# Patient Record
Sex: Male | Born: 1950
Health system: Southern US, Community
[De-identification: ages and names within clinical notes are randomized; demographics above are authoritative.]

## PROBLEM LIST (undated history)

## (undated) DIAGNOSIS — R590 Localized enlarged lymph nodes: Secondary | ICD-10-CM

## (undated) DIAGNOSIS — K219 Gastro-esophageal reflux disease without esophagitis: Secondary | ICD-10-CM

## (undated) DIAGNOSIS — H3320 Serous retinal detachment, unspecified eye: Secondary | ICD-10-CM

## (undated) DIAGNOSIS — I4891 Unspecified atrial fibrillation: Secondary | ICD-10-CM

## (undated) DIAGNOSIS — C8290 Follicular lymphoma, unspecified, unspecified site: Secondary | ICD-10-CM

## (undated) DIAGNOSIS — I10 Essential (primary) hypertension: Secondary | ICD-10-CM

## (undated) DIAGNOSIS — N529 Male erectile dysfunction, unspecified: Secondary | ICD-10-CM

## (undated) HISTORY — DX: Gastro-esophageal reflux disease without esophagitis: K21.9

## (undated) HISTORY — DX: Localized enlarged lymph nodes: R59.0

## (undated) HISTORY — DX: Male erectile dysfunction, unspecified: N52.9

## (undated) HISTORY — DX: Essential (primary) hypertension: I10

## (undated) HISTORY — DX: Serous retinal detachment, unspecified eye: H33.20

## (undated) HISTORY — DX: Follicular lymphoma, unspecified, unspecified site: C82.90

## (undated) HISTORY — PX: OTHER SURGICAL HISTORY: SHX169

## (undated) HISTORY — PX: ABSCESS DRAINAGE: SHX1119

## (undated) HISTORY — DX: Unspecified atrial fibrillation: I48.91

## (undated) HISTORY — PX: VASECTOMY: SHX75

---

## 2000-01-06 ENCOUNTER — Encounter (INDEPENDENT_AMBULATORY_CARE_PROVIDER_SITE_OTHER): Payer: Self-pay | Admitting: Specialist

## 2000-01-06 ENCOUNTER — Other Ambulatory Visit: Admission: RE | Admit: 2000-01-06 | Discharge: 2000-01-06 | Payer: Self-pay | Admitting: Gastroenterology

## 2004-09-17 ENCOUNTER — Ambulatory Visit: Payer: Self-pay | Admitting: Internal Medicine

## 2004-10-13 ENCOUNTER — Ambulatory Visit: Payer: Self-pay | Admitting: Internal Medicine

## 2006-01-02 ENCOUNTER — Ambulatory Visit: Payer: Self-pay | Admitting: Family Medicine

## 2006-01-18 ENCOUNTER — Ambulatory Visit: Payer: Self-pay | Admitting: Family Medicine

## 2006-01-31 ENCOUNTER — Ambulatory Visit: Payer: Self-pay | Admitting: Gastroenterology

## 2006-02-14 ENCOUNTER — Ambulatory Visit: Payer: Self-pay | Admitting: Gastroenterology

## 2006-02-14 ENCOUNTER — Encounter (INDEPENDENT_AMBULATORY_CARE_PROVIDER_SITE_OTHER): Payer: Self-pay | Admitting: *Deleted

## 2006-02-14 LAB — HM COLONOSCOPY

## 2006-03-09 ENCOUNTER — Ambulatory Visit: Payer: Self-pay | Admitting: Internal Medicine

## 2006-04-24 ENCOUNTER — Ambulatory Visit: Payer: Self-pay | Admitting: Family Medicine

## 2006-04-24 LAB — CONVERTED CEMR LAB
CO2: 33 meq/L — ABNORMAL HIGH (ref 19–32)
Calcium: 9.4 mg/dL (ref 8.4–10.5)
Chloride: 104 meq/L (ref 96–112)
Creatinine, Ser: 1 mg/dL (ref 0.4–1.5)
Glucose, Bld: 97 mg/dL (ref 70–99)

## 2006-05-08 ENCOUNTER — Ambulatory Visit: Payer: Self-pay | Admitting: Family Medicine

## 2007-02-27 ENCOUNTER — Encounter (INDEPENDENT_AMBULATORY_CARE_PROVIDER_SITE_OTHER): Payer: Self-pay | Admitting: *Deleted

## 2007-02-27 ENCOUNTER — Ambulatory Visit: Payer: Self-pay | Admitting: Family Medicine

## 2007-02-27 DIAGNOSIS — M25569 Pain in unspecified knee: Secondary | ICD-10-CM | POA: Insufficient documentation

## 2007-02-27 DIAGNOSIS — E1159 Type 2 diabetes mellitus with other circulatory complications: Secondary | ICD-10-CM | POA: Insufficient documentation

## 2007-02-27 DIAGNOSIS — I10 Essential (primary) hypertension: Secondary | ICD-10-CM | POA: Insufficient documentation

## 2007-03-15 ENCOUNTER — Ambulatory Visit: Payer: Self-pay | Admitting: Family Medicine

## 2007-03-15 DIAGNOSIS — F528 Other sexual dysfunction not due to a substance or known physiological condition: Secondary | ICD-10-CM | POA: Insufficient documentation

## 2007-07-25 ENCOUNTER — Ambulatory Visit: Payer: Self-pay | Admitting: Family Medicine

## 2007-07-25 DIAGNOSIS — K219 Gastro-esophageal reflux disease without esophagitis: Secondary | ICD-10-CM | POA: Insufficient documentation

## 2007-07-26 ENCOUNTER — Encounter (INDEPENDENT_AMBULATORY_CARE_PROVIDER_SITE_OTHER): Payer: Self-pay | Admitting: *Deleted

## 2007-07-31 ENCOUNTER — Encounter (INDEPENDENT_AMBULATORY_CARE_PROVIDER_SITE_OTHER): Payer: Self-pay | Admitting: *Deleted

## 2007-08-02 ENCOUNTER — Ambulatory Visit: Payer: Self-pay | Admitting: Family Medicine

## 2007-08-06 ENCOUNTER — Encounter (INDEPENDENT_AMBULATORY_CARE_PROVIDER_SITE_OTHER): Payer: Self-pay | Admitting: *Deleted

## 2008-07-07 ENCOUNTER — Ambulatory Visit: Payer: Self-pay | Admitting: Family Medicine

## 2008-07-07 DIAGNOSIS — R599 Enlarged lymph nodes, unspecified: Secondary | ICD-10-CM | POA: Insufficient documentation

## 2008-07-20 LAB — CONVERTED CEMR LAB
Basophils Relative: 0 % (ref 0.0–3.0)
Eosinophils Absolute: 0.3 10*3/uL (ref 0.0–0.7)
Eosinophils Relative: 5.1 % — ABNORMAL HIGH (ref 0.0–5.0)
HCT: 43.1 % (ref 39.0–52.0)
Hemoglobin: 14.9 g/dL (ref 13.0–17.0)
MCV: 91 fL (ref 78.0–100.0)
Monocytes Absolute: 1.1 10*3/uL — ABNORMAL HIGH (ref 0.1–1.0)
Monocytes Relative: 17.7 % — ABNORMAL HIGH (ref 3.0–12.0)
Neutro Abs: 2.3 10*3/uL (ref 1.4–7.7)
Platelets: 265 10*3/uL (ref 150–400)
RBC: 4.74 M/uL (ref 4.22–5.81)
WBC: 6.3 10*3/uL (ref 4.5–10.5)

## 2008-07-21 ENCOUNTER — Ambulatory Visit: Payer: Self-pay | Admitting: Family Medicine

## 2008-07-21 ENCOUNTER — Encounter (INDEPENDENT_AMBULATORY_CARE_PROVIDER_SITE_OTHER): Payer: Self-pay | Admitting: *Deleted

## 2008-07-21 ENCOUNTER — Ambulatory Visit: Payer: Self-pay | Admitting: Cardiology

## 2008-07-21 ENCOUNTER — Telehealth (INDEPENDENT_AMBULATORY_CARE_PROVIDER_SITE_OTHER): Payer: Self-pay | Admitting: *Deleted

## 2008-07-21 LAB — CONVERTED CEMR LAB
BUN: 14 mg/dL (ref 6–23)
Creatinine, Ser: 0.8 mg/dL (ref 0.4–1.5)

## 2008-08-01 ENCOUNTER — Encounter: Payer: Self-pay | Admitting: Family Medicine

## 2008-08-01 ENCOUNTER — Ambulatory Visit (HOSPITAL_COMMUNITY): Admission: RE | Admit: 2008-08-01 | Discharge: 2008-08-01 | Payer: Self-pay | Admitting: Family Medicine

## 2008-08-07 ENCOUNTER — Telehealth: Payer: Self-pay | Admitting: Family Medicine

## 2008-08-07 DIAGNOSIS — C8590 Non-Hodgkin lymphoma, unspecified, unspecified site: Secondary | ICD-10-CM | POA: Insufficient documentation

## 2008-08-13 ENCOUNTER — Ambulatory Visit: Payer: Self-pay | Admitting: Oncology

## 2008-08-15 ENCOUNTER — Encounter: Payer: Self-pay | Admitting: Family Medicine

## 2008-08-15 LAB — CBC WITH DIFFERENTIAL/PLATELET
Eosinophils Absolute: 0.2 10*3/uL (ref 0.0–0.5)
LYMPH%: 22.5 % (ref 14.0–49.0)
MCHC: 35.3 g/dL (ref 32.0–36.0)
MCV: 84.7 fL (ref 79.3–98.0)
MONO%: 13.5 % (ref 0.0–14.0)
NEUT#: 4.6 10*3/uL (ref 1.5–6.5)
NEUT%: 60.1 % (ref 39.0–75.0)
Platelets: 264 10*3/uL (ref 140–400)
RBC: 5.02 10*6/uL (ref 4.20–5.82)

## 2008-08-19 LAB — SPEP & IFE WITH QIG
Albumin ELP: 61 % (ref 55.8–66.1)
Alpha-1-Globulin: 4.1 % (ref 2.9–4.9)
Alpha-2-Globulin: 10.1 % (ref 7.1–11.8)
Beta Globulin: 5.6 % (ref 4.7–7.2)
Total Protein, Serum Electrophoresis: 6.7 g/dL (ref 6.0–8.3)

## 2008-08-19 LAB — KAPPA/LAMBDA LIGHT CHAINS: Kappa free light chain: 0.64 mg/dL (ref 0.33–1.94)

## 2008-08-19 LAB — COMPREHENSIVE METABOLIC PANEL
Alkaline Phosphatase: 71 U/L (ref 39–117)
Creatinine, Ser: 0.93 mg/dL (ref 0.40–1.50)
Glucose, Bld: 100 mg/dL — ABNORMAL HIGH (ref 70–99)
Sodium: 142 mEq/L (ref 135–145)
Total Bilirubin: 0.5 mg/dL (ref 0.3–1.2)
Total Protein: 6.7 g/dL (ref 6.0–8.3)

## 2008-08-20 ENCOUNTER — Ambulatory Visit (HOSPITAL_COMMUNITY): Admission: RE | Admit: 2008-08-20 | Discharge: 2008-08-20 | Payer: Self-pay | Admitting: Oncology

## 2008-08-26 ENCOUNTER — Encounter: Payer: Self-pay | Admitting: Family Medicine

## 2008-09-05 ENCOUNTER — Encounter: Payer: Self-pay | Admitting: Family Medicine

## 2008-09-09 ENCOUNTER — Encounter (INDEPENDENT_AMBULATORY_CARE_PROVIDER_SITE_OTHER): Payer: Self-pay | Admitting: *Deleted

## 2008-09-09 ENCOUNTER — Telehealth: Payer: Self-pay | Admitting: Family Medicine

## 2008-09-09 ENCOUNTER — Encounter: Payer: Self-pay | Admitting: Family Medicine

## 2008-09-09 DIAGNOSIS — I4891 Unspecified atrial fibrillation: Secondary | ICD-10-CM | POA: Insufficient documentation

## 2008-09-10 ENCOUNTER — Ambulatory Visit: Payer: Self-pay | Admitting: Family Medicine

## 2008-09-12 ENCOUNTER — Encounter (INDEPENDENT_AMBULATORY_CARE_PROVIDER_SITE_OTHER): Payer: Self-pay | Admitting: *Deleted

## 2008-09-17 ENCOUNTER — Ambulatory Visit: Payer: Self-pay | Admitting: Internal Medicine

## 2008-09-17 ENCOUNTER — Encounter: Payer: Self-pay | Admitting: Internal Medicine

## 2008-09-17 ENCOUNTER — Encounter (INDEPENDENT_AMBULATORY_CARE_PROVIDER_SITE_OTHER): Payer: Self-pay | Admitting: *Deleted

## 2008-09-19 ENCOUNTER — Encounter: Payer: Self-pay | Admitting: Family Medicine

## 2008-09-19 ENCOUNTER — Ambulatory Visit: Payer: Self-pay | Admitting: Oncology

## 2008-09-19 ENCOUNTER — Encounter: Payer: Self-pay | Admitting: Internal Medicine

## 2008-09-23 ENCOUNTER — Telehealth (INDEPENDENT_AMBULATORY_CARE_PROVIDER_SITE_OTHER): Payer: Self-pay | Admitting: *Deleted

## 2008-09-29 ENCOUNTER — Telehealth: Payer: Self-pay | Admitting: Internal Medicine

## 2008-10-01 ENCOUNTER — Encounter (INDEPENDENT_AMBULATORY_CARE_PROVIDER_SITE_OTHER): Payer: Self-pay | Admitting: *Deleted

## 2008-10-01 ENCOUNTER — Encounter: Payer: Self-pay | Admitting: Family Medicine

## 2008-10-03 ENCOUNTER — Ambulatory Visit: Payer: Self-pay

## 2008-10-03 ENCOUNTER — Encounter: Payer: Self-pay | Admitting: Internal Medicine

## 2008-10-14 ENCOUNTER — Encounter: Payer: Self-pay | Admitting: Family Medicine

## 2008-10-15 ENCOUNTER — Telehealth: Payer: Self-pay | Admitting: Internal Medicine

## 2008-10-22 ENCOUNTER — Ambulatory Visit: Payer: Self-pay | Admitting: Internal Medicine

## 2008-10-22 ENCOUNTER — Encounter (INDEPENDENT_AMBULATORY_CARE_PROVIDER_SITE_OTHER): Payer: Self-pay | Admitting: *Deleted

## 2008-11-20 ENCOUNTER — Ambulatory Visit: Payer: Self-pay | Admitting: Internal Medicine

## 2009-01-09 ENCOUNTER — Ambulatory Visit: Payer: Self-pay | Admitting: Oncology

## 2009-01-13 ENCOUNTER — Encounter: Payer: Self-pay | Admitting: Family Medicine

## 2009-01-13 ENCOUNTER — Encounter: Payer: Self-pay | Admitting: Internal Medicine

## 2009-01-13 LAB — COMPREHENSIVE METABOLIC PANEL
AST: 19 U/L (ref 0–37)
Albumin: 4.4 g/dL (ref 3.5–5.2)
Alkaline Phosphatase: 62 U/L (ref 39–117)
Potassium: 4.1 mEq/L (ref 3.5–5.3)
Sodium: 139 mEq/L (ref 135–145)
Total Protein: 7 g/dL (ref 6.0–8.3)

## 2009-01-13 LAB — CBC WITH DIFFERENTIAL/PLATELET
EOS%: 2 % (ref 0.0–7.0)
MCH: 31.3 pg (ref 27.2–33.4)
MCV: 89.7 fL (ref 79.3–98.0)
MONO%: 12.8 % (ref 0.0–14.0)
RBC: 4.73 10*6/uL (ref 4.20–5.82)
RDW: 13.4 % (ref 11.0–14.6)

## 2009-04-13 ENCOUNTER — Ambulatory Visit: Payer: Self-pay | Admitting: Internal Medicine

## 2009-05-13 ENCOUNTER — Ambulatory Visit: Payer: Self-pay | Admitting: Oncology

## 2009-05-15 ENCOUNTER — Encounter: Payer: Self-pay | Admitting: Internal Medicine

## 2009-05-15 LAB — CBC WITH DIFFERENTIAL/PLATELET
Basophils Absolute: 0.1 10*3/uL (ref 0.0–0.1)
EOS%: 2.9 % (ref 0.0–7.0)
Eosinophils Absolute: 0.2 10*3/uL (ref 0.0–0.5)
HGB: 14.4 g/dL (ref 13.0–17.1)
NEUT#: 4.9 10*3/uL (ref 1.5–6.5)
RDW: 13.1 % (ref 11.0–14.6)
lymph#: 1.9 10*3/uL (ref 0.9–3.3)

## 2009-05-15 LAB — COMPREHENSIVE METABOLIC PANEL
AST: 19 U/L (ref 0–37)
Albumin: 4.4 g/dL (ref 3.5–5.2)
BUN: 18 mg/dL (ref 6–23)
Calcium: 9.9 mg/dL (ref 8.4–10.5)
Chloride: 102 mEq/L (ref 96–112)
Glucose, Bld: 97 mg/dL (ref 70–99)
Potassium: 4.5 mEq/L (ref 3.5–5.3)
Sodium: 140 mEq/L (ref 135–145)
Total Protein: 6.9 g/dL (ref 6.0–8.3)

## 2009-05-30 HISTORY — PX: DEEP NECK LYMPH NODE BIOPSY / EXCISION: SUR126

## 2009-07-10 ENCOUNTER — Ambulatory Visit: Payer: Self-pay | Admitting: Internal Medicine

## 2009-07-17 ENCOUNTER — Encounter (INDEPENDENT_AMBULATORY_CARE_PROVIDER_SITE_OTHER): Payer: Self-pay | Admitting: *Deleted

## 2009-08-03 ENCOUNTER — Encounter (INDEPENDENT_AMBULATORY_CARE_PROVIDER_SITE_OTHER): Payer: Self-pay | Admitting: *Deleted

## 2009-08-03 ENCOUNTER — Ambulatory Visit: Payer: Self-pay | Admitting: Family Medicine

## 2009-08-04 ENCOUNTER — Encounter: Payer: Self-pay | Admitting: Family Medicine

## 2009-08-17 ENCOUNTER — Ambulatory Visit: Payer: Self-pay | Admitting: Family Medicine

## 2009-08-28 ENCOUNTER — Ambulatory Visit: Payer: Self-pay | Admitting: Oncology

## 2009-09-01 ENCOUNTER — Ambulatory Visit (HOSPITAL_COMMUNITY): Admission: RE | Admit: 2009-09-01 | Discharge: 2009-09-01 | Payer: Self-pay | Admitting: Oncology

## 2009-09-01 LAB — CBC WITH DIFFERENTIAL/PLATELET
BASO%: 0.4 % (ref 0.0–2.0)
EOS%: 3.3 % (ref 0.0–7.0)
HCT: 43.9 % (ref 38.4–49.9)
LYMPH%: 20 % (ref 14.0–49.0)
MCH: 31.4 pg (ref 27.2–33.4)
MCHC: 34.7 g/dL (ref 32.0–36.0)
MONO%: 12.7 % (ref 0.0–14.0)
NEUT%: 63.6 % (ref 39.0–75.0)
Platelets: 248 10*3/uL (ref 140–400)

## 2009-09-02 LAB — COMPREHENSIVE METABOLIC PANEL
ALT: 26 U/L (ref 0–53)
AST: 26 U/L (ref 0–37)
Alkaline Phosphatase: 60 U/L (ref 39–117)
Creatinine, Ser: 1.04 mg/dL (ref 0.40–1.50)
Total Bilirubin: 0.8 mg/dL (ref 0.3–1.2)

## 2009-09-04 ENCOUNTER — Encounter: Payer: Self-pay | Admitting: Family Medicine

## 2009-10-07 ENCOUNTER — Ambulatory Visit: Payer: Self-pay | Admitting: Internal Medicine

## 2009-10-08 ENCOUNTER — Telehealth: Payer: Self-pay | Admitting: Internal Medicine

## 2009-12-07 ENCOUNTER — Ambulatory Visit: Payer: Self-pay | Admitting: Oncology

## 2009-12-09 ENCOUNTER — Encounter: Payer: Self-pay | Admitting: Family Medicine

## 2009-12-09 LAB — CBC WITH DIFFERENTIAL/PLATELET
BASO%: 0.5 % (ref 0.0–2.0)
LYMPH%: 20 % (ref 14.0–49.0)
MCHC: 34.8 g/dL (ref 32.0–36.0)
MONO#: 1 10*3/uL — ABNORMAL HIGH (ref 0.1–0.9)
MONO%: 12.6 % (ref 0.0–14.0)
Platelets: 259 10*3/uL (ref 140–400)
RBC: 4.6 10*6/uL (ref 4.20–5.82)
RDW: 13.4 % (ref 11.0–14.6)
WBC: 7.6 10*3/uL (ref 4.0–10.3)

## 2009-12-09 LAB — COMPREHENSIVE METABOLIC PANEL
ALT: 22 U/L (ref 0–53)
Alkaline Phosphatase: 55 U/L (ref 39–117)
CO2: 26 mEq/L (ref 19–32)
Potassium: 3.8 mEq/L (ref 3.5–5.3)
Sodium: 138 mEq/L (ref 135–145)
Total Bilirubin: 1.1 mg/dL (ref 0.3–1.2)
Total Protein: 6.7 g/dL (ref 6.0–8.3)

## 2010-04-08 ENCOUNTER — Ambulatory Visit: Payer: Self-pay | Admitting: Oncology

## 2010-04-12 ENCOUNTER — Ambulatory Visit (HOSPITAL_COMMUNITY)
Admission: RE | Admit: 2010-04-12 | Discharge: 2010-04-12 | Payer: Self-pay | Source: Home / Self Care | Admitting: Oncology

## 2010-04-14 ENCOUNTER — Encounter: Payer: Self-pay | Admitting: Internal Medicine

## 2010-04-26 ENCOUNTER — Ambulatory Visit: Payer: Self-pay | Admitting: Internal Medicine

## 2010-04-26 ENCOUNTER — Encounter (INDEPENDENT_AMBULATORY_CARE_PROVIDER_SITE_OTHER): Payer: Self-pay | Admitting: *Deleted

## 2010-06-20 ENCOUNTER — Encounter: Payer: Self-pay | Admitting: Oncology

## 2010-06-27 LAB — CONVERTED CEMR LAB
ALT: 25 units/L (ref 0–53)
ALT: 28 units/L (ref 0–53)
Albumin: 4.1 g/dL (ref 3.5–5.2)
Alkaline Phosphatase: 62 units/L (ref 39–117)
BUN: 13 mg/dL (ref 6–23)
BUN: 15 mg/dL (ref 6–23)
Basophils Absolute: 0.1 10*3/uL (ref 0.0–0.1)
Basophils Absolute: 0.1 10*3/uL (ref 0.0–0.1)
Basophils Relative: 1.2 % — ABNORMAL HIGH (ref 0.0–1.0)
Bilirubin, Direct: 0.1 mg/dL (ref 0.0–0.3)
CO2: 32 meq/L (ref 19–32)
Calcium: 9.5 mg/dL (ref 8.4–10.5)
Cholesterol: 136 mg/dL (ref 0–200)
Creatinine, Ser: 1 mg/dL (ref 0.4–1.5)
Eosinophils Relative: 3.2 % (ref 0.0–5.0)
Free T4: 0.9 ng/dL (ref 0.6–1.6)
GFR calc Af Amer: 113 mL/min
GFR calc non Af Amer: 81.79 mL/min (ref >60)
Glucose, Bld: 109 mg/dL — ABNORMAL HIGH (ref 70–99)
HCT: 45.3 % (ref 39.0–52.0)
HDL: 32.1 mg/dL — ABNORMAL LOW (ref >39.0)
Ketones, urine, test strip: NEGATIVE
LDL Cholesterol: 84 mg/dL (ref 0–99)
LDL Cholesterol: 86 mg/dL (ref 0–99)
Lymphocytes Relative: 23.8 % (ref 12.0–46.0)
Lymphs Abs: 1.5 10*3/uL (ref 0.7–4.0)
MCV: 91.6 fL (ref 78.0–100.0)
Monocytes Absolute: 0.8 10*3/uL (ref 0.1–1.0)
Monocytes Relative: 10.6 % (ref 3.0–12.0)
Monocytes Relative: 11.1 % — ABNORMAL HIGH (ref 3.0–11.0)
Neutro Abs: 4.9 10*3/uL (ref 1.4–7.7)
Neutrophils Relative %: 65.8 % (ref 43.0–77.0)
Nitrite: NEGATIVE
PSA: 0.35 ng/mL (ref 0.10–4.00)
Platelets: 224 10*3/uL (ref 150.0–400.0)
Platelets: 310 10*3/uL (ref 150–400)
RDW: 12.5 % (ref 11.5–14.6)
RDW: 12.7 % (ref 11.5–14.6)
TSH: 1.47 microintl units/mL (ref 0.35–5.50)
Total Bilirubin: 0.4 mg/dL (ref 0.3–1.2)
Total Protein: 7 g/dL (ref 6.0–8.3)
Triglycerides: 53 mg/dL (ref 0.0–149.0)
Triglycerides: 55 mg/dL (ref 0–149)
Urobilinogen, UA: NEGATIVE
VLDL: 10.6 mg/dL (ref 0.0–40.0)
VLDL: 11 mg/dL (ref 0–40)
WBC: 7.7 10*3/uL (ref 4.5–10.5)

## 2010-06-29 IMAGING — CT CT NECK W/ CM
3 of 4 series · 15 of 33 positions shown, 18 images · IV contrast (agent unspecified)
Comparison: None.

CLINICAL DATA: 57-year-old male with cervical lymphadenopathy.
Bilateral angle of mandible soft tissue masses.  No change on
antibiotics.

CT NECK WITH CONTRAST
TECHNIQUE: Multidetector CT imaging of the neck was performed with
intravenous contrast.
Contrast: 80 ml Rmnipaque-GTT.

[Series 2: neck_routine 3.0 · axial · 0.44mm/px · z∈[-232,-58]mm · 7 of 74 slices shown, 9 images]
[im 8/74  soft-tissue]
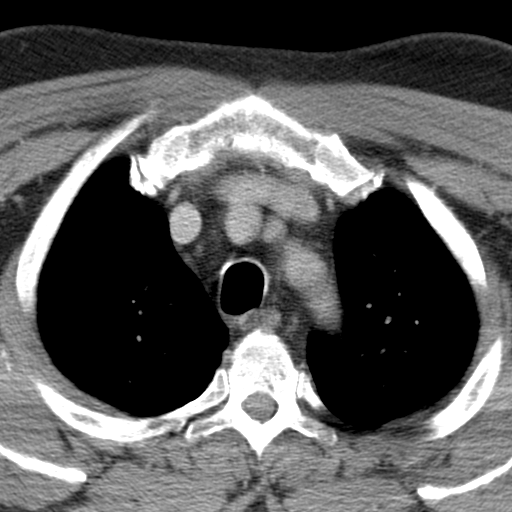
[im 8/74  bone]
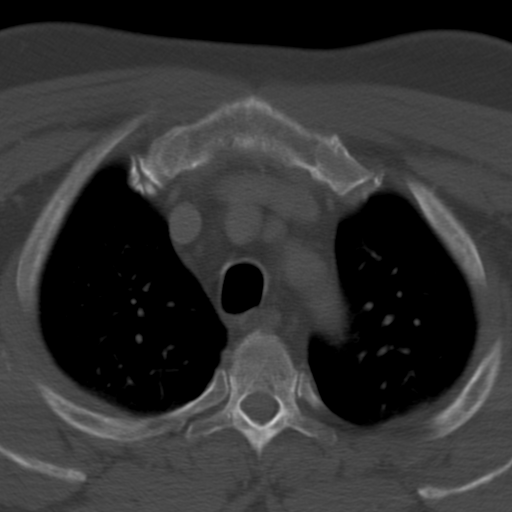
[im 15/74  bone]
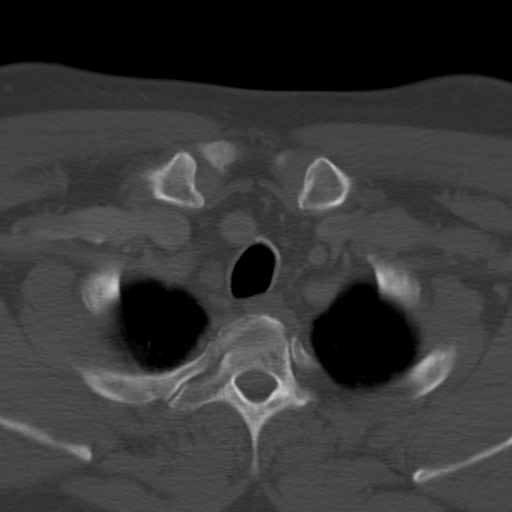
[im 30/74  bone]
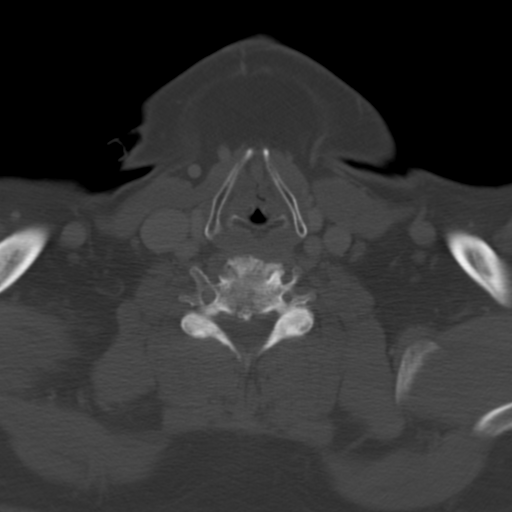
[im 37/74  bone]
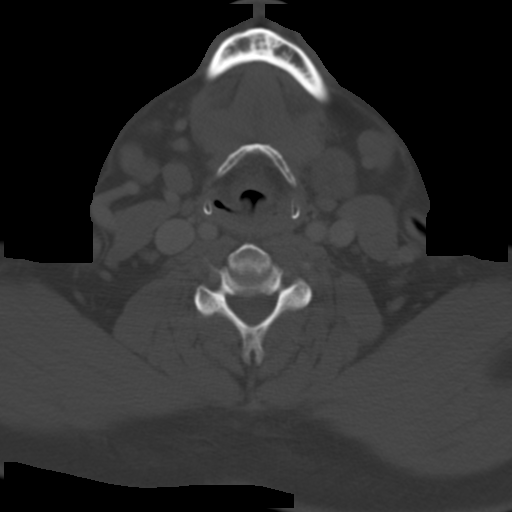
[im 44/74  soft-tissue]
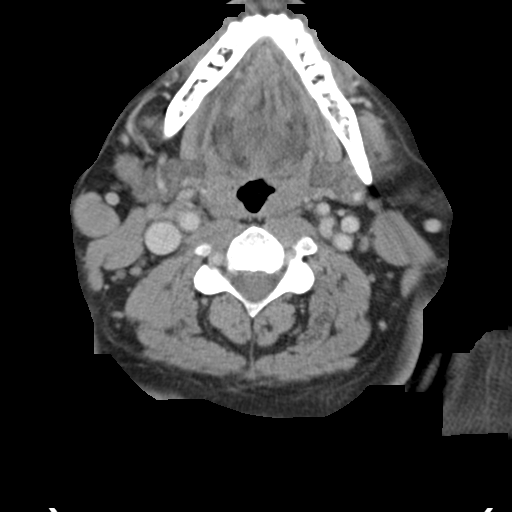
[im 44/74  bone]
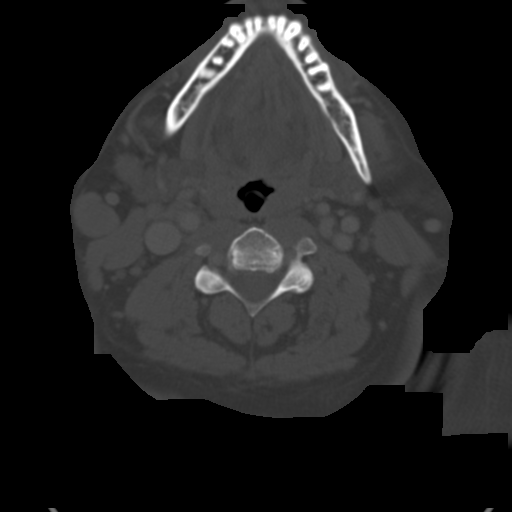
[im 59/74  bone]
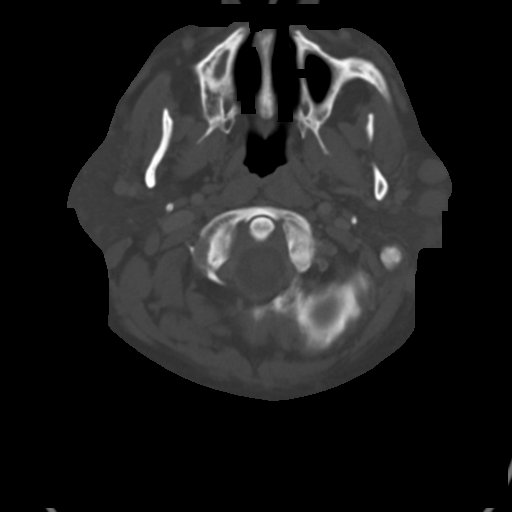
[im 66/74  bone]
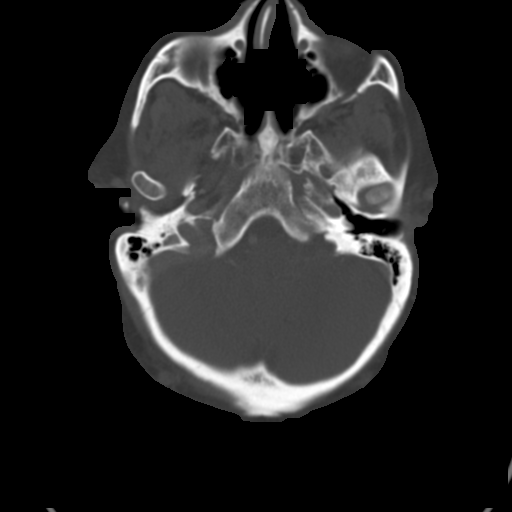

[Series 602: <mpr range> · coronal · 0.44mm/px · 3 of 105 slices shown]
[im 21/105  bone]
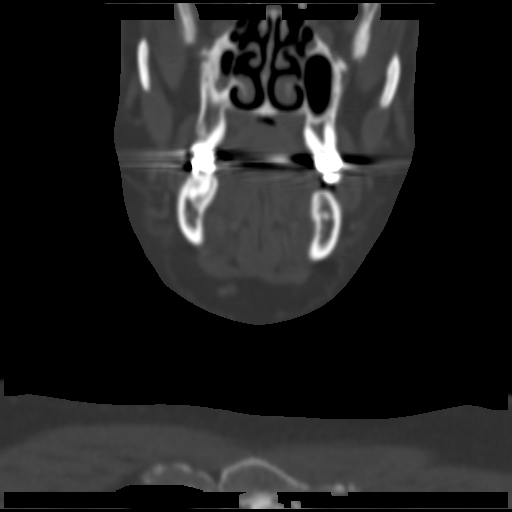
[im 42/105  bone]
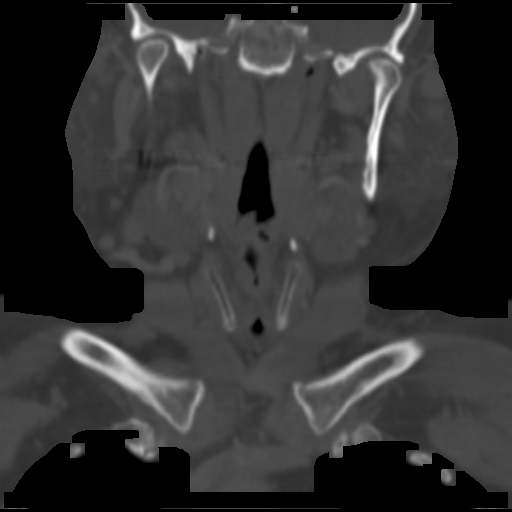
[im 63/105  bone]
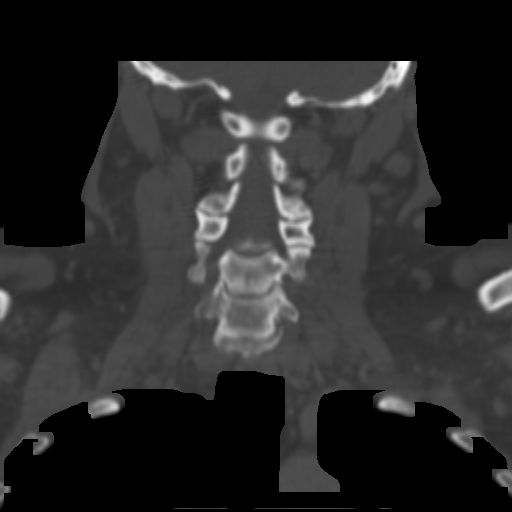

[Series 603: <mpr range(1)> · sagittal · 0.44mm/px · 5 of 109 slices shown, 6 images]
[im 37/109  bone]
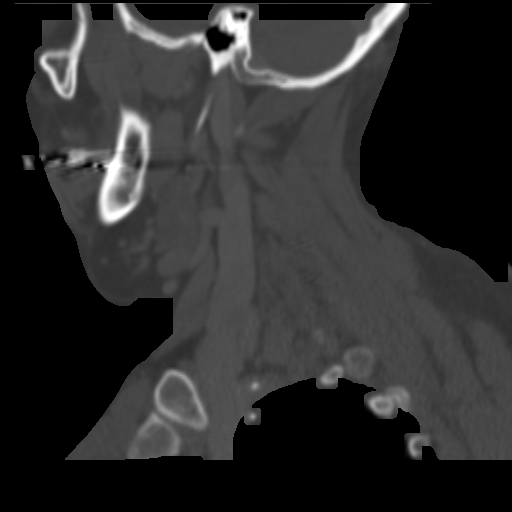
[im 46/109  bone]
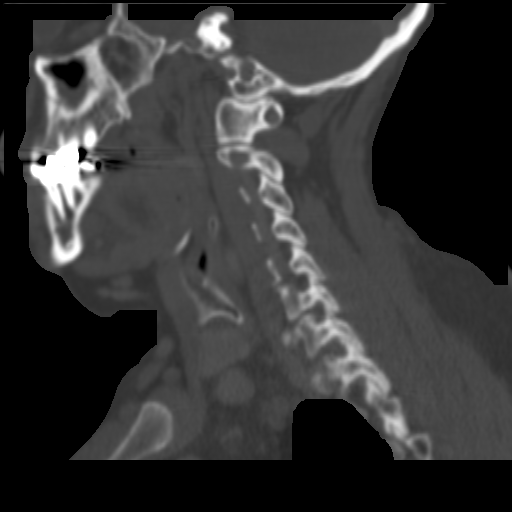
[im 55/109  soft-tissue]
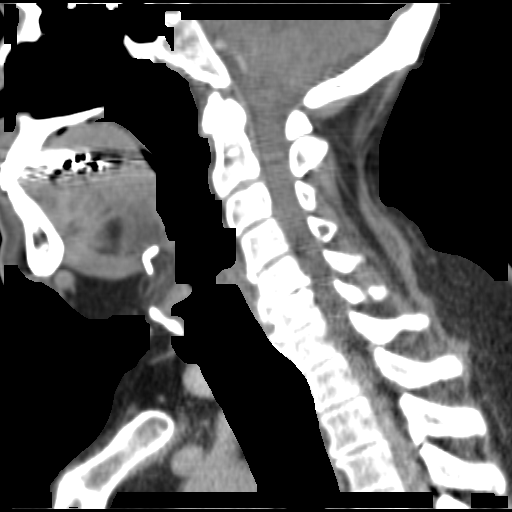
[im 55/109  bone]
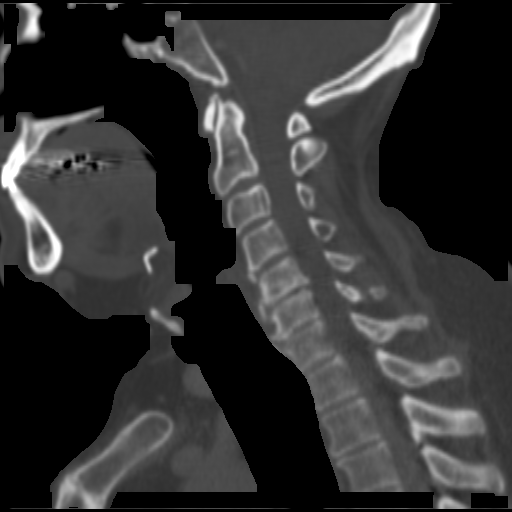
[im 64/109  bone]
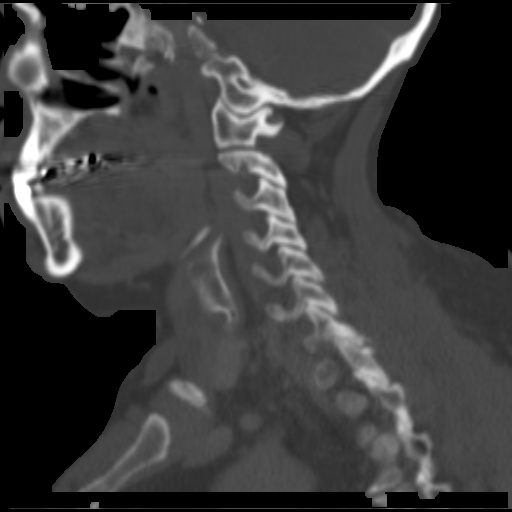
[im 73/109  bone]
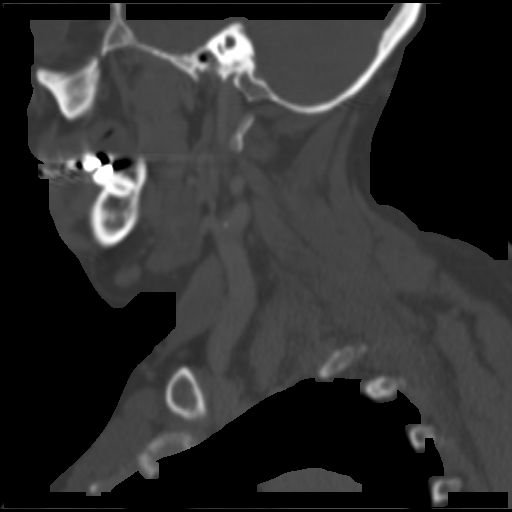

[15 of 33 positions shown; findings below may reference images not displayed]

FINDINGS: Visualized lung apices are clear aside from minor
dependent atelectasis.  Major vascular structures in the neck
appear patent with mild left carotid bifurcation atherosclerosis.
Visualized brain parenchyma, orbits and scalp are within normal
limits.  Multilevel degenerative changes in the cervical spine
including facet, uncovertebral degeneration and multilevel disc
osteophyte complexes.  Mild mucosal thickening in the maxillary
sinuses, right greater than left.  Mucoperiosteal thickening in the
right maxillary sinus which is hypoplastic suggest chronic disease.
Mastoids are clear. No acute osseous abnormality identified.

The right thyroid lobe is within normal limits.  The left lobe near
the isthmus is abnormal with a 12 x 15 mm hypodense nodule.  The
glottis is closed.  The larynx is normal aside from effacement of
the left piriform sinus.  The epiglottis is mildly thickened.
There is bilateral, symmetric tonsillar pillar hypertrophy.  The
sublingual space, retropharyngeal space and bilateral
parapharyngeal spaces are within normal limits.  There is mild
adenoid hypertrophy.  The submandibular glands are within normal
limits.

There is diffuse cervical lymphadenopathy including left greater
than right intraparotid nodes, and the large bilateral level IB and
level II nodes.  The largest nodes measure 16-17 mm in short axis,
with numerous nodes larger than 10 mm identified.  No necrotic
lymphadenopathy.  There is an increased number of these sub
centimeter nodes seen at other nodal stations throughout the neck
and at the thoracic inlet.  There is a maximal right paratracheal
lymph node.  Visualized superior mediastinum is otherwise
relatively spared.  No soft tissue or subcutaneous fat inflammatory
changes.
IMPRESSION: 1.  Diffuse cervical lymphadenopathy with the most pronounced
involvement at level I and II bilaterally as well as left greater
than right intraparotid nodes.
2.  Tonsillar hypertrophy.  Mild thickening of the epiglottis and
effacement of the left piriform sinus without discrete pharyngeal
mass.

3.  Left thyroid nodule measures 12 x 15 mm.
4.  Visualized superior mediastinum and lung apices are within
normal limits.
5.  Constellation of findings is most suggestive of
lymphoma/leukemia (with differential including infection and
metastatic disease).  Lymph node needle biopsy for histologic
correlation is recommended.

I called the above to Chyki Barazorda in Dr. [REDACTED]
by telephone at 2323 hours on 07/21/2008.

## 2010-06-29 NOTE — Progress Notes (Signed)
Summary: refill  Phone Note Call from Patient Call back at Home Phone [phone] Message from:  Patient on Oct 08, 2009 9:29 AM  Refills Requested: Medication #1:  TOPROL XL 50 MG XR24H-TAB one by mouth once daily. [facility]   Caller: Patient Summary of Call: Pt calling reagrdingToprol 50mg  Initial call taken by: Judie Grieve,  Oct 08, 2009 9:30 AM  Follow-up for Phone Call        10/09/09--4:30pm--pt's wife calling stating dr [name] increased toprol succinate to 50mg  once daily--kelly lanier states she thought dr [name] stated to renew meds,so she sent RX for toprol succinate 25mg  (original dose)--i informed wife i would e-scribe the 50mg  toprol succ [facility] [address], but only want pt to take 25mg  toprol succ until they hear from [facility] on mon 5/16--they may cut 50mg  tab in half--dr Martinez Boxx did say in note that he wanted to titrate beta blocker up--kelly states she will f/u on 5/16--nt Follow-up by: Ledon Snare, RN,  Oct 09, 2009 4:33 PM    New/Updated Medications: METOPROLOL SUCCINATE 50 MG XR24H-TAB (METOPROLOL SUCCINATE) Take one tablet by mouth daily Prescriptions: METOPROLOL SUCCINATE 50 MG XR24H-TAB (METOPROLOL SUCCINATE) Take one tablet by mouth daily  #30 x 11   Entered by:   Ledon Snare, RN   Authorized by:   Laren Boom, MD, [facility]   Signed by:   Ledon Snare, RN on 10/09/2009   Method used:   Electronically to        [facility] (retail)       [address]       [place], [place]  [address]       Ph: [phone]       Fax: [phone]   RxID:   [id number]

## 2010-06-29 NOTE — Assessment & Plan Note (Signed)
Summary: 3 MONTH ROV./SL   Primary Provider:  Laury Axon   History of Present Illness: Tom Fuller returns today for followup of his atrial fibrillation.  He is a pleasant 60 yo man with a h/o atrial fibrillation and a low grade lymphoma.  He was seen by me several months ago with newly diagnosed atrial fibrillation as part of a pre-op screening.  He is minimally symptomatic from the perspective of palpitations.  His weakness is improved.  He denies c/p or sob.  He is being managed conservatively for now as his lymphoma is felt to be low grade.  He underwent holter monitoring which demonstrated periods of rapid ventricular response in atrial fibrillation and was started on Toprol.  He denies c/p.  He has minimal dyspnea with exertion.  Current Medications (verified): 1)  Diovan Hct 320-12.5 Mg  Tabs (Valsartan-Hydrochlorothiazide) .Marland Kitchen.. 1 By Mouth Once Daily 2)  Nexium 40 Mg  Cpdr (Esomeprazole Magnesium) .Marland Kitchen.. 1 By Mouth Once Daily 3)  Aspirin Ec 325 Mg Tbec (Aspirin) .... Take One Tablet By Mouth Daily 4)  Fish Oil 1000 Mg  Caps (Omega-3 Fatty Acids) .... 2 By Mouth Two Times A Day 5)  Celebrex 200 Mg Caps (Celecoxib) .... Take One Tablet By Mouth Once Daily. 6)  Toprol Xl 25 Mg Xr24h-Tab (Metoprolol Succinate) .... One By Mouth Once Daily  Allergies (verified): No Known Drug Allergies  Past History:  Past Medical History: Last updated: 10/22/2008 ATRIAL FIBRILLATION (ICD-427.31) LYMPHOMA (ICD-202.80), FOLLICULAR , Dr Clelia Croft CERVICAL LYMPHADENOPATHY [phone]) PREVENTIVE HEALTH CARE (ICD-V70.0) FAMILY HISTORY DIABETES 1ST DEGREE RELATIVE (ICD-V18.0) GERD (ICD-530.81) ERECTILE DYSFUNCTION (ICD-302.72) HYPERTENSION (ICD-401.9) KNEE PAIN, RIGHT, ACUTE (EAV-409.81)  Past Surgical History: Last updated: 08/03/2009 Vasectomy (1985) bx lymph node--  08/2008  Review of Systems  The patient denies chest pain, syncope, dyspnea on exertion, and peripheral edema.    Vital Signs:  Patient  profile:   60 year old male Height:      70 inches Weight:      259 pounds BMI:     37.30 Pulse rate:   92 / minute BP sitting:   108 / 80  (left arm)  Vitals Entered By: Laurance Flatten CMA (Oct 07, 2009 3:50 PM)  Physical Exam  General:  Well-developed,well-nourished,in no acute distress; alert,appropriate and cooperative throughout examination Head:  Normocephalic and atraumatic without obvious abnormalities. No apparent alopecia or balding. Eyes:  pupils equal, pupils round, pupils reactive to light, and no injection.   Mouth:  Oral mucosa and oropharynx without lesions or exudates.  Teeth in good repair. Neck:  b/l cervical lymphadenopathy Lungs:  Normal respiratory effort, chest expands symmetrically. Lungs are clear to auscultation, no crackles or wheezes. Heart:  irregular , irregular Abdomen:  Bowel sounds positive,abdomen soft and non-tender without masses, organomegaly or hernias noted.   Impression & Recommendations:  Problem # 1:  ATRIAL FIBRILLATION (ICD-427.31) He appears to be tolerating his fibrillation reasonably well.  I plan to uptitrate his beta blockers today and see him back in several months. His updated medication list for this problem includes:    Aspirin Ec 325 Mg Tbec (Aspirin) .Marland Kitchen... Take one tablet by mouth daily    Toprol Xl 25 Mg Xr24h-tab (Metoprolol succinate) ..... One by mouth once daily  Problem # 2:  HYPERTENSION (ICD-401.9) His blood pressure appears to be well controlled. A low sodium diet is recommended. His updated medication list for this problem includes:    Diovan Hct 320-12.5 Mg Tabs (Valsartan-hydrochlorothiazide) .Marland Kitchen... 1 by mouth once  daily    Aspirin Ec 325 Mg Tbec (Aspirin) .Marland Kitchen... Take one tablet by mouth daily    Toprol Xl 25 Mg Xr24h-tab (Metoprolol succinate) ..... One by mouth once daily  Patient Instructions: 1)  Your physician recommends that you schedule a follow-up appointment in: 6 months with Dr [name] Prescriptions: TOPROL  XL 25 MG XR24H-TAB (METOPROLOL SUCCINATE) one by mouth once daily  #30 x 11   Entered by:   Dennis Bast, RN, BSN   Authorized by:   Laren Boom, MD, [facility]   Signed by:   Dennis Bast, RN, BSN on 10/07/2009   Method used:   Electronically to        [facility] (retail)       [address]       [place], [place]  [address]       Ph: [phone]       Fax: [phone]   RxID:   [id number] TOPROL XL 25 MG XR24H-TAB (METOPROLOL SUCCINATE) one by mouth once daily  #30 x 11   Entered by:   Dennis Bast, RN, BSN   Authorized by:   Laren Boom, MD, [facility]   Signed by:   Dennis Bast, RN, BSN on 10/07/2009   Method used:   [facility]  [address] [id number]       [address]       [place], [place]  [address]       Ph: [phone] or [phone]       Fax: [phone]   RxID:   [id number]

## 2010-06-29 NOTE — Letter (Signed)
Summary: [facility]  [facility]   Imported By: Lennie Odor 04/23/2010 14:19:46  _____________________________________________________________________  External Attachment:    Type:   Image     Comment:   External Document

## 2010-06-29 NOTE — Letter (Signed)
[facility]: [facility]   Imported By: Lanelle Bal 12/25/2009 09:57:03  _____________________________________________________________________  External Attachment:    Type:   Image     Comment:   External Document

## 2010-06-29 NOTE — Assessment & Plan Note (Signed)
Summary: f26m   Visit Type:  6 month follow up Primary Vannah Nadal:  Laury Axon  CC:  a little fatigued.  History of Present Illness: [name] returns today for followup of his atrial fibrillation.  He is a pleasant 60 yo man with a h/o atrial fibrillation and a low grade lymphoma.  He was seen by me several months ago and after considering the different treatment options, we decided that an initial strategy of rate control was most prudent.   He denies c/p or sob.  He is being managed conservatively for now as his lymphoma is felt to be low grade.  He has not had syncope.  He c/o erectile dysfunction which did not respond to an initial trial of drugs.  Problems Prior to Update: 1)  Preventive Health Care  (ICD-V70.0) 2)  Knee Pain, Left, Acute  (ICD-719.46) 3)  Atrial Fibrillation  (ICD-427.31) 4)  Lymphoma  (ICD-202.80) 5)  Cervical Lymphadenopathy  (ICD-785.6) 6)  Family History Diabetes 1st Degree Relative  (ICD-V18.0) 7)  Gerd  (ICD-530.81) 8)  Erectile Dysfunction  (ICD-302.72) 9)  Hypertension  (ICD-401.9) 10)  Knee Pain, Right, Acute  (ICD-719.46)  Current Medications (verified): 1)  Diovan Hct 320-12.5 Mg  Tabs (Valsartan-Hydrochlorothiazide) .Marland Kitchen.. 1 By Mouth Once Daily 2)  Nexium 40 Mg  Cpdr (Esomeprazole Magnesium) .Marland Kitchen.. 1 By Mouth Once Daily 3)  Aspirin Ec 325 Mg Tbec (Aspirin) .... Take One Tablet By Mouth Daily 4)  Fish Oil 1000 Mg  Caps (Omega-3 Fatty Acids) .... 2 By Mouth Two Times A Day 5)  Celebrex 200 Mg Caps (Celecoxib) .... Take One Tablet By Mouth Once Daily. 6)  Metoprolol Succinate 50 Mg Xr24h-Tab (Metoprolol Succinate) .... Take One Tablet By Mouth Daily  Allergies (verified): No Known Drug Allergies  Past History:  Past Medical History: Last updated: 10/22/2008 ATRIAL FIBRILLATION (ICD-427.31) LYMPHOMA (ICD-202.80), FOLLICULAR , Dr Clelia Croft CERVICAL LYMPHADENOPATHY [phone]) PREVENTIVE HEALTH CARE (ICD-V70.0) FAMILY HISTORY DIABETES 1ST DEGREE RELATIVE  (ICD-V18.0) GERD (ICD-530.81) ERECTILE DYSFUNCTION (ICD-302.72) HYPERTENSION (ICD-401.9) KNEE PAIN, RIGHT, ACUTE (EAV-409.81)  Past Surgical History: Last updated: 08/03/2009 Vasectomy (1985) bx lymph node--  08/2008  Risk Factors: Alcohol Use: 0 (08/03/2009) Caffeine Use: 0 (08/03/2009) Exercise: no (08/03/2009)  Risk Factors: Smoking Status: current (08/03/2009)  Review of Systems  The patient denies chest pain, syncope, dyspnea on exertion, and peripheral edema.    Vital Signs:  Patient profile:   60 year old male Height:      70 inches Weight:      255.50 pounds BMI:     36.79 Pulse rate:   78 / minute BP sitting:   110 / 88  (left arm) Cuff size:   large  Vitals Entered By: Caralee Ates CMA (April 26, 2010 12:14 PM)  Physical Exam  General:  Well-developed,well-nourished,in no acute distress; alert,appropriate and cooperative throughout examination Head:  Normocephalic and atraumatic without obvious abnormalities. No apparent alopecia or balding. Eyes:  pupils equal, pupils round, pupils reactive to light, and no injection.   Mouth:  Oral mucosa and oropharynx without lesions or exudates.  Teeth in good repair. Neck:  b/l cervical lymphadenopathy Lungs:  Normal respiratory effort, chest expands symmetrically. Lungs are clear to auscultation, no crackles or wheezes. Heart:  irregular , irregular with no murmurs. Abdomen:  Bowel sounds positive,abdomen soft and non-tender without masses, organomegaly or hernias noted. Msk:  normal ROM, no joint tenderness, no joint swelling, no joint warmth, no redness over joints, no joint deformities, no joint instability, no crepitation, and  no muscle atrophy.   Pulses:  R posterior tibial normal, R dorsalis pedis normal, R carotid normal, L posterior tibial normal, L dorsalis pedis normal, and L carotid normal.   Extremities:  No clubbing, cyanosis, edema, or deformity noted with normal full range of motion of all joints.     Neurologic:  No cranial nerve deficits noted. Station and gait are normal. Plantar reflexes are down-going bilaterally. DTRs are symmetrical throughout. Sensory, motor and coordinative functions appear intact.   Impression & Recommendations:  Problem # 1:  ATRIAL FIBRILLATION (ICD-427.31) His rate is well controllled as he is tolerating his beta blockers. His updated medication list for this problem includes:    Aspirin Ec 325 Mg Tbec (Aspirin) .Marland Kitchen... Take one tablet by mouth daily    Metoprolol Succinate 50 Mg Xr24h-tab (Metoprolol succinate) .Marland Kitchen... Take one tablet by mouth daily  Orders: EKG w/ Interpretation (93000)  Problem # 2:  HYPERTENSION (ICD-401.9) His blood pressure is well controlled. Will follow. His updated medication list for this problem includes:    Diovan Hct 320-12.5 Mg Tabs (Valsartan-hydrochlorothiazide) .Marland Kitchen... 1 by mouth once daily    Aspirin Ec 325 Mg Tbec (Aspirin) .Marland Kitchen... Take one tablet by mouth daily    Metoprolol Succinate 50 Mg Xr24h-tab (Metoprolol succinate) .Marland Kitchen... Take one tablet by mouth daily  Patient Instructions: 1)  Your physician recommends that you continue on your current medications as directed. Please refer to the Current Medication list given to you today. 2)  Your physician wants you to follow-up in: 1 YEAR  You will receive a reminder letter in the mail two months in advance. If you don't receive a letter, please call our office to schedule the follow-up appointment.

## 2010-06-29 NOTE — Assessment & Plan Note (Signed)
Summary: rov/ gd   Visit Type:  Follow-up Primary Provider:  Laury Axon   History of Present Illness: Mr. Sturtevant returns today for followup of his atrial fibrillation.  He is a pleasant 60 yo man with a h/o atrial fibrillation and a low grade lymphoma.  He was seen by me several months ago with newly diagnosed atrial fibrillation as part of a pre-op screening.  He is minimally symptomatic from the perspective of palpitations.  He does c/o feeling week.  He denies c/p or sob.  He is being managed conservatively for now as his lymphoma is felt to be low grade.  He recently underwent holter monitoring which demonstrated periods of rapid ventricular response in atrial fibrillation.  He denies c/p.  He has minimal dyspnea with exertion.  Current Medications (verified): 1)  Diovan Hct 320-12.5 Mg  Tabs (Valsartan-Hydrochlorothiazide) .Marland Kitchen.. 1 By Mouth Once Daily 2)  Nexium 40 Mg  Cpdr (Esomeprazole Magnesium) .Marland Kitchen.. 1 By Mouth Once Daily 3)  Aspirin Ec 325 Mg Tbec (Aspirin) .... Take One Tablet By Mouth Daily 4)  Fish Oil 1000 Mg  Caps (Omega-3 Fatty Acids) .... 2 By Mouth Two Times A Day 5)  Celebrex .... Once Daily  Allergies (verified): No Known Drug Allergies  Past History:  Past Medical History: Last updated: 10/22/2008 ATRIAL FIBRILLATION (ICD-427.31) LYMPHOMA (ICD-202.80), FOLLICULAR , Dr Clelia Croft CERVICAL LYMPHADENOPATHY [phone]) PREVENTIVE HEALTH CARE (ICD-V70.0) FAMILY HISTORY DIABETES 1ST DEGREE RELATIVE (ICD-V18.0) GERD (ICD-530.81) ERECTILE DYSFUNCTION (ICD-302.72) HYPERTENSION (ICD-401.9) KNEE PAIN, RIGHT, ACUTE (ICD-719.46)  Review of Systems       The patient complains of dyspnea on exertion.  The patient denies chest pain, syncope, and peripheral edema.    Vital Signs:  Patient profile:   60 year old male Height:      70 inches Weight:      256 pounds BMI:     36.86 Pulse rate:   87 / minute BP sitting:   120 / 82  (left arm)  Vitals Entered By: Laurance Flatten CMA  (July 10, 2009 3:12 PM)  Physical Exam  General:  Well developed, well nourished, in no acute distress. Head:  normocephalic and atraumatic Eyes:  PERRLA/EOM intact; conjunctiva and lids normal. Mouth:  Teeth, gums and palate normal. Oral mucosa normal. Neck:  Neck supple, no JVD. No masses, thyromegaly or abnormal cervical nodes. Lungs:  Clear bilaterally to auscultation. No wheezes, rales, or rhonchi. Heart:  IRIR with normal S1 and S2.  PMI is not enlarge or laterally displaced. Abdomen:  Bowel sounds positive; abdomen soft and non-tender without masses, organomegaly, or hernias noted. No hepatosplenomegaly. Msk:  Back normal, normal gait. Muscle strength and tone normal. Pulses:  pulses normal in all 4 extremities Extremities:  No clubbing or cyanosis. Neurologic:  Alert and oriented x 3.   EKG  Procedure date:  07/10/2009  Findings:      Atrial fibrillation with a controlled ventricular response rate of:87.  Impression & Recommendations:  Problem # 1:  ATRIAL FIBRILLATION (ICD-427.31) His ventricular rate is not well controlled. I have recommended he start a low dose of toprol today.  Will follow. His updated medication list for this problem includes:    Aspirin Ec 325 Mg Tbec (Aspirin) .Marland Kitchen... Take one tablet by mouth daily    Toprol Xl 25 Mg Xr24h-tab (Metoprolol succinate) ..... One by mouth once daily  Orders: EKG w/ Interpretation (93000)  Problem # 2:  HYPERTENSION (ICD-401.9) His blood pressure apears to be well controlled.  A low sodium  diet is recommended. The following medications were removed from the medication list:    Amlodipine Besylate 5 Mg Tabs (Amlodipine besylate) .Marland Kitchen... Take one tablet by mouth daily His updated medication list for this problem includes:    Diovan Hct 320-12.5 Mg Tabs (Valsartan-hydrochlorothiazide) .Marland Kitchen... 1 by mouth once daily    Aspirin Ec 325 Mg Tbec (Aspirin) .Marland Kitchen... Take one tablet by mouth daily    Toprol Xl 25 Mg Xr24h-tab  (Metoprolol succinate) ..... One by mouth once daily  Patient Instructions: 1)  Your physician recommends that you schedule a follow-up appointment in: 3-4 months with Dr [name] 2)  Your physician has recommended you make the following change in your medication: start Toprol XL 25mg  daily Prescriptions: TOPROL XL 25 MG XR24H-TAB (METOPROLOL SUCCINATE) one by mouth once daily  #30 x 11   Entered by:   Dennis Bast, RN, BSN   Authorized by:   Laren Boom, MD, [facility]   Signed by:   Dennis Bast, RN, BSN on 07/10/2009   Method used:   Electronically to        [id number] (retail)       [address]       [place], [place]  [address]       Ph: [phone] or [phone]       Fax: [phone]   RxID:   [id number]

## 2010-06-29 NOTE — Assessment & Plan Note (Signed)
Summary: CPX,RENEW MEDS,BCBS INS/RH.....   Vital Signs:  Patient profile:   60 year old male Weight:      259 pounds Temp:     98.4 degrees F oral Pulse rate:   85 / minute Pulse rhythm:   regular BP sitting:   124 / 82  (left arm) Cuff size:   large  Vitals Entered By: Army Fossa CMA (August 03, 2009 9:04 AM) CC: CPX- no complaints, refills meds   History of Present Illness: Pt here for cpe and labs.  Pt seeing Dr [name] for afib and Dr Clelia Croft for lymphoma.  Pt remains asymptomatic.   No complaints.    Preventive Screening-Counseling & Management  Alcohol-Tobacco     Alcohol drinks/day: 0     Smoking Status: current  Caffeine-Diet-Exercise     Caffeine use/day: 0     Does Patient Exercise: no  Hep-HIV-STD-Contraception     HIV Risk: no     Dental Visit-last 6 months yes     Dental Care Counseling: not indicated; dental care within six months  Safety-Violence-Falls     Seat Belt Use: 100      Sexual History:  currently monogamous.        Drug Use:  never.    Current Medications (verified): 1)  Diovan Hct 320-12.5 Mg  Tabs (Valsartan-Hydrochlorothiazide) .Marland Kitchen.. 1 By Mouth Once Daily 2)  Nexium 40 Mg  Cpdr (Esomeprazole Magnesium) .Marland Kitchen.. 1 By Mouth Once Daily 3)  Aspirin Ec 325 Mg Tbec (Aspirin) .... Take One Tablet By Mouth Daily 4)  Fish Oil 1000 Mg  Caps (Omega-3 Fatty Acids) .... 2 By Mouth Two Times A Day 5)  Celebrex .... Once Daily 6)  Toprol Xl 25 Mg Xr24h-Tab (Metoprolol Succinate) .... One By Mouth Once Daily  Allergies (verified): No Known Drug Allergies  Past History:  Past Surgical History: Vasectomy (1985) bx lymph node--  08/2008  Family History: Reviewed history from 07/25/2007 and no changes required. Family History Diabetes 1st degree relative Family History Hypertension Family History Kidney disease  Social History: Reviewed history from 07/25/2007 and no changes required. Occupation: [employer]  Married Never Smoked Current  Smoker Alcohol use-no Drug use-no Regular exercise-no Dental Care w/in 6 mos.:  yes Sexual History:  currently monogamous Drug Use:  never  Review of Systems      See HPI General:  Denies chills, fatigue, fever, loss of appetite, malaise, sleep disorder, sweats, weakness, and weight loss. Eyes:  Denies blurring, discharge, double vision, eye irritation, eye pain, halos, itching, light sensitivity, red eye, vision loss-1 eye, and vision loss-both eyes. ENT:  Denies decreased hearing, difficulty swallowing, ear discharge, earache, hoarseness, nasal congestion, nosebleeds, postnasal drainage, ringing in ears, sinus pressure, and sore throat. CV:  Denies bluish discoloration of lips or nails, chest pain or discomfort, difficulty breathing at night, difficulty breathing while lying down, fainting, fatigue, leg cramps with exertion, lightheadness, near fainting, palpitations, shortness of breath with exertion, swelling of feet, swelling of hands, and weight gain. Resp:  Denies chest discomfort, chest pain with inspiration, cough, coughing up blood, excessive snoring, hypersomnolence, morning headaches, pleuritic, shortness of breath, sputum productive, and wheezing. GI:  Denies abdominal pain, bloody stools, change in bowel habits, constipation, dark tarry stools, diarrhea, excessive appetite, gas, hemorrhoids, indigestion, loss of appetite, nausea, vomiting, vomiting blood, and yellowish skin color. GU:  Denies decreased libido, discharge, dysuria, erectile dysfunction, genital sores, hematuria, incontinence, nocturia, urinary frequency, and urinary hesitancy. MS:  Denies joint pain, joint redness,  joint swelling, loss of strength, low back pain, mid back pain, muscle aches, muscle , cramps, muscle weakness, stiffness, and thoracic pain. Derm:  Denies changes in color of skin, changes in nail beds, dryness, excessive perspiration, flushing, hair loss, insect bite(s), itching, lesion(s), poor wound  healing, and rash. Neuro:  Denies brief paralysis, difficulty with concentration, disturbances in coordination, falling down, headaches, inability to speak, memory loss, numbness, poor balance, seizures, sensation of room spinning, tingling, tremors, visual disturbances, and weakness. Psych:  Denies alternate hallucination ( auditory/visual), anxiety, depression, easily angered, easily tearful, irritability, mental problems, panic attacks, sense of great danger, suicidal thoughts/plans, thoughts of violence, unusual visions or sounds, and thoughts /plans of harming others. Endo:  Denies cold intolerance, excessive hunger, excessive thirst, excessive urination, heat intolerance, polyuria, and weight change. Heme:  Denies abnormal bruising, bleeding, enlarge lymph nodes, fevers, pallor, and skin discoloration. Allergy:  Denies hives or rash, itching eyes, persistent infections, seasonal allergies, and sneezing.  Physical Exam  General:  Well-developed,well-nourished,in no acute distress; alert,appropriate and cooperative throughout examination Head:  Normocephalic and atraumatic without obvious abnormalities. No apparent alopecia or balding. Eyes:  pupils equal, pupils round, pupils reactive to light, and no injection.   Ears:  External ear exam shows no significant lesions or deformities.  Otoscopic examination reveals clear canals, tympanic membranes are intact bilaterally without bulging, retraction, inflammation or discharge. Hearing is grossly normal bilaterally. Nose:  External nasal examination shows no deformity or inflammation. Nasal mucosa are pink and moist without lesions or exudates. Mouth:  Oral mucosa and oropharynx without lesions or exudates.  Teeth in good repair. Neck:  b/l cervical lymphadenopathy Breasts:  No masses or gynecomastia noted Lungs:  Normal respiratory effort, chest expands symmetrically. Lungs are clear to auscultation, no crackles or wheezes. Heart:  irregular ,  irregular Abdomen:  Bowel sounds positive,abdomen soft and non-tender without masses, organomegaly or hernias noted. Rectal:  No external abnormalities noted. Normal sphincter tone. No rectal masses or tenderness. Genitalia:  Testes bilaterally descended without nodularity, tenderness or masses. No scrotal masses or lesions. No penis lesions or urethral discharge. Prostate:  Prostate gland firm and smooth, no enlargement, nodularity, tenderness, mass, asymmetry or induration. Msk:  normal ROM, no joint tenderness, no joint swelling, no joint warmth, no redness over joints, no joint deformities, no joint instability, no crepitation, and no muscle atrophy.   Pulses:  R posterior tibial normal, R dorsalis pedis normal, R carotid normal, L posterior tibial normal, L dorsalis pedis normal, and L carotid normal.   Extremities:  No clubbing, cyanosis, edema, or deformity noted with normal full range of motion of all joints.   Neurologic:  No cranial nerve deficits noted. Station and gait are normal. Plantar reflexes are down-going bilaterally. DTRs are symmetrical throughout. Sensory, motor and coordinative functions appear intact. Skin:  Intact without suspicious lesions or rashes Cervical Nodes:  No lymphadenopathy noted Psych:  Cognition and judgment appear intact. Alert and cooperative with normal attention span and concentration. No apparent delusions, illusions, hallucinations   Impression & Recommendations:  Problem # 1:  PREVENTIVE HEALTH CARE (ICD-V70.0) ghm utd  Orders: Venipuncture ([address]) TLB-Lipid Panel (80061-LIPID) TLB-BMP (Basic Metabolic Panel-BMET) (80048-METABOL) TLB-CBC Platelet - w/Differential (85025-CBCD) TLB-Hepatic/Liver Function Pnl (80076-HEPATIC) TLB-TSH (Thyroid Stimulating Hormone) (84443-TSH) TLB-PSA (Prostate Specific Antigen) (84153-PSA) EKG w/ Interpretation (93000)  Reviewed preventive care protocols, scheduled due services, and updated  immunizations.  Problem # 2:  ATRIAL FIBRILLATION (ICD-427.31)  His updated medication list for this problem includes:    Aspirin  Ec 325 Mg Tbec (Aspirin) .Marland Kitchen... Take one tablet by mouth daily    Toprol Xl 25 Mg Xr24h-tab (Metoprolol succinate) ..... One by mouth once daily  Orders: Venipuncture (34742) TLB-Lipid Panel (80061-LIPID) TLB-BMP (Basic Metabolic Panel-BMET) (80048-METABOL) TLB-CBC Platelet - w/Differential (85025-CBCD) TLB-Hepatic/Liver Function Pnl (80076-HEPATIC) TLB-TSH (Thyroid Stimulating Hormone) (84443-TSH) TLB-PSA (Prostate Specific Antigen) (84153-PSA) EKG w/ Interpretation (93000)  Problem # 3:  LYMPHOMA (ICD-202.80)  Orders: Venipuncture (59563) TLB-Lipid Panel (80061-LIPID) TLB-BMP (Basic Metabolic Panel-BMET) (80048-METABOL) TLB-CBC Platelet - w/Differential (85025-CBCD) TLB-Hepatic/Liver Function Pnl (80076-HEPATIC) TLB-TSH (Thyroid Stimulating Hormone) (84443-TSH) TLB-PSA (Prostate Specific Antigen) (84153-PSA) EKG w/ Interpretation (93000)  Problem # 4:  GERD (ICD-530.81)  His updated medication list for this problem includes:    Nexium 40 Mg Cpdr (Esomeprazole magnesium) .Marland Kitchen... 1 by mouth once daily  Orders: Venipuncture (87564) TLB-Lipid Panel (80061-LIPID) TLB-BMP (Basic Metabolic Panel-BMET) (80048-METABOL) TLB-CBC Platelet - w/Differential (85025-CBCD) TLB-Hepatic/Liver Function Pnl (80076-HEPATIC) TLB-TSH (Thyroid Stimulating Hormone) (84443-TSH) TLB-PSA (Prostate Specific Antigen) (84153-PSA)  Diagnostics Reviewed:  Discussed lifestyle modifications, diet, antacids/medications, and preventive measures. Handout provided.   Problem # 5:  HYPERTENSION (ICD-401.9)  His updated medication list for this problem includes:    Diovan Hct 320-12.5 Mg Tabs (Valsartan-hydrochlorothiazide) .Marland Kitchen... 1 by mouth once daily    Toprol Xl 25 Mg Xr24h-tab (Metoprolol succinate) ..... One by mouth once daily  Orders: Venipuncture (33295) TLB-Lipid Panel  (80061-LIPID) TLB-BMP (Basic Metabolic Panel-BMET) (80048-METABOL) TLB-CBC Platelet - w/Differential (85025-CBCD) TLB-Hepatic/Liver Function Pnl (80076-HEPATIC) TLB-TSH (Thyroid Stimulating Hormone) (84443-TSH) TLB-PSA (Prostate Specific Antigen) (84153-PSA) EKG w/ Interpretation (93000)  BP today: 124/82 Prior BP: 120/82 (07/10/2009)  Labs Reviewed: K+: 3.7 (07/25/2007) Creat: : 0.8 (07/21/2008)   Chol: 129 (07/25/2007)   HDL: 32.1 (07/25/2007)   LDL: 86 (07/25/2007)   TG: 55 (07/25/2007)  Complete Medication List: 1)  Diovan Hct 320-12.5 Mg Tabs (Valsartan-hydrochlorothiazide) .Marland Kitchen.. 1 by mouth once daily 2)  Nexium 40 Mg Cpdr (Esomeprazole magnesium) .Marland Kitchen.. 1 by mouth once daily 3)  Aspirin Ec 325 Mg Tbec (Aspirin) .... Take one tablet by mouth daily 4)  Fish Oil 1000 Mg Caps (Omega-3 fatty acids) .... 2 by mouth two times a day 5)  Celebrex  .... Once daily 6)  Toprol Xl 25 Mg Xr24h-tab (Metoprolol succinate) .... One by mouth once daily Prescriptions: NEXIUM 40 MG  CPDR (ESOMEPRAZOLE MAGNESIUM) 1 by mouth once daily  #30 Capsule x 11   Entered and Authorized by:   Loreen Freud DO   Signed by:   Loreen Freud DO on 08/03/2009   Method used:   Electronically to        [id number] (retail)       [address]       [place], [place]  [address]       Ph: [phone] or [phone]       Fax: [phone]   RxID:   [id number]    EKG  Procedure date:  08/03/2009  Findings:      Atrial fibrillation with a controlled ventricular response rate of: 88       Flu Vaccine Next Due:  Refused   Appended Document: CPX,RENEW MEDS,BCBS INS/RH.....  Laboratory Results   Urine Tests   Date/Time Reported: August 03, 2009 1:08 PM   Routine Urinalysis   Color: yellow Appearance: Clear Glucose: negative   (Normal Range: Negative) Bilirubin: negative   (Normal Range: Negative) Ketone: negative   (Normal Range: Negative) Spec.  Gravity: <  1.005   (Normal Range: 1.003-1.035) Blood: small   (Normal Range: Negative) pH: 7.0   (Normal Range: 5.0-8.0) Protein: negative   (Normal Range: Negative) Urobilinogen: negative   (Normal Range: 0-1) Nitrite: negative   (Normal Range: Negative) Leukocyte Esterace: negative   (Normal Range: Negative)    Comments: Floydene Flock  August 03, 2009 1:09 PM cx sent

## 2010-06-29 NOTE — Letter (Signed)
Summary: [facility], Main Office  [address]   [place], [place] [address]   Phone: [phone]  Fax: [phone]     April 26, 2010    Tom Fuller   The above named patient had a medical visit today at: 11:45  am.  Please take this into consideration when reviewing the time away from work/school.      Sincerely yours,  Architectural technologist

## 2010-06-29 NOTE — Letter (Signed)
Summary: Primary Care Appointment Letter  [facility] [place]  [address] [place], [place] [address]   Phone: [phone]  Fax: [phone]    07/17/2009 MRN: [id number]  [facility] [address] [place], [place]  [address]  Dear Mr. Hucker,   Your Primary Care Physician Loreen Freud DO has indicated that:    ____X___it is time to schedule an appointment before further refills will be given.    _______you missed your appointment on______ and need to call and          reschedule.    _______you need to have lab work done.    _______you need to schedule an appointment discuss lab or test results.    _______you need to call to reschedule your appointment that is                       scheduled on _________.     Please call our office as soon as possible. Our phone number is [phone]___. Our office is open 8a-5p, Monday through Friday.     Thank you,    [facility] Primary Care Scheduler

## 2010-06-29 NOTE — Letter (Signed)
Summary: [facility]: Immunoassay Fecal Occult Blood (iFOB) Order Form  [facility] [place]  [address] [place], [place] [address]   Phone: [phone]  Fax: [phone]       Lab: Immunoassay Fecal Occult Blood (iFOB) Order Form   August 03, 2009 MRN: [id number]   JAHREE Herbster 1950/11/23   Physicican Name:_____Yvonne Lowne,DO____________________  Diagnosis Code:______V76.51____________________      Army Fossa CMA

## 2010-06-29 NOTE — Letter (Signed)
[facility]   Imported By: Lanelle Bal 09/25/2009 11:32:59  _____________________________________________________________________  External Attachment:    Type:   Image     Comment:   External Document

## 2010-06-29 NOTE — Letter (Signed)
Summary: [facility]   [facility]   Imported By: Roderic Ovens 06/02/2009 13:44:16  _____________________________________________________________________  External Attachment:    Type:   Image     Comment:   External Document

## 2010-07-16 ENCOUNTER — Encounter (HOSPITAL_BASED_OUTPATIENT_CLINIC_OR_DEPARTMENT_OTHER): Payer: BC Managed Care – PPO | Admitting: Oncology

## 2010-07-16 ENCOUNTER — Other Ambulatory Visit: Payer: Self-pay | Admitting: Oncology

## 2010-07-16 ENCOUNTER — Encounter: Payer: Self-pay | Admitting: Family Medicine

## 2010-07-16 DIAGNOSIS — I10 Essential (primary) hypertension: Secondary | ICD-10-CM

## 2010-07-16 DIAGNOSIS — C8590 Non-Hodgkin lymphoma, unspecified, unspecified site: Secondary | ICD-10-CM

## 2010-07-16 DIAGNOSIS — C8299 Follicular lymphoma, unspecified, extranodal and solid organ sites: Secondary | ICD-10-CM

## 2010-07-16 DIAGNOSIS — C8588 Other specified types of non-Hodgkin lymphoma, lymph nodes of multiple sites: Secondary | ICD-10-CM

## 2010-07-16 LAB — COMPREHENSIVE METABOLIC PANEL
Albumin: 4 g/dL (ref 3.5–5.2)
Alkaline Phosphatase: 63 U/L (ref 39–117)
BUN: 16 mg/dL (ref 6–23)
CO2: 29 mEq/L (ref 19–32)
Calcium: 9.4 mg/dL (ref 8.4–10.5)
Chloride: 103 mEq/L (ref 96–112)
Glucose, Bld: 100 mg/dL — ABNORMAL HIGH (ref 70–99)
Potassium: 3.9 mEq/L (ref 3.5–5.3)
Sodium: 142 mEq/L (ref 135–145)
Total Protein: 6.9 g/dL (ref 6.0–8.3)

## 2010-07-16 LAB — CBC WITH DIFFERENTIAL/PLATELET
Basophils Absolute: 0 10*3/uL (ref 0.0–0.1)
Eosinophils Absolute: 0.2 10*3/uL (ref 0.0–0.5)
HGB: 14.8 g/dL (ref 13.0–17.1)
MONO#: 1 10*3/uL — ABNORMAL HIGH (ref 0.1–0.9)
NEUT#: 5.8 10*3/uL (ref 1.5–6.5)
RBC: 4.73 10*6/uL (ref 4.20–5.82)
RDW: 13.3 % (ref 11.0–14.6)
WBC: 8.5 10*3/uL (ref 4.0–10.3)
lymph#: 1.5 10*3/uL (ref 0.9–3.3)

## 2010-07-16 LAB — LACTATE DEHYDROGENASE: LDH: 132 U/L (ref 94–250)

## 2010-08-10 LAB — POCT I-STAT, CHEM 8
Calcium, Ion: 1.2 mmol/L (ref 1.12–1.32)
Creatinine, Ser: 1.1 mg/dL (ref 0.4–1.5)
Glucose, Bld: 93 mg/dL (ref 70–99)
HCT: 44 % (ref 39.0–52.0)
Hemoglobin: 15 g/dL (ref 13.0–17.0)
Potassium: 4 mEq/L (ref 3.5–5.1)

## 2010-08-12 ENCOUNTER — Encounter (INDEPENDENT_AMBULATORY_CARE_PROVIDER_SITE_OTHER): Payer: Self-pay | Admitting: *Deleted

## 2010-08-17 NOTE — Letter (Signed)
Summary: Primary Care Appointment Letter  [facility] [place]  [address] [place], [place] [address]   Phone: [phone]  Fax: [phone]    08/12/2010 MRN: [id number]     [facility] [address] [place], [place]  [address]     Dear Mr. Esquivias,   Your Primary Care Physician Loreen Freud DO has indicated that:    ___x___it is time to schedule an appointment for a physical and fasting labs.    _______you missed your appointment on______ and need to call and          reschedule.    _______you need to have lab work done.    _______you need to schedule an appointment discuss lab or test results.    _______you need to call to reschedule your appointment that is                       scheduled on _________.     Please call our office as soon as possible. Our phone number is [phone]. Please press option 1. Our office is open 8a-5p, Monday through Friday.     Thank you, H. Rivera Colon Primary Care Scheduler

## 2010-08-17 NOTE — Letter (Signed)
Summary: [facility]  [facility]   Imported By: Maryln Gottron 08/11/2010 15:27:07  _____________________________________________________________________  External Attachment:    Type:   Image     Comment:   External Document

## 2010-09-09 LAB — CBC
MCHC: 34.2 g/dL (ref 30.0–36.0)
MCV: 90.2 fL (ref 78.0–100.0)
RBC: 4.93 MIL/uL (ref 4.22–5.81)
RDW: 13.5 % (ref 11.5–15.5)

## 2010-09-09 LAB — PROTIME-INR
INR: 1 (ref 0.00–1.49)
Prothrombin Time: 13.8 seconds (ref 11.6–15.2)

## 2010-09-15 ENCOUNTER — Other Ambulatory Visit: Payer: Self-pay | Admitting: Family Medicine

## 2010-09-22 ENCOUNTER — Encounter: Payer: Self-pay | Admitting: Family Medicine

## 2010-09-23 ENCOUNTER — Encounter: Payer: Self-pay | Admitting: Family Medicine

## 2010-09-23 ENCOUNTER — Ambulatory Visit (INDEPENDENT_AMBULATORY_CARE_PROVIDER_SITE_OTHER): Payer: BC Managed Care – PPO | Admitting: Family Medicine

## 2010-09-23 VITALS — BP 126/82 | HR 76 | Ht 70.0 in | Wt 257.0 lb

## 2010-09-23 DIAGNOSIS — I10 Essential (primary) hypertension: Secondary | ICD-10-CM

## 2010-09-23 DIAGNOSIS — I4891 Unspecified atrial fibrillation: Secondary | ICD-10-CM

## 2010-09-23 DIAGNOSIS — C8590 Non-Hodgkin lymphoma, unspecified, unspecified site: Secondary | ICD-10-CM

## 2010-09-23 DIAGNOSIS — R319 Hematuria, unspecified: Secondary | ICD-10-CM

## 2010-09-23 DIAGNOSIS — C8589 Other specified types of non-Hodgkin lymphoma, extranodal and solid organ sites: Secondary | ICD-10-CM

## 2010-09-23 DIAGNOSIS — Z0000 Encounter for general adult medical examination without abnormal findings: Secondary | ICD-10-CM | POA: Insufficient documentation

## 2010-09-23 DIAGNOSIS — K219 Gastro-esophageal reflux disease without esophagitis: Secondary | ICD-10-CM

## 2010-09-23 DIAGNOSIS — F528 Other sexual dysfunction not due to a substance or known physiological condition: Secondary | ICD-10-CM

## 2010-09-23 LAB — CBC WITH DIFFERENTIAL/PLATELET
Basophils Relative: 0.8 % (ref 0.0–3.0)
Eosinophils Absolute: 0.2 10*3/uL (ref 0.0–0.7)
Eosinophils Relative: 2.6 % (ref 0.0–5.0)
HCT: 45 % (ref 39.0–52.0)
Lymphs Abs: 1.5 10*3/uL (ref 0.7–4.0)
MCHC: 34.4 g/dL (ref 30.0–36.0)
MCV: 92.5 fl (ref 78.0–100.0)
Monocytes Absolute: 0.9 10*3/uL (ref 0.1–1.0)
Platelets: 255 10*3/uL (ref 150.0–400.0)
RBC: 4.86 Mil/uL (ref 4.22–5.81)
WBC: 8.1 10*3/uL (ref 4.5–10.5)

## 2010-09-23 LAB — POCT URINALYSIS DIPSTICK
Bilirubin, UA: NEGATIVE
Leukocytes, UA: NEGATIVE
Nitrite, UA: NEGATIVE
pH, UA: 6

## 2010-09-23 LAB — BASIC METABOLIC PANEL
BUN: 21 mg/dL (ref 6–23)
GFR: 84.38 mL/min (ref >60.00)
Potassium: 4.4 mEq/L (ref 3.5–5.1)

## 2010-09-23 LAB — LIPID PANEL
HDL: 33 mg/dL — ABNORMAL LOW (ref >39.00)
Triglycerides: 46 mg/dL (ref 0.0–149.0)

## 2010-09-23 LAB — HEPATIC FUNCTION PANEL
Albumin: 4.1 g/dL (ref 3.5–5.2)
Total Protein: 7.1 g/dL (ref 6.0–8.3)

## 2010-09-23 MED ORDER — VALSARTAN-HYDROCHLOROTHIAZIDE 320-12.5 MG PO TABS: ORAL_TABLET | ORAL | Status: DC

## 2010-09-23 MED ORDER — ESOMEPRAZOLE MAGNESIUM 40 MG PO CPDR: 40.0000 mg | DELAYED_RELEASE_CAPSULE | Freq: Every day | ORAL | Status: DC

## 2010-09-23 NOTE — Assessment & Plan Note (Signed)
meds prn  

## 2010-09-23 NOTE — Assessment & Plan Note (Signed)
>>  ASSESSMENT AND PLAN FOR ESSENTIAL HYPERTENSION WRITTEN ON 09/23/2010  9:28 AM BY LOWNE, YVONNE R, DO  Cont meds Stable Check labs

## 2010-09-23 NOTE — Progress Notes (Signed)
  Subjective:    Patient ID: Tom Fuller, male    DOB: [birth year], 60 y.o.   MRN: [id number]  HPI Pt here for cpe.  No complaints of sob, cp, palpatations.  Pt c/o some fatigue but he is under a lot of stress now.  No other complaints.    Review of Systems  Review of Systems  Constitutional: Negative for activity change, appetite change and fatigue.  HENT: Negative for hearing loss, congestion, tinnitus and ear discharge.   Eyes: Negative for visual disturbance (see optho q1y -- vision corrected to 20/20 with glasses).  Respiratory: Negative for cough, chest tightness and shortness of breath.   Cardiovascular: Negative for chest pain, palpitations and leg swelling.  Gastrointestinal: Negative for abdominal pain, diarrhea, constipation and abdominal distention.  Genitourinary: Negative for urgency, frequency, decreased urine volume and difficulty urinating.  Musculoskeletal: Negative for back pain, arthralgias and gait problem.  Skin: Negative for color change, pallor and rash.  Neurological: Negative for dizziness, light-headedness, numbness and headaches.  Hematological: Negative for adenopathy. Does not bruise/bleed easily.  Psychiatric/Behavioral: Negative for suicidal ideas, confusion, sleep disturbance, self-injury, dysphoric mood, decreased concentration and agitation.  Dentist--q60m Dr [name]--- A fib Dr Drucilla Chalet Objective:   Physical Exam BP 126/82  Pulse 76  Ht 5\' 10"  (1.778 m)  Wt 257 lb (116.574 kg)  BMI 36.88 kg/m2  General Appearance:    Alert, cooperative, no distress, appears stated age  Head:    Normocephalic, without obvious abnormality, atraumatic  Eyes:    PERRL, conjunctiva/corneas clear, EOM's intact, fundi    benign, both eyes       Ears:    Normal TM's and external ear canals, both ears  Nose:   Nares normal, septum midline, mucosa normal, no drainage   or sinus tenderness  Throat:   Lips, mucosa, and tongue normal; teeth and gums normal  Neck:    Supple, symmetrical, trachea midline, no adenopathy;       thyroid:  No enlargement/tenderness/nodules; no carotid   bruit or JVD  Back:     Symmetric, no curvature, ROM normal, no CVA tenderness  Lungs:     Clear to auscultation bilaterally, respirations unlabored  Chest wall:    No tenderness or deformity  Heart:    Irreg, irreg  Abdomen:     Soft, non-tender, bowel sounds active all four quadrants,    no masses, no organomegaly  Genitalia:    Normal male without lesion, discharge or tenderness  Rectal:    Normal tone, normal prostate, no masses or tenderness;   guaiac negative stool  Extremities:   Extremities normal, atraumatic, no cyanosis or edema  Pulses:   2+ and symmetric all extremities  Skin:   Skin color, texture, turgor normal, no rashes or lesions  Lymph nodes:    supraclavicular, and axillary nodes normal--+enlarged cervical lymph nodes  Neurologic:   CNII-XII intact. Normal strength, sensation and reflexes      throughout          Assessment & Plan:

## 2010-09-23 NOTE — Assessment & Plan Note (Signed)
Cont nexium

## 2010-09-23 NOTE — Progress Notes (Signed)
Addended by: Floydene Flock on: 09/23/2010 10:11 AM   Modules accepted: Orders

## 2010-09-23 NOTE — Assessment & Plan Note (Signed)
Per cardio Rate controlled

## 2010-09-23 NOTE — Assessment & Plan Note (Signed)
Cont meds Stable Check labs

## 2010-09-23 NOTE — Assessment & Plan Note (Signed)
ghm utd Check fasting labs 

## 2010-09-30 ENCOUNTER — Other Ambulatory Visit: Payer: Self-pay | Admitting: *Deleted

## 2010-09-30 MED ORDER — METOPROLOL SUCCINATE ER 50 MG PO TB24: 50.0000 mg | ORAL_TABLET | Freq: Every day | ORAL | Status: DC

## 2010-10-19 ENCOUNTER — Other Ambulatory Visit: Payer: Self-pay | Admitting: Oncology

## 2010-10-19 ENCOUNTER — Ambulatory Visit (HOSPITAL_COMMUNITY)
Admission: RE | Admit: 2010-10-19 | Discharge: 2010-10-19 | Disposition: A | Discharge status: Home / Self Care | Payer: BC Managed Care – PPO | Source: Ambulatory Visit | Attending: Oncology | Admitting: Oncology

## 2010-10-19 ENCOUNTER — Encounter: Payer: BC Managed Care – PPO | Admitting: Oncology

## 2010-10-19 DIAGNOSIS — M47812 Spondylosis without myelopathy or radiculopathy, cervical region: Secondary | ICD-10-CM | POA: Insufficient documentation

## 2010-10-19 DIAGNOSIS — C8589 Other specified types of non-Hodgkin lymphoma, extranodal and solid organ sites: Secondary | ICD-10-CM | POA: Insufficient documentation

## 2010-10-19 DIAGNOSIS — E041 Nontoxic single thyroid nodule: Secondary | ICD-10-CM | POA: Insufficient documentation

## 2010-10-19 DIAGNOSIS — J984 Other disorders of lung: Secondary | ICD-10-CM | POA: Insufficient documentation

## 2010-10-19 DIAGNOSIS — J329 Chronic sinusitis, unspecified: Secondary | ICD-10-CM | POA: Insufficient documentation

## 2010-10-19 DIAGNOSIS — R599 Enlarged lymph nodes, unspecified: Secondary | ICD-10-CM | POA: Insufficient documentation

## 2010-10-19 DIAGNOSIS — Q619 Cystic kidney disease, unspecified: Secondary | ICD-10-CM | POA: Insufficient documentation

## 2010-10-19 DIAGNOSIS — C8590 Non-Hodgkin lymphoma, unspecified, unspecified site: Secondary | ICD-10-CM

## 2010-10-19 LAB — CBC WITH DIFFERENTIAL/PLATELET
BASO%: 0.5 % (ref 0.0–2.0)
HCT: 42.3 % (ref 38.4–49.9)
HGB: 14.7 g/dL (ref 13.0–17.1)
MCHC: 34.7 g/dL (ref 32.0–36.0)
MONO#: 1.2 10*3/uL — ABNORMAL HIGH (ref 0.1–0.9)
NEUT%: 63.9 % (ref 39.0–75.0)
WBC: 8.4 10*3/uL (ref 4.0–10.3)
lymph#: 1.6 10*3/uL (ref 0.9–3.3)

## 2010-10-19 LAB — CMP (CANCER CENTER ONLY)
BUN, Bld: 17 mg/dL (ref 7–22)
CO2: 31 mEq/L (ref 18–33)
Calcium: 9.2 mg/dL (ref 8.0–10.3)
Chloride: 98 mEq/L (ref 98–108)
Creat: 1 mg/dl (ref 0.6–1.2)
Glucose, Bld: 99 mg/dL (ref 73–118)

## 2010-10-19 LAB — LACTATE DEHYDROGENASE: LDH: 166 U/L (ref 94–250)

## 2010-10-19 MED ORDER — IOHEXOL 300 MG/ML  SOLN
100.0000 mL | Freq: Once | INTRAMUSCULAR | Status: AC | PRN
  Administered 2010-10-19: 100 mL via INTRAVENOUS

## 2010-10-21 ENCOUNTER — Encounter (HOSPITAL_BASED_OUTPATIENT_CLINIC_OR_DEPARTMENT_OTHER): Payer: BC Managed Care – PPO | Admitting: Oncology

## 2010-10-21 DIAGNOSIS — C8588 Other specified types of non-Hodgkin lymphoma, lymph nodes of multiple sites: Secondary | ICD-10-CM

## 2011-01-28 ENCOUNTER — Other Ambulatory Visit: Payer: Self-pay | Admitting: Oncology

## 2011-01-28 ENCOUNTER — Encounter (HOSPITAL_BASED_OUTPATIENT_CLINIC_OR_DEPARTMENT_OTHER): Payer: BC Managed Care – PPO | Admitting: Oncology

## 2011-01-28 DIAGNOSIS — C8299 Follicular lymphoma, unspecified, extranodal and solid organ sites: Secondary | ICD-10-CM

## 2011-01-28 DIAGNOSIS — I10 Essential (primary) hypertension: Secondary | ICD-10-CM

## 2011-01-28 DIAGNOSIS — C8588 Other specified types of non-Hodgkin lymphoma, lymph nodes of multiple sites: Secondary | ICD-10-CM

## 2011-01-28 DIAGNOSIS — C8590 Non-Hodgkin lymphoma, unspecified, unspecified site: Secondary | ICD-10-CM

## 2011-01-28 LAB — COMPREHENSIVE METABOLIC PANEL
AST: 19 U/L (ref 0–37)
Albumin: 3.6 g/dL (ref 3.5–5.2)
BUN: 22 mg/dL (ref 6–23)
Calcium: 9.1 mg/dL (ref 8.4–10.5)
Chloride: 105 mEq/L (ref 96–112)
Creatinine, Ser: 0.99 mg/dL (ref 0.50–1.35)
Glucose, Bld: 122 mg/dL — ABNORMAL HIGH (ref 70–99)
Potassium: 3.8 mEq/L (ref 3.5–5.3)

## 2011-01-28 LAB — CBC WITH DIFFERENTIAL/PLATELET
Basophils Absolute: 0.1 10*3/uL (ref 0.0–0.1)
Eosinophils Absolute: 0.6 10*3/uL — ABNORMAL HIGH (ref 0.0–0.5)
HCT: 39.4 % (ref 38.4–49.9)
HGB: 14.2 g/dL (ref 13.0–17.1)
MCV: 85.8 fL (ref 79.3–98.0)
MONO%: 10.7 % (ref 0.0–14.0)
NEUT#: 5.5 10*3/uL (ref 1.5–6.5)
Platelets: 245 10*3/uL (ref 140–400)
RDW: 12.9 % (ref 11.0–14.6)

## 2011-03-04 ENCOUNTER — Encounter: Payer: Self-pay | Admitting: Gastroenterology

## 2011-03-14 ENCOUNTER — Encounter: Payer: Self-pay | Admitting: *Deleted

## 2011-03-14 ENCOUNTER — Encounter: Payer: Self-pay | Admitting: Family Medicine

## 2011-03-14 ENCOUNTER — Ambulatory Visit (INDEPENDENT_AMBULATORY_CARE_PROVIDER_SITE_OTHER): Payer: BC Managed Care – PPO | Admitting: Family Medicine

## 2011-03-14 VITALS — BP 130/88 | HR 80 | Temp 99.0°F | Resp 16 | Wt 261.0 lb

## 2011-03-14 DIAGNOSIS — H6690 Otitis media, unspecified, unspecified ear: Secondary | ICD-10-CM

## 2011-03-14 MED ORDER — CEFUROXIME AXETIL 500 MG PO TABS: 500.0000 mg | ORAL_TABLET | Freq: Two times a day (BID) | ORAL | Status: AC

## 2011-03-14 NOTE — Progress Notes (Signed)
  Subjective:     Tom Fuller is a 60 y.o. male who presents for evaluation of symptoms of a URI. Symptoms include bilateral ear pressure/pain, achiness, congestion, low grade fever, nasal congestion and productive cough with  yellow colored sputum. Onset of symptoms was 6 days ago, and has been gradually worsening since that time. Treatment to date: tylenol.  The following portions of the patient's history were reviewed and updated as appropriate: allergies, current medications, past family history, past medical history, past social history, past surgical history and problem list.  Review of Systems Pertinent items are noted in HPI.   Objective:    BP 130/88  Pulse 80  Temp(Src) 99 F (37.2 C) (Oral)  Resp 16  Wt 261 lb (118.389 kg) General appearance: alert, cooperative, appears stated age and no distress Ears: abnormal TM right ear - erythematous and retracted and abnormal TM left ear - erythematous and retracted Nose: Nares normal. Septum midline. Mucosa normal. No drainage or sinus tenderness. Throat: lips, mucosa, and tongue normal; teeth and gums normal Neck: mild anterior cervical adenopathy, thyroid not enlarged, symmetric, no tenderness/mass/nodules and + enlargement R side---prob from lymphoma Lungs: clear to auscultation bilaterally Heart: regular rate and rhythm, S1, S2 normal, no murmur, click, rub or gallop Extremities: extremities normal, atraumatic, no cyanosis or edema   Assessment:    sinusitis and b/l OM   Plan:    Discussed the diagnosis and treatment of sinusitis. Suggested symptomatic OTC remedies. Nasal saline spray for congestion. Ceftin per orders. Follow up as needed.

## 2011-03-28 ENCOUNTER — Encounter: Payer: Self-pay | Admitting: Family Medicine

## 2011-03-28 ENCOUNTER — Ambulatory Visit (INDEPENDENT_AMBULATORY_CARE_PROVIDER_SITE_OTHER): Payer: BC Managed Care – PPO | Admitting: Family Medicine

## 2011-03-28 VITALS — BP 126/84 | HR 83 | Temp 98.7°F | Wt 262.0 lb

## 2011-03-28 DIAGNOSIS — J302 Other seasonal allergic rhinitis: Secondary | ICD-10-CM

## 2011-03-28 DIAGNOSIS — R319 Hematuria, unspecified: Secondary | ICD-10-CM

## 2011-03-28 DIAGNOSIS — K219 Gastro-esophageal reflux disease without esophagitis: Secondary | ICD-10-CM

## 2011-03-28 DIAGNOSIS — I10 Essential (primary) hypertension: Secondary | ICD-10-CM

## 2011-03-28 DIAGNOSIS — Z23 Encounter for immunization: Secondary | ICD-10-CM

## 2011-03-28 LAB — POCT URINALYSIS DIPSTICK
Bilirubin, UA: NEGATIVE
Glucose, UA: NEGATIVE
Spec Grav, UA: 1.01

## 2011-03-28 MED ORDER — LORATADINE 10 MG PO TABS: 10.0000 mg | ORAL_TABLET | Freq: Every day | ORAL | Status: DC

## 2011-03-28 MED ORDER — MOMETASONE FUROATE 50 MCG/ACT NA SUSP: 2.0000 | Freq: Every day | NASAL | Status: DC

## 2011-03-28 MED ORDER — ESOMEPRAZOLE MAGNESIUM 40 MG PO CPDR: 40.0000 mg | DELAYED_RELEASE_CAPSULE | Freq: Every day | ORAL | Status: DC

## 2011-03-28 MED ORDER — VALSARTAN-HYDROCHLOROTHIAZIDE 320-12.5 MG PO TABS: ORAL_TABLET | ORAL | Status: DC

## 2011-03-28 NOTE — Progress Notes (Signed)
  Subjective:    Patient here for follow-up of elevated blood pressure.  He is not exercising and is adherent to a low-salt diet.  Blood pressure is well controlled at home. Cardiac symptoms: none. Patient denies: chest pain, chest pressure/discomfort, claudication, dyspnea, exertional chest pressure/discomfort, fatigue, irregular heart beat, lower extremity edema, near-syncope, orthopnea, palpitations, paroxysmal nocturnal dyspnea, syncope and tachypnea. Cardiovascular risk factors: dyslipidemia, hypertension, male gender, obesity (BMI >= 30 kg/m2) and sedentary lifestyle. Use of agents associated with hypertension: none. History of target organ damage: none.  The following portions of the patient's history were reviewed and updated as appropriate: allergies, current medications, past family history, past medical history, past social history, past surgical history and problem list.  Review of Systems Pertinent items are noted in HPI.     Objective:    BP 126/84  Pulse 83  Temp(Src) 98.7 F (37.1 C) (Oral)  Wt 262 lb (118.842 kg)  SpO2 97% General appearance: alert, cooperative, appears stated age and no distress Lungs: clear to auscultation bilaterally Heart: regular rate and rhythm, S1, S2 normal, no murmur, click, rub or gallop Extremities: extremities normal, atraumatic, no cyanosis or edema    Assessment:    Hypertension, normal blood pressure . Evidence of target organ damage: none.   gerd---  Refill meds  Plan:    Medication: no change. Dietary sodium restriction. Regular aerobic exercise. Check blood pressures 2-3 times weekly and record. Follow up: 3 months and as needed.

## 2011-03-28 NOTE — Progress Notes (Signed)
Addended by: Candie Echevaria L on: 03/28/2011 03:56 PM   Modules accepted: Orders

## 2011-03-28 NOTE — Progress Notes (Signed)
  Subjective:    Patient ID: Tom Fuller, male    DOB: 1950-10-01, 60 y.o.   MRN: [id number]  HPI    Review of Systems     Objective:   Physical Exam        Assessment & Plan:  1 OM---resolved, con't antihistamine and nasonex prn

## 2011-03-28 NOTE — Progress Notes (Signed)
Addended by: [name] on: 03/28/2011 03:40 PM   Modules accepted: Orders

## 2011-03-28 NOTE — Patient Instructions (Signed)

## 2011-03-29 LAB — LIPID PANEL
Cholesterol: 149 mg/dL (ref 0–200)
Triglycerides: 83 mg/dL (ref 0.0–149.0)

## 2011-03-29 LAB — HEPATIC FUNCTION PANEL
ALT: 19 U/L (ref 0–53)
Albumin: 4.2 g/dL (ref 3.5–5.2)
Total Protein: 7.3 g/dL (ref 6.0–8.3)

## 2011-03-29 LAB — BASIC METABOLIC PANEL
BUN: 20 mg/dL (ref 6–23)
CO2: 29 mEq/L (ref 19–32)
Chloride: 103 mEq/L (ref 96–112)
Creatinine, Ser: 0.8 mg/dL (ref 0.4–1.5)

## 2011-03-30 LAB — URINE CULTURE: Organism ID, Bacteria: NO GROWTH

## 2011-04-18 ENCOUNTER — Encounter: Payer: Self-pay | Admitting: Family Medicine

## 2011-04-18 ENCOUNTER — Ambulatory Visit (INDEPENDENT_AMBULATORY_CARE_PROVIDER_SITE_OTHER): Payer: BC Managed Care – PPO | Admitting: Family Medicine

## 2011-04-18 VITALS — BP 132/70 | HR 65 | Temp 98.5°F | Wt 262.2 lb

## 2011-04-18 DIAGNOSIS — L089 Local infection of the skin and subcutaneous tissue, unspecified: Secondary | ICD-10-CM

## 2011-04-18 MED ORDER — CEFTRIAXONE SODIUM 1 G IJ SOLR
1.0000 g | Freq: Once | INTRAMUSCULAR | Status: AC
Start: 2011-04-18 — End: 2011-04-18
  Administered 2011-04-18: 1 g via INTRAMUSCULAR

## 2011-04-18 MED ORDER — CEPHALEXIN 500 MG PO CAPS: 500.0000 mg | ORAL_CAPSULE | Freq: Four times a day (QID) | ORAL | Status: AC

## 2011-04-18 NOTE — Patient Instructions (Signed)
Wound Infection A wound infection happens when a type of germ (bacteria) starts growing in the wound. In some cases, this can cause the wound to break open. If cared for properly, the infected wound will heal from the inside to the outside. Wound infections need treatment. CAUSES An infection is caused by bacteria growing in the wound.  SYMPTOMS   Increase in redness, swelling, or pain at the wound site.   Increase in drainage at the wound site.   Wound or bandage (dressing) starts to smell bad.   Fever.   Feeling tired or fatigued.   Pus draining from the wound.  TREATMENT  You caregiver will prescribe antibiotic medicine. The wound infection should improve within 24 to 48 hours. Any redness around the wound should stop spreading and the wound should be less painful.  HOME CARE INSTRUCTIONS   Only take over-the-counter or prescription medicines for pain, discomfort, or fever as directed by your caregiver.   Take your antibiotics as directed. Finish them even if you start to feel better.   Gently wash the area with mild soap and water 2 times a day, or as directed. Rinse off the soap. Pat the area dry with a clean towel. Do not rub the wound. This may cause bleeding.   Follow your caregiver's instructions for how often you need to change the dressing.   Apply ointment and a dressing to the wound as directed.   If the dressing sticks, moisten it with soapy water and gently remove it.   Change the bandage right away if it becomes wet, dirty, or develops a bad smell.   Take showers. Do not take tub baths, swim, or do anything that may soak the wound until it is healed.   Avoid exercises that make you sweat heavily.   Use anti-itch medicine as directed by your caregiver. The wound may itch when it is healing. Do not pick or scratch at the wound.   Follow up with your caregiver to get your wound rechecked as directed.  SEEK MEDICAL CARE IF:  You have an increase in swelling,  pain, or redness around the wound.   You have an increase in the amount of pus coming from the wound.   There is a bad smell coming from the wound.   More of the wound breaks open.   You have a fever.  MAKE SURE YOU:   Understand these instructions.   Will watch your condition.   Will get help right away if you are not doing well or get worse.  Document Released: 02/12/2003 Document Revised: 01/26/2011 Document Reviewed: 09/19/2010 [employer] Patient Information 2012 [employer]. 

## 2011-04-18 NOTE — Progress Notes (Signed)
  Subjective:    Patient ID: Tom Fuller, male    DOB: 1950/11/19, 60 y.o.   MRN: [id number]  HPI  Pt here c/o wound on L shin 3 weeks ago.  He tried to jump up onto brick wall and he hit brick wall.  Wound is not healing and smells.  No fevers, no calf pain.  Review of Systems As above    Objective:   Physical Exam  Constitutional: He is oriented to person, place, and time. He appears well-developed and well-nourished.  Neurological: He is alert and oriented to person, place, and time.  Skin: There is erythema.       + quarter size wound L shin---+ yellow /green drainage, + odor ----+ surrounding errythema No calf pain  Psychiatric: He has a normal mood and affect. His behavior is normal.          Assessment & Plan:  Wound infection L shin---keflex for 10 days                                             Rocephin given                                         Wound covered                                       rto Wed for recheck or sooner prn

## 2011-04-19 ENCOUNTER — Encounter: Payer: Self-pay | Admitting: Family Medicine

## 2011-04-20 ENCOUNTER — Encounter: Payer: Self-pay | Admitting: Family Medicine

## 2011-04-20 ENCOUNTER — Ambulatory Visit (INDEPENDENT_AMBULATORY_CARE_PROVIDER_SITE_OTHER): Payer: BC Managed Care – PPO | Admitting: Family Medicine

## 2011-04-20 VITALS — BP 130/70 | HR 75 | Temp 98.6°F | Wt 262.0 lb

## 2011-04-20 DIAGNOSIS — S81009A Unspecified open wound, unspecified knee, initial encounter: Secondary | ICD-10-CM

## 2011-04-20 DIAGNOSIS — S81802A Unspecified open wound, left lower leg, initial encounter: Secondary | ICD-10-CM

## 2011-04-20 MED ORDER — CEFTRIAXONE SODIUM 1 G IJ SOLR
2.0000 g | Freq: Once | INTRAMUSCULAR | Status: AC
  Administered 2011-04-20: 2 g via INTRAMUSCULAR

## 2011-04-20 NOTE — Progress Notes (Signed)
  Subjective:    Patient ID: Tom Fuller, male    DOB: Nov 17, 1950, 60 y.o.   MRN: [id number]  HPI  Pt here for recheck leg.  Pt states he thinks it is getting better but it is still draining.    Review of Systems As above    Objective:   Physical Exam  Constitutional: He is oriented to person, place, and time. He appears well-developed and well-nourished.  Cardiovascular: Normal rate, regular rhythm and normal heart sounds.   No murmur heard. Neurological: He is alert and oriented to person, place, and time.  Skin:       + wound L shin--- healing but still with some drainage---? Sinus tract Some surrounding errythema but much improved  Psychiatric: He has a normal mood and affect. His behavior is normal. Judgment and thought content normal.          Assessment & Plan:  1  Wound L leg--- rocephin                                Finish abx                         Wound center referral

## 2011-04-20 NOTE — Progress Notes (Signed)
Addended by: Arnette Norris on: 04/20/2011 03:09 PM   Modules accepted: Orders

## 2011-04-21 LAB — WOUND CULTURE: Gram Stain: NONE SEEN

## 2011-04-26 ENCOUNTER — Encounter: Payer: Self-pay | Admitting: Family Medicine

## 2011-04-26 ENCOUNTER — Ambulatory Visit (INDEPENDENT_AMBULATORY_CARE_PROVIDER_SITE_OTHER): Payer: BC Managed Care – PPO | Admitting: Family Medicine

## 2011-04-26 VITALS — BP 134/78 | HR 85 | Temp 98.6°F | Wt 261.0 lb

## 2011-04-26 DIAGNOSIS — L02419 Cutaneous abscess of limb, unspecified: Secondary | ICD-10-CM

## 2011-04-26 DIAGNOSIS — L03119 Cellulitis of unspecified part of limb: Secondary | ICD-10-CM

## 2011-04-26 NOTE — Patient Instructions (Signed)

## 2011-04-26 NOTE — Progress Notes (Signed)
  Subjective:    Patient ID: Tom Fuller, male    DOB: [birth year], 60 y.o.   MRN: [id number]  HPI Pt here to f/u [facility]  Leg cellulitis----  Pt feels it may be getting better but its still draining.   Review of Systems As above    Objective:   Physical Exam  Constitutional: He is oriented to person, place, and time. He appears well-developed and well-nourished.  Neurological: He is alert and oriented to person, place, and time.  Skin:       + errythema and ulcer L shin--- + green/yellow drainage No calf pain + ? Sinus tract  Psychiatric: He has a normal mood and affect. His behavior is normal. Judgment and thought content normal.          Assessment & Plan:  Cellulitis--finish abx and f/u wound clinic                  rto prn  -- check xray

## 2011-04-27 ENCOUNTER — Encounter: Payer: Self-pay | Admitting: *Deleted

## 2011-04-28 ENCOUNTER — Ambulatory Visit (INDEPENDENT_AMBULATORY_CARE_PROVIDER_SITE_OTHER): Payer: BC Managed Care – PPO | Admitting: Internal Medicine

## 2011-04-28 ENCOUNTER — Other Ambulatory Visit: Payer: Self-pay | Admitting: Oncology

## 2011-04-28 ENCOUNTER — Ambulatory Visit (INDEPENDENT_AMBULATORY_CARE_PROVIDER_SITE_OTHER)
Admission: RE | Admit: 2011-04-28 | Discharge: 2011-04-28 | Disposition: A | Discharge status: Home / Self Care | Payer: BC Managed Care – PPO | Source: Ambulatory Visit | Attending: Family Medicine | Admitting: Family Medicine

## 2011-04-28 ENCOUNTER — Ambulatory Visit (HOSPITAL_COMMUNITY)
Admission: RE | Admit: 2011-04-28 | Discharge: 2011-04-28 | Disposition: A | Discharge status: Home / Self Care | Payer: BC Managed Care – PPO | Source: Ambulatory Visit | Attending: Oncology | Admitting: Oncology

## 2011-04-28 ENCOUNTER — Other Ambulatory Visit (HOSPITAL_BASED_OUTPATIENT_CLINIC_OR_DEPARTMENT_OTHER): Payer: BC Managed Care – PPO | Admitting: Lab

## 2011-04-28 ENCOUNTER — Encounter (HOSPITAL_COMMUNITY): Payer: Self-pay

## 2011-04-28 ENCOUNTER — Encounter: Payer: Self-pay | Admitting: Internal Medicine

## 2011-04-28 VITALS — BP 112/60 | HR 84 | Ht 71.0 in | Wt 261.0 lb

## 2011-04-28 DIAGNOSIS — C8589 Other specified types of non-Hodgkin lymphoma, extranodal and solid organ sites: Secondary | ICD-10-CM | POA: Insufficient documentation

## 2011-04-28 DIAGNOSIS — R599 Enlarged lymph nodes, unspecified: Secondary | ICD-10-CM | POA: Insufficient documentation

## 2011-04-28 DIAGNOSIS — I10 Essential (primary) hypertension: Secondary | ICD-10-CM

## 2011-04-28 DIAGNOSIS — I4891 Unspecified atrial fibrillation: Secondary | ICD-10-CM

## 2011-04-28 DIAGNOSIS — C8588 Other specified types of non-Hodgkin lymphoma, lymph nodes of multiple sites: Secondary | ICD-10-CM

## 2011-04-28 DIAGNOSIS — M25569 Pain in unspecified knee: Secondary | ICD-10-CM

## 2011-04-28 DIAGNOSIS — L03119 Cellulitis of unspecified part of limb: Secondary | ICD-10-CM

## 2011-04-28 DIAGNOSIS — C8590 Non-Hodgkin lymphoma, unspecified, unspecified site: Secondary | ICD-10-CM

## 2011-04-28 DIAGNOSIS — R911 Solitary pulmonary nodule: Secondary | ICD-10-CM | POA: Insufficient documentation

## 2011-04-28 DIAGNOSIS — L02419 Cutaneous abscess of limb, unspecified: Secondary | ICD-10-CM

## 2011-04-28 LAB — CMP (CANCER CENTER ONLY)
ALT(SGPT): 25 U/L (ref 10–47)
AST: 25 U/L (ref 11–38)
Alkaline Phosphatase: 71 U/L (ref 26–84)
CO2: 31 mEq/L (ref 18–33)
Creat: 1.1 mg/dl (ref 0.6–1.2)
Sodium: 140 mEq/L (ref 128–145)
Total Bilirubin: 0.7 mg/dl (ref 0.20–1.60)
Total Protein: 7.1 g/dL (ref 6.4–8.1)

## 2011-04-28 LAB — CBC WITH DIFFERENTIAL/PLATELET
BASO%: 0.8 % (ref 0.0–2.0)
Basophils Absolute: 0.1 10*3/uL (ref 0.0–0.1)
EOS%: 5.4 % (ref 0.0–7.0)
HGB: 14.9 g/dL (ref 13.0–17.1)
MCH: 31.5 pg (ref 27.2–33.4)
MCHC: 34.9 g/dL (ref 32.0–36.0)
RDW: 13.1 % (ref 11.0–14.6)
WBC: 7.2 10*3/uL (ref 4.0–10.3)
lymph#: 1.3 10*3/uL (ref 0.9–3.3)

## 2011-04-28 LAB — LACTATE DEHYDROGENASE: LDH: 129 U/L (ref 94–250)

## 2011-04-28 MED ORDER — IOHEXOL 300 MG/ML  SOLN
100.0000 mL | Freq: Once | INTRAMUSCULAR | Status: AC | PRN
  Administered 2011-04-28: 100 mL via INTRAVENOUS

## 2011-04-28 MED ORDER — VALSARTAN-HYDROCHLOROTHIAZIDE 320-12.5 MG PO TABS: ORAL_TABLET | ORAL | Status: DC

## 2011-04-28 MED ORDER — METOPROLOL SUCCINATE ER 50 MG PO TB24: 50.0000 mg | ORAL_TABLET | Freq: Every day | ORAL | Status: DC

## 2011-04-28 NOTE — Assessment & Plan Note (Signed)
He appears to have had some cellulitis and he also has some venous insufficiency. Will follow. He is going to a wound clinic. I have asked the patient to keep his leg elevated.

## 2011-04-28 NOTE — Assessment & Plan Note (Signed)
His ventricular rate appears to be fairly well controlled. He will continue his current meds.

## 2011-04-28 NOTE — Progress Notes (Signed)
HPI Mr. Stratton returns today for followup. He is a pleasant 60 yo man with a h/o atrial fib, HTN, chronic peripheral edema. No c/p or sob. He has been bothered by cellulitis. No fever or chills.  No Known Allergies   Current Outpatient Prescriptions  Medication Sig Dispense Refill  . aspirin 325 MG EC tablet Take 325 mg by mouth daily.        . celecoxib (CELEBREX) 200 MG capsule Take 200 mg by mouth daily.        . cephALEXin (KEFLEX) 500 MG capsule Take 1 capsule (500 mg total) by mouth 4 (four) times daily.  40 capsule  0  . esomeprazole (NEXIUM) 40 MG capsule Take 1 capsule (40 mg total) by mouth daily. Office visit due now  90 capsule  3  . fish oil-omega-3 fatty acids 1000 MG capsule Take 2 g by mouth 2 (two) times daily.        . metoprolol (TOPROL-XL) 50 MG 24 hr tablet Take 1 tablet (50 mg total) by mouth daily.  90 tablet  3  . valsartan-hydrochlorothiazide (DIOVAN HCT) 320-12.5 MG per tablet 1 po qd  90 tablet  3  . DISCONTD: metoprolol (TOPROL-XL) 50 MG 24 hr tablet Take 1 tablet (50 mg total) by mouth daily.  30 tablet  6  . DISCONTD: valsartan-hydrochlorothiazide (DIOVAN HCT) 320-12.5 MG per tablet 1 po qd  90 tablet  3   No current facility-administered medications for this visit.   Facility-Administered Medications Ordered in Other Visits  Medication Dose Route Frequency Provider Last Rate Last Dose  . iohexol (OMNIPAQUE) 300 MG/ML injection 100 mL  100 mL Intravenous Once PRN Medication Radiologist   100 mL at 04/28/11 1005     Past Medical History  Diagnosis Date  . Atrial fibrillation   . Lymphoma, follicular     Dr.Shadad  . Cervical lymphadenopathy   . GERD (gastroesophageal reflux disease)   . Erectile dysfunction   . Hypertension     ROS:   All systems reviewed and negative except as noted in the HPI.   Past Surgical History  Procedure Date  . Vasectomy      Family History  Problem Relation Age of Onset  . Hypertension Mother   . Kidney disease  Mother   . Hypertension Father   . Hypertension Brother   . Kidney disease Maternal Aunt   . Kidney disease Maternal Grandmother   . Diabetes Paternal Grandfather   . Hypertension Paternal Grandfather      History   Social History  . Marital Status: Married    Spouse Name: N/A    Number of Children: N/A  . Years of Education: N/A   Occupational History  . Not on file.   Social History Main Topics  . Smoking status: Never Smoker   . Smokeless tobacco: Never Used  . Alcohol Use: No  . Drug Use: No  . Sexually Active: Yes -- Male partner(s)   Other Topics Concern  . Not on file   Social History Narrative  . No narrative on file     BP 112/60  Pulse 84  Ht 5\' 11"  (1.803 m)  Wt 118.389 kg (261 lb)  BMI 36.40 kg/m2  Physical Exam:  Well appearing NAD HEENT: Unremarkable Neck:  No JVD, no thyromegally Lymphatics:  No adenopathy Back:  No CVA tenderness Lungs:  Clear with no wheezes. HEART: IRegular rate rhythm, no murmurs, no rubs, no clicks Abd:  soft, positive bowel sounds,  no organomegally, no rebound, no guarding Ext:  2 plus pulses, peripheral edema bilaterally, no cyanosis, no clubbing Skin:  No rashes no nodules.  Neuro:  CN II through XII intact, motor grossly intact  EKG Atrial fibrillation with an CVR.   Assess/Plan:

## 2011-04-28 NOTE — Patient Instructions (Signed)
Your physician wants you to follow-up in: 12 months with Dr. Taylor. You will receive a reminder letter in the mail two months in advance. If you don't receive a letter, please call our office to schedule the follow-up appointment.    

## 2011-05-05 ENCOUNTER — Encounter (HOSPITAL_BASED_OUTPATIENT_CLINIC_OR_DEPARTMENT_OTHER): Payer: BC Managed Care – PPO | Attending: Internal Medicine

## 2011-05-05 DIAGNOSIS — Z7982 Long term (current) use of aspirin: Secondary | ICD-10-CM | POA: Insufficient documentation

## 2011-05-05 DIAGNOSIS — S91009A Unspecified open wound, unspecified ankle, initial encounter: Secondary | ICD-10-CM | POA: Insufficient documentation

## 2011-05-05 DIAGNOSIS — C8581 Other specified types of non-Hodgkin lymphoma, lymph nodes of head, face, and neck: Secondary | ICD-10-CM | POA: Insufficient documentation

## 2011-05-05 DIAGNOSIS — IMO0002 Reserved for concepts with insufficient information to code with codable children: Secondary | ICD-10-CM | POA: Insufficient documentation

## 2011-05-05 DIAGNOSIS — A4901 Methicillin susceptible Staphylococcus aureus infection, unspecified site: Secondary | ICD-10-CM | POA: Insufficient documentation

## 2011-05-05 DIAGNOSIS — I872 Venous insufficiency (chronic) (peripheral): Secondary | ICD-10-CM | POA: Insufficient documentation

## 2011-05-05 DIAGNOSIS — K219 Gastro-esophageal reflux disease without esophagitis: Secondary | ICD-10-CM | POA: Insufficient documentation

## 2011-05-05 DIAGNOSIS — Z79899 Other long term (current) drug therapy: Secondary | ICD-10-CM | POA: Insufficient documentation

## 2011-05-05 DIAGNOSIS — I10 Essential (primary) hypertension: Secondary | ICD-10-CM | POA: Insufficient documentation

## 2011-05-05 DIAGNOSIS — S81009A Unspecified open wound, unspecified knee, initial encounter: Secondary | ICD-10-CM | POA: Insufficient documentation

## 2011-05-05 NOTE — Progress Notes (Signed)
[facility]  NAME:  Tom Fuller, Tom Fuller NO.:  [id number]  MEDICAL RECORD NO.:  [id number]      DATE OF BIRTH:  24-Oct-1950  PHYSICIAN:  Maxwell Caul, [name] VISIT DATE:  05/05/2011                                  OFFICE VISIT   Tom Fuller is a 60 year old man who was sent here for consultation with regard to a traumatic wound on his anterior left leg.  The history is that he was essentially jumping over a brick wall 3 weeks ago, he traumatized his anterior leg.  He was seen on April 20, 2011, at his primary physician's office, Dr. Loreen Freud.  Culture at this time grew Staph and he was treated with Rocephin, changing to Keflex, presumably methicillin sensitive, although I do not see the final results.  The patient has a history of venous stasis, but has not had a prior history of wounds.  He is not a diabetic but has recently been diagnosed with lymphoma, predominantly involving the nodes in his cervical region.  He is to be seen by Oncology shortly.  PAST MEDICAL HISTORY:  Hypertension, gastroesophageal reflux, and lymphoma, apparently follicular cell per the patient.  MEDICATIONS:  List is reviewed.  He is on ASA, Celexa, Keflex, metoprolol, Nexium, Diovan, and fish oil.  REVIEW OF SYSTEMS:  GENERAL:  He does not complain of fever, chills. RESPIRATORY:  No shortness of breath.  CARDIAC:  No chest pain.  PHYSICAL EXAMINATION:  RESPIRATORY:  Clear air entry bilaterally. CARDIAC:  Heart sounds are normal.  There is no murmurs. ABDOMEN:  No spleen felt. LYMPHATICS:  He has several nodes in his cervical chain bilaterally, especially on the left side. EXTREMITIES:  His peripheral pulses are intact.  ABIs were calculated here at 1.26.  He has bilateral venous stasis.  WOUND EXAM:  The wound is on his left shin, measures 0.7 x 0.5 x 1 with 2 cm of tunneling superiorly, roughly 9-12 o'clock.  The wound was debrided of surrounding eschar  with a curette.  He tolerates this well. The base of this wound was recultured.  I did not change his current antibiotics today.  IMPRESSION:  Traumatic wound, left anterior leg.  Unfortunately, this is a fairly deep wound with a significant amount of superior tunneling.  We packed this with Aquacel Ag and wrapped him in a Profore Lite wrap.  We are going to leave this on for a week.  I did re-culture the area, however, I did not currently adjust antibiotics.  Wounds like this, especially with the tunneling can be difficult to heal and I talked about this with him in some detail.  He has venous stasis physiology, although the wound is superior to most of this.          ______________________________ Maxwell Caul, [name]     MGR/MEDQ  D:  05/05/2011  T:  05/05/2011  Job:  [id number]

## 2011-05-06 ENCOUNTER — Telehealth: Payer: Self-pay | Admitting: Oncology

## 2011-05-06 ENCOUNTER — Ambulatory Visit (HOSPITAL_BASED_OUTPATIENT_CLINIC_OR_DEPARTMENT_OTHER): Payer: BC Managed Care – PPO | Admitting: Oncology

## 2011-05-06 VITALS — BP 124/80 | HR 88 | Temp 98.6°F | Ht 71.0 in | Wt 260.6 lb

## 2011-05-06 DIAGNOSIS — R599 Enlarged lymph nodes, unspecified: Secondary | ICD-10-CM

## 2011-05-06 DIAGNOSIS — C8298 Follicular lymphoma, unspecified, lymph nodes of multiple sites: Secondary | ICD-10-CM

## 2011-05-06 DIAGNOSIS — C8590 Non-Hodgkin lymphoma, unspecified, unspecified site: Secondary | ICD-10-CM

## 2011-05-06 NOTE — Telephone Encounter (Signed)
gve the pt his march 2013 appt calendar °

## 2011-05-06 NOTE — Progress Notes (Signed)
Hematology and Oncology Follow Up Visit  Tom Fuller [id number] 10-15-1950 60 y.o. 05/06/2011 1:56 PM    CC: Tom Fuller. Tom Ridgel, [name], [name], [name]: A 60 year old gentleman with stage IIIA low-grade follicular lymphoma diagnosed May 2010.    Prior Therapy: NONE  Current therapy: :  Observation, surveillance.  Interim History:  [name] presents today for a followup visit.  This is a pleasant gentleman with follicular lymphoma.  He has disease above and below the diaphragm.  He has cervical adenopathy as well as intra-abdominal adenopathy.  Had been asymptomatic from his disease and has deferred treatments until he is symptomatic.  He had not reported any bulky adenopathy.  Did not report any dysphagia.  Did not report any odynophagia.  Did not report any fevers.  Did not report any abdominal distension or early satiety.  He is actually working full-time and really no deterioration in his health.  No evidence of any end-organ damage. He did report a lower leg injury that left him with non-healing wound and he is seeing the wound clinic at this time.   Medications: I have reviewed the patient's current medications. Current outpatient prescriptions:aspirin 325 MG EC tablet, Take 325 mg by mouth daily.  , Disp: , Rfl: ;  celecoxib (CELEBREX) 200 MG capsule, Take 200 mg by mouth daily.  , Disp: , Rfl: ;  esomeprazole (NEXIUM) 40 MG capsule, Take 1 capsule (40 mg total) by mouth daily. Office visit due now, Disp: 90 capsule, Rfl: 3;  fish oil-omega-3 fatty acids 1000 MG capsule, Take 2 g by mouth 2 (two) times daily.  , Disp: , Rfl:  metoprolol (TOPROL-XL) 50 MG 24 hr tablet, Take 1 tablet (50 mg total) by mouth daily., Disp: 90 tablet, Rfl: 3;  valsartan-hydrochlorothiazide (DIOVAN HCT) 320-12.5 MG per tablet, 1 po qd, Disp: 90 tablet, Rfl: 3  Allergies: No Known Allergies  Past Medical History, Surgical history, Social history, and Family  History were reviewed and updated.  Review of Systems: Constitutional:  Negative for fever, chills, night sweats, anorexia, weight loss, pain. Cardiovascular: no chest pain or dyspnea on exertion Respiratory: no cough, shortness of breath, or wheezing Neurological: no TIA or stroke symptoms Dermatological: negative ENT: negative Skin: Negative. Gastrointestinal: no abdominal pain, change in bowel habits, or black or bloody stools Genito-Urinary: no dysuria, trouble voiding, or hematuria Hematological and Lymphatic: negative Breast: negative Musculoskeletal: negative Remaining ROS negative. Physical Exam: Blood pressure 124/80, pulse 88, temperature 98.6 F (37 C), temperature source Oral, height 5\' 11"  (1.803 m), weight 260 lb 9.6 oz (118.207 kg). ECOG: 0 General appearance: alert Head: Normocephalic, without obvious abnormality, atraumatic Neck: no adenopathy, no carotid bruit, no JVD, supple, symmetrical, trachea midline and thyroid not enlarged, symmetric, no tenderness/mass/nodules Lymph nodes: Cervical, supraclavicular, and axillary nodes normal. Heart:regular rate and rhythm, S1, S2 normal, no murmur, click, rub or gallop Lung:chest clear, no wheezing, rales, normal symmetric air entry,  Abdomin: soft, non-tender, without masses or organomegaly EXT:no erythema, induration, or nodules   Lab Results: Lab Results  Component Value Date   WBC 7.2 04/28/2011   HGB 14.9 04/28/2011   HCT 42.7 04/28/2011   MCV 90.2 04/28/2011   PLT 260 04/28/2011     Chemistry      Component Value Date/Time   NA 140 04/28/2011 0921   NA 141 03/28/2011 1525   K 4.4 04/28/2011 0921   K 4.7 03/28/2011 1525   CL  101 04/28/2011 0921   CL 103 03/28/2011 1525   CO2 31 04/28/2011 0921   CO2 29 03/28/2011 1525   BUN 18 04/28/2011 0921   BUN 20 03/28/2011 1525   CREATININE 1.1 04/28/2011 0921   CREATININE 0.8 03/28/2011 1525      Component Value Date/Time   CALCIUM 8.5 04/28/2011 0921    CALCIUM 9.7 03/28/2011 1525   ALKPHOS 71 04/28/2011 0921   ALKPHOS 57 03/28/2011 1525   AST 25 04/28/2011 0921   AST 23 03/28/2011 1525   ALT 19 03/28/2011 1525   BILITOT 0.70 04/28/2011 0921   BILITOT 0.6 03/28/2011 1525       Radiological Studies: CT neck 04/28/2011 IMPRESSION:  Stable bulky lymphadenopathy within the neck and submental  stations.  CT CHEST: IMPRESSION:  1. Stable mild supraclavicular lymphadenopathy.  2. Stable mild mediastinal lymphadenopathy.  3. Stable right lower lobe pulmonary nodule.   CT A/P:   IMPRESSION:  1. Interval mild increase in size of central mesenteric nodal  mass.  2. Interval mild increase in size of periaortic and common iliac  Lymphadenopathy.     Impression and Plan: This a pleasant 60 year old gentleman with the following issues: 1. Stage III follicular lymphoma.  He is now developing more bulky adenopathy as well as bulky neck adenopathy.  He is asymptomatic from that standpoint.  However, I feel given the bulk of his tumor, I think it is reasonable to consider starting treatments in the near future.  [name] would like to defer that at least for the time being.  I think it is reasonable at this time. I think he is in actual good health and shape, and [name] would tolerate a combination of bendamustine and rituximab without any major complication. I will waite till his wound is fully healed before proceeding with the treatment.  2. Atrial fibrillation.  He is normal sinus rhythm at this point, followed by cardiologist.     3. Non-healing wound: Under care of the wound clinic.      Tom Wilden, MD 12/7/20121:56 PM

## 2011-06-02 ENCOUNTER — Encounter (HOSPITAL_BASED_OUTPATIENT_CLINIC_OR_DEPARTMENT_OTHER): Payer: BC Managed Care – PPO

## 2011-06-02 ENCOUNTER — Encounter (HOSPITAL_BASED_OUTPATIENT_CLINIC_OR_DEPARTMENT_OTHER): Payer: BC Managed Care – PPO | Attending: Internal Medicine

## 2011-06-02 DIAGNOSIS — L97809 Non-pressure chronic ulcer of other part of unspecified lower leg with unspecified severity: Secondary | ICD-10-CM | POA: Insufficient documentation

## 2011-06-02 DIAGNOSIS — I872 Venous insufficiency (chronic) (peripheral): Secondary | ICD-10-CM | POA: Insufficient documentation

## 2011-08-04 ENCOUNTER — Ambulatory Visit (HOSPITAL_BASED_OUTPATIENT_CLINIC_OR_DEPARTMENT_OTHER): Payer: BC Managed Care – PPO | Admitting: Oncology

## 2011-08-04 ENCOUNTER — Ambulatory Visit (HOSPITAL_BASED_OUTPATIENT_CLINIC_OR_DEPARTMENT_OTHER): Payer: BC Managed Care – PPO | Admitting: Lab

## 2011-08-04 VITALS — BP 143/90 | HR 93 | Temp 98.4°F | Ht 71.0 in | Wt 263.0 lb

## 2011-08-04 DIAGNOSIS — I4891 Unspecified atrial fibrillation: Secondary | ICD-10-CM

## 2011-08-04 DIAGNOSIS — C8589 Other specified types of non-Hodgkin lymphoma, extranodal and solid organ sites: Secondary | ICD-10-CM

## 2011-08-04 DIAGNOSIS — C8299 Follicular lymphoma, unspecified, extranodal and solid organ sites: Secondary | ICD-10-CM

## 2011-08-04 DIAGNOSIS — C8590 Non-Hodgkin lymphoma, unspecified, unspecified site: Secondary | ICD-10-CM

## 2011-08-04 LAB — CBC WITH DIFFERENTIAL/PLATELET
Basophils Absolute: 0.1 10*3/uL (ref 0.0–0.1)
EOS%: 6.5 % (ref 0.0–7.0)
HCT: 43.1 % (ref 38.4–49.9)
HGB: 14.7 g/dL (ref 13.0–17.1)
MCH: 30.8 pg (ref 27.2–33.4)
NEUT%: 65.6 % (ref 39.0–75.0)
lymph#: 1.5 10*3/uL (ref 0.9–3.3)

## 2011-08-04 LAB — COMPREHENSIVE METABOLIC PANEL
AST: 20 U/L (ref 0–37)
BUN: 20 mg/dL (ref 6–23)
CO2: 27 mEq/L (ref 19–32)
Calcium: 9.6 mg/dL (ref 8.4–10.5)
Chloride: 102 mEq/L (ref 96–112)
Creatinine, Ser: 0.98 mg/dL (ref 0.50–1.35)
Glucose, Bld: 117 mg/dL — ABNORMAL HIGH (ref 70–99)

## 2011-08-04 NOTE — Progress Notes (Signed)
Hematology and Oncology Follow Up Visit  Tom Fuller [id number] 10/19/1950 61 y.o. 08/04/2011 3:46 PM    CC: [name], [name], [name], [name]: A 61 year old gentleman with stage IIIA low-grade follicular lymphoma diagnosed May 2010.    Prior Therapy: NONE  Current therapy:  Observation, surveillance.  Interim History:  Tom Fuller presents today for a followup visit.  This is a pleasant gentleman with follicular lymphoma.  He has disease above and below the diaphragm.  He has cervical adenopathy as well as intra-abdominal adenopathy.  Had been asymptomatic from his disease and has deferred treatments until he is symptomatic.  He had reported bulky adenopathy in the neck area since his last visit.  Did not report any dysphagia.  Did not report any odynophagia.  Did not report any fevers.  Did not report any abdominal distension or early satiety.  He is actually working full-time and really no deterioration in his health.  No evidence of any end-organ damage.    Medications: I have reviewed the patient's current medications. Current outpatient prescriptions:aspirin 325 MG EC tablet, Take 325 mg by mouth daily.  , Disp: , Rfl: ;  celecoxib (CELEBREX) 200 MG capsule, Take 200 mg by mouth daily.  , Disp: , Rfl: ;  esomeprazole (NEXIUM) 40 MG capsule, Take 1 capsule (40 mg total) by mouth daily. Office visit due now, Disp: 90 capsule, Rfl: 3;  fish oil-omega-3 fatty acids 1000 MG capsule, Take 2 g by mouth 2 (two) times daily.  , Disp: , Rfl:  metoprolol (TOPROL-XL) 50 MG 24 hr tablet, Take 1 tablet (50 mg total) by mouth daily., Disp: 90 tablet, Rfl: 3;  valsartan-hydrochlorothiazide (DIOVAN HCT) 320-12.5 MG per tablet, 1 po qd, Disp: 90 tablet, Rfl: 3  Allergies:  Allergies  Allergen Reactions  . Ampicillin     Past Medical History, Surgical history, Social history, and Family History were reviewed and updated.  Review of  Systems: Constitutional:  Negative for fever, chills, night sweats, anorexia, weight loss, pain. Cardiovascular: no chest pain or dyspnea on exertion Respiratory: no cough, shortness of breath, or wheezing Neurological: no TIA or stroke symptoms Dermatological: negative ENT: negative Skin: Negative. Gastrointestinal: no abdominal pain, change in bowel habits, or black or bloody stools Genito-Urinary: no dysuria, trouble voiding, or hematuria Hematological and Lymphatic: negative Breast: negative Musculoskeletal: negative Remaining ROS negative. Physical Exam: Blood pressure 143/90, pulse 93, temperature 98.4 F (36.9 C), temperature source Oral, height 5\' 11"  (1.803 m), weight 263 lb (119.296 kg). ECOG: 0 General appearance: alert Head: Normocephalic, without obvious abnormality, atraumatic Neck: no adenopathy, no carotid bruit, no JVD, supple, symmetrical, trachea midline and thyroid not enlarged, symmetric, no tenderness/mass/nodules Lymph nodes: Cervical adenopathy enlarged today (2-3cm B/L), supraclavicular, and axillary nodes normal. Heart:regular rate and rhythm, S1, S2 normal, no murmur, click, rub or gallop Lung:chest clear, no wheezing, rales, normal symmetric air entry,  Abdomin: soft, non-tender, without masses or organomegaly EXT:no erythema, induration, or nodules   Lab Results: Lab Results  Component Value Date   WBC 9.7 08/04/2011   HGB 14.7 08/04/2011   HCT 43.1 08/04/2011   MCV 90.4 08/04/2011   PLT 235 08/04/2011     Chemistry      Component Value Date/Time   NA 140 04/28/2011 0921   NA 141 03/28/2011 1525   K 4.4 04/28/2011 0921   K 4.7 03/28/2011 1525   CL 101 04/28/2011 0921   CL  103 03/28/2011 1525   CO2 31 04/28/2011 0921   CO2 29 03/28/2011 1525   BUN 18 04/28/2011 0921   BUN 20 03/28/2011 1525   CREATININE 1.1 04/28/2011 0921   CREATININE 0.8 03/28/2011 1525      Component Value Date/Time   CALCIUM 8.5 04/28/2011 0921   CALCIUM 9.7 03/28/2011 1525    ALKPHOS 71 04/28/2011 0921   ALKPHOS 57 03/28/2011 1525   AST 25 04/28/2011 0921   AST 23 03/28/2011 1525   ALT 19 03/28/2011 1525   BILITOT 0.70 04/28/2011 0921   BILITOT 0.6 03/28/2011 1525         Impression and Plan: This a pleasant 61 year old gentleman with the following issues: 1. Stage III follicular lymphoma.  He is now developing more bulky adenopathy as well as bulky neck adenopathy.  He is asymptomatic from that standpoint.  However, I feel given the bulk of his tumor, I think it is reasonable to consider starting treatments in the near future.  Tom Fuller would like to start treatment in the near future.  I think it is reasonable at this time. I think he is in actual good health and shape, and Tom Fuller would tolerate a combination of bendamustine and rituximab without any major complication. Risks and benefits discussed today. He will let me know when to start in the near future.  2. Atrial fibrillation.  He is normal sinus rhythm at this point, followed by cardiologist.     3.     Non-healing wound: Resolved now.     Eli Hose, MD 3/7/20133:46 PM

## 2011-08-09 DIAGNOSIS — C8290 Follicular lymphoma, unspecified, unspecified site: Secondary | ICD-10-CM | POA: Insufficient documentation

## 2011-08-10 ENCOUNTER — Other Ambulatory Visit: Payer: Self-pay | Admitting: Oncology

## 2011-08-10 DIAGNOSIS — C8290 Follicular lymphoma, unspecified, unspecified site: Secondary | ICD-10-CM

## 2011-08-11 ENCOUNTER — Telehealth: Payer: Self-pay | Admitting: Oncology

## 2011-08-11 NOTE — Telephone Encounter (Signed)
S/w wife re appt for 3/28 @ 8 am. Pt to get schedule when he come in 3/28.

## 2011-08-16 ENCOUNTER — Other Ambulatory Visit: Payer: Self-pay | Admitting: Radiology

## 2011-08-17 ENCOUNTER — Other Ambulatory Visit: Payer: Self-pay | Admitting: Physician Assistant

## 2011-08-18 ENCOUNTER — Telehealth (HOSPITAL_COMMUNITY): Payer: Self-pay | Admitting: Oncology

## 2011-08-19 ENCOUNTER — Other Ambulatory Visit: Payer: Self-pay | Admitting: Oncology

## 2011-08-19 ENCOUNTER — Ambulatory Visit (HOSPITAL_COMMUNITY)
Admission: RE | Admit: 2011-08-19 | Discharge: 2011-08-19 | Disposition: A | Discharge status: Home / Self Care | Payer: BC Managed Care – PPO | Source: Ambulatory Visit | Attending: Oncology | Admitting: Oncology

## 2011-08-19 VITALS — BP 107/55 | HR 71 | Temp 98.6°F | Resp 21

## 2011-08-19 DIAGNOSIS — C8589 Other specified types of non-Hodgkin lymphoma, extranodal and solid organ sites: Secondary | ICD-10-CM

## 2011-08-19 DIAGNOSIS — Z7982 Long term (current) use of aspirin: Secondary | ICD-10-CM | POA: Insufficient documentation

## 2011-08-19 DIAGNOSIS — C8590 Non-Hodgkin lymphoma, unspecified, unspecified site: Secondary | ICD-10-CM

## 2011-08-19 DIAGNOSIS — C8299 Follicular lymphoma, unspecified, extranodal and solid organ sites: Secondary | ICD-10-CM | POA: Insufficient documentation

## 2011-08-19 DIAGNOSIS — K219 Gastro-esophageal reflux disease without esophagitis: Secondary | ICD-10-CM | POA: Insufficient documentation

## 2011-08-19 DIAGNOSIS — Z79899 Other long term (current) drug therapy: Secondary | ICD-10-CM | POA: Insufficient documentation

## 2011-08-19 DIAGNOSIS — I10 Essential (primary) hypertension: Secondary | ICD-10-CM | POA: Insufficient documentation

## 2011-08-19 LAB — BASIC METABOLIC PANEL
BUN: 18 mg/dL (ref 6–23)
Chloride: 101 mEq/L (ref 96–112)
Glucose, Bld: 115 mg/dL — ABNORMAL HIGH (ref 70–99)
Potassium: 3.9 mEq/L (ref 3.5–5.1)

## 2011-08-19 LAB — CBC
HCT: 41.8 % (ref 39.0–52.0)
Hemoglobin: 15.1 g/dL (ref 13.0–17.0)
MCH: 31.4 pg (ref 26.0–34.0)
MCHC: 36.1 g/dL — ABNORMAL HIGH (ref 30.0–36.0)
MCV: 86.9 fL (ref 78.0–100.0)

## 2011-08-19 LAB — PROTIME-INR: INR: 1.06 (ref 0.00–1.49)

## 2011-08-19 MED ORDER — HEPARIN SOD (PORK) LOCK FLUSH 100 UNIT/ML IV SOLN
500.0000 [IU] | Freq: Once | INTRAVENOUS | Status: AC
  Administered 2011-08-19: 500 [IU] via INTRAVENOUS

## 2011-08-19 MED ORDER — FENTANYL CITRATE 0.05 MG/ML IJ SOLN
INTRAMUSCULAR | Status: AC | PRN
  Administered 2011-08-19 (×2): 100 ug via INTRAVENOUS

## 2011-08-19 MED ORDER — LIDOCAINE HCL 1 % IJ SOLN
INTRAMUSCULAR | Status: AC
  Filled 2011-08-19: qty 20

## 2011-08-19 MED ORDER — MIDAZOLAM HCL 2 MG/2ML IJ SOLN
INTRAMUSCULAR | Status: AC
  Filled 2011-08-19: qty 2

## 2011-08-19 MED ORDER — SODIUM CHLORIDE 0.9 % IV SOLN: Freq: Once | INTRAVENOUS | Status: DC

## 2011-08-19 MED ORDER — FENTANYL CITRATE 0.05 MG/ML IJ SOLN
INTRAMUSCULAR | Status: AC
  Filled 2011-08-19: qty 4

## 2011-08-19 MED ORDER — MIDAZOLAM HCL 5 MG/5ML IJ SOLN
INTRAMUSCULAR | Status: AC | PRN
  Administered 2011-08-19: 2 mg via INTRAVENOUS

## 2011-08-19 MED ORDER — LIDOCAINE-PRILOCAINE 2.5-2.5 % EX CREA: TOPICAL_CREAM | CUTANEOUS | Status: DC | PRN

## 2011-08-19 MED ORDER — VANCOMYCIN HCL 1000 MG IV SOLR
1500.0000 mg | INTRAVENOUS | Status: AC
  Administered 2011-08-19: 1500 mg via INTRAVENOUS
  Filled 2011-08-19: qty 1500

## 2011-08-19 NOTE — H&P (Signed)
Tom Fuller is an 61 y.o. male.   Chief Complaint: "I'm here for a port a cath" HPI: Patient with history of follicular lymphoma presents today for port a cath placement for chemotherapy.  Past Medical History  Diagnosis Date  . Atrial fibrillation   . Lymphoma, follicular     Dr.Shadad  . Cervical lymphadenopathy   . GERD (gastroesophageal reflux disease)   . Erectile dysfunction   . Hypertension     Past Surgical History  Procedure Date  . Vasectomy     Family History  Problem Relation Age of Onset  . Hypertension Mother   . Kidney disease Mother   . Hypertension Father   . Hypertension Brother   . Kidney disease Maternal Aunt   . Kidney disease Maternal Grandmother   . Diabetes Paternal Grandfather   . Hypertension Paternal Grandfather    Social History:  reports that he has never smoked. He has never used smokeless tobacco. He reports that he does not drink alcohol or use illicit drugs.  Allergies:  Allergies  Allergen Reactions  . Ampicillin     Medications Prior to Admission  Medication Sig Dispense Refill  . aspirin 325 MG EC tablet Take 325 mg by mouth daily.        . celecoxib (CELEBREX) 200 MG capsule Take 200 mg by mouth daily.        Marland Kitchen esomeprazole (NEXIUM) 40 MG capsule Take 1 capsule (40 mg total) by mouth daily. Office visit due now  90 capsule  3  . fish oil-omega-3 fatty acids 1000 MG capsule Take 2 g by mouth 2 (two) times daily.        . metoprolol (TOPROL-XL) 50 MG 24 hr tablet Take 1 tablet (50 mg total) by mouth daily.  90 tablet  3  . valsartan-hydrochlorothiazide (DIOVAN HCT) 320-12.5 MG per tablet 1 po qd  90 tablet  3   Medications Prior to Admission  Medication Dose Route Frequency Provider Last Rate Last Dose  . 0.9 %  sodium chloride infusion   Intravenous Once Brayton El, PA      . vancomycin (VANCOCIN) 1,500 mg in sodium chloride 0.9 % 500 mL IVPB  1,500 mg Intravenous to XRAY Brayton El, PA       Results for orders placed  during the hospital encounter of 08/19/11  CBC      Component Value Range   WBC 8.3  4.0 - 10.5 (K/uL)   RBC 4.81  4.22 - 5.81 (MIL/uL)   Hemoglobin 15.1  13.0 - 17.0 (g/dL)   HCT 84.1  32.4 - 40.1 (%)   MCV 86.9  78.0 - 100.0 (fL)   MCH 31.4  26.0 - 34.0 (pg)   MCHC 36.1 (*) 30.0 - 36.0 (g/dL)   RDW 02.7  25.3 - 66.4 (%)   Platelets 263  150 - 400 (K/uL)     Review of Systems  Constitutional: Negative for fever and chills.  Respiratory: Negative for cough and shortness of breath.   Cardiovascular: Negative for chest pain.  Gastrointestinal: Negative for nausea, vomiting and abdominal pain.  Neurological: Negative for headaches.  Endo/Heme/Allergies: Does not bruise/bleed easily.   Filed Vitals:   08/19/11 1008 08/19/11 1039  BP:  117/82  Pulse:  97  Temp: 98.6 F (37 C)   Resp:  18  SpO2:  97%     Physical Exam  Constitutional: He is oriented to person, place, and time. He appears well-developed and well-nourished.  Cardiovascular:  Irreg. irregular  Respiratory: Effort normal and breath sounds normal.  GI: Soft. Bowel sounds are normal.  Musculoskeletal: Normal range of motion.  Neurological: He is alert and oriented to person, place, and time.     Assessment/Plan Patient with follicular lymphoma; plan is for port a cath placement for chemotherapy. Details/risks of procedure discussed with patient with his understanding and consent.  Marilynne Dupuis,D KEVIN 08/19/2011, 10:30 AM

## 2011-08-19 NOTE — Discharge Instructions (Signed)

## 2011-08-19 NOTE — Procedures (Signed)
Placement of right IJ portacath.  Tip in SVC.  Ready to use.

## 2011-08-23 ENCOUNTER — Telehealth: Payer: Self-pay | Admitting: *Deleted

## 2011-08-23 DIAGNOSIS — R200 Anesthesia of skin: Secondary | ICD-10-CM

## 2011-08-23 MED ORDER — LIDOCAINE-PRILOCAINE 2.5-2.5 % EX CREA: TOPICAL_CREAM | CUTANEOUS | Status: DC | PRN

## 2011-08-23 NOTE — Telephone Encounter (Signed)
No note

## 2011-08-25 ENCOUNTER — Other Ambulatory Visit: Payer: Self-pay | Admitting: Emergency Medicine

## 2011-08-25 ENCOUNTER — Other Ambulatory Visit (HOSPITAL_BASED_OUTPATIENT_CLINIC_OR_DEPARTMENT_OTHER): Payer: BC Managed Care – PPO | Admitting: Lab

## 2011-08-25 ENCOUNTER — Ambulatory Visit (HOSPITAL_BASED_OUTPATIENT_CLINIC_OR_DEPARTMENT_OTHER): Payer: BC Managed Care – PPO

## 2011-08-25 VITALS — BP 113/76 | HR 114 | Temp 98.1°F

## 2011-08-25 DIAGNOSIS — C8299 Follicular lymphoma, unspecified, extranodal and solid organ sites: Secondary | ICD-10-CM

## 2011-08-25 DIAGNOSIS — C8290 Follicular lymphoma, unspecified, unspecified site: Secondary | ICD-10-CM

## 2011-08-25 DIAGNOSIS — Z5111 Encounter for antineoplastic chemotherapy: Secondary | ICD-10-CM

## 2011-08-25 DIAGNOSIS — R599 Enlarged lymph nodes, unspecified: Secondary | ICD-10-CM

## 2011-08-25 LAB — CBC WITH DIFFERENTIAL/PLATELET
Basophils Absolute: 0.1 10*3/uL (ref 0.0–0.1)
EOS%: 5.9 % (ref 0.0–7.0)
HCT: 41.9 % (ref 38.4–49.9)
HGB: 14.9 g/dL (ref 13.0–17.1)
MCH: 30.8 pg (ref 27.2–33.4)
MCV: 86.6 fL (ref 79.3–98.0)
NEUT%: 59.4 % (ref 39.0–75.0)
Platelets: 241 10*3/uL (ref 140–400)
lymph#: 1.7 10*3/uL (ref 0.9–3.3)

## 2011-08-25 LAB — COMPREHENSIVE METABOLIC PANEL
Albumin: 4.1 g/dL (ref 3.5–5.2)
Alkaline Phosphatase: 60 U/L (ref 39–117)
BUN: 21 mg/dL (ref 6–23)
Calcium: 9.5 mg/dL (ref 8.4–10.5)
Glucose, Bld: 129 mg/dL — ABNORMAL HIGH (ref 70–99)
Potassium: 4.3 mEq/L (ref 3.5–5.3)

## 2011-08-25 MED ORDER — PROCHLORPERAZINE MALEATE 10 MG PO TABS: 10.0000 mg | ORAL_TABLET | Freq: Four times a day (QID) | ORAL | Status: DC | PRN

## 2011-08-25 MED ORDER — SODIUM CHLORIDE 0.9 % IV SOLN
90.0000 mg/m2 | Freq: Once | INTRAVENOUS | Status: AC
  Administered 2011-08-25: 220 mg via INTRAVENOUS
  Filled 2011-08-25: qty 44

## 2011-08-25 MED ORDER — SODIUM CHLORIDE 0.9 % IJ SOLN
10.0000 mL | INTRAMUSCULAR | Status: DC | PRN
  Administered 2011-08-25: 10 mL
  Filled 2011-08-25: qty 10

## 2011-08-25 MED ORDER — SODIUM CHLORIDE 0.9 % IV SOLN
Freq: Once | INTRAVENOUS | Status: AC
  Administered 2011-08-25: 09:00:00 via INTRAVENOUS

## 2011-08-25 MED ORDER — ONDANSETRON 8 MG/50ML IVPB (CHCC)
8.0000 mg | Freq: Once | INTRAVENOUS | Status: AC
  Administered 2011-08-25: 8 mg via INTRAVENOUS

## 2011-08-25 MED ORDER — HEPARIN SOD (PORK) LOCK FLUSH 100 UNIT/ML IV SOLN
500.0000 [IU] | Freq: Once | INTRAVENOUS | Status: AC | PRN
  Administered 2011-08-25: 500 [IU]
  Filled 2011-08-25: qty 5

## 2011-08-25 MED ORDER — ACETAMINOPHEN 325 MG PO TABS
650.0000 mg | ORAL_TABLET | Freq: Once | ORAL | Status: AC
  Administered 2011-08-25: 650 mg via ORAL

## 2011-08-25 MED ORDER — DEXAMETHASONE SODIUM PHOSPHATE 10 MG/ML IJ SOLN
10.0000 mg | Freq: Once | INTRAMUSCULAR | Status: AC
  Administered 2011-08-25: 10 mg via INTRAVENOUS

## 2011-08-25 MED ORDER — SODIUM CHLORIDE 0.9 % IV SOLN
375.0000 mg/m2 | Freq: Once | INTRAVENOUS | Status: AC
  Administered 2011-08-25: 900 mg via INTRAVENOUS
  Filled 2011-08-25: qty 90

## 2011-08-25 MED ORDER — DIPHENHYDRAMINE HCL 25 MG PO CAPS
50.0000 mg | ORAL_CAPSULE | Freq: Once | ORAL | Status: AC
  Administered 2011-08-25: 50 mg via ORAL

## 2011-08-25 NOTE — Patient Instructions (Signed)
[facility] Discharge Instructions for Patients Receiving Chemotherapy  Today you received the following chemotherapy agents Rituxan and Treanda   To help prevent nausea and vomiting after your treatment, we encourage you to take your nausea medication as prescribed Begin taking it at this evening and take it as often as prescribed for the next 2 days   If you develop nausea and vomiting that is not controlled by your nausea medication, call the clinic. If it is after clinic hours your family physician or the after hours number for the clinic or go to the Emergency Department.   BELOW ARE SYMPTOMS THAT SHOULD BE REPORTED IMMEDIATELY:  *FEVER GREATER THAN 100.5 F  *CHILLS WITH OR WITHOUT FEVER  NAUSEA AND VOMITING THAT IS NOT CONTROLLED WITH YOUR NAUSEA MEDICATION  *UNUSUAL SHORTNESS OF BREATH  *UNUSUAL BRUISING OR BLEEDING  TENDERNESS IN MOUTH AND THROAT WITH OR WITHOUT PRESENCE OF ULCERS  *URINARY PROBLEMS  *BOWEL PROBLEMS  UNUSUAL RASH Items with * indicate a potential emergency and should be followed up as soon as possible.  One of the nurses will contact you 24 hours after your treatment. Please let the nurse know about any problems that you may have experienced. Feel free to call the clinic you have any questions or concerns. The clinic phone number is [phone].   I have been informed and understand all the instructions given to me. I know to contact the clinic, my physician, or go to the Emergency Department if any problems should occur. I do not have any questions at this time, but understand that I may call the clinic during office hours   should I have any questions or need assistance in obtaining follow up care.    __________________________________________  _____________  __________ Signature of Patient or Authorized Representative            Date                   Time    __________________________________________ Nurse's Signature

## 2011-08-26 ENCOUNTER — Ambulatory Visit (HOSPITAL_BASED_OUTPATIENT_CLINIC_OR_DEPARTMENT_OTHER): Payer: BC Managed Care – PPO

## 2011-08-26 VITALS — BP 116/66 | HR 73 | Temp 98.2°F

## 2011-08-26 DIAGNOSIS — C8299 Follicular lymphoma, unspecified, extranodal and solid organ sites: Secondary | ICD-10-CM

## 2011-08-26 DIAGNOSIS — C8290 Follicular lymphoma, unspecified, unspecified site: Secondary | ICD-10-CM

## 2011-08-26 DIAGNOSIS — R599 Enlarged lymph nodes, unspecified: Secondary | ICD-10-CM

## 2011-08-26 DIAGNOSIS — Z5111 Encounter for antineoplastic chemotherapy: Secondary | ICD-10-CM

## 2011-08-26 MED ORDER — ONDANSETRON 8 MG/50ML IVPB (CHCC)
8.0000 mg | Freq: Once | INTRAVENOUS | Status: AC
  Administered 2011-08-26: 8 mg via INTRAVENOUS

## 2011-08-26 MED ORDER — SODIUM CHLORIDE 0.9 % IV SOLN
Freq: Once | INTRAVENOUS | Status: AC
  Administered 2011-08-26: 14:00:00 via INTRAVENOUS

## 2011-08-26 MED ORDER — SODIUM CHLORIDE 0.9 % IJ SOLN
10.0000 mL | INTRAMUSCULAR | Status: DC | PRN
  Administered 2011-08-26: 10 mL
  Filled 2011-08-26: qty 10

## 2011-08-26 MED ORDER — SODIUM CHLORIDE 0.9 % IV SOLN
90.0000 mg/m2 | Freq: Once | INTRAVENOUS | Status: AC
  Administered 2011-08-26: 220 mg via INTRAVENOUS
  Filled 2011-08-26: qty 44

## 2011-08-26 MED ORDER — HEPARIN SOD (PORK) LOCK FLUSH 100 UNIT/ML IV SOLN
500.0000 [IU] | Freq: Once | INTRAVENOUS | Status: AC | PRN
  Administered 2011-08-26: 500 [IU]
  Filled 2011-08-26: qty 5

## 2011-08-26 MED ORDER — DEXAMETHASONE SODIUM PHOSPHATE 10 MG/ML IJ SOLN
10.0000 mg | Freq: Once | INTRAMUSCULAR | Status: AC
  Administered 2011-08-26: 10 mg via INTRAVENOUS

## 2011-08-29 ENCOUNTER — Telehealth: Payer: Self-pay

## 2011-08-29 NOTE — Telephone Encounter (Signed)
Called pt re: chemo f/u call, spoke with wife, as pt was at work.  Pt's wife states pt has been doing well, no nausea, vomiting, diarrhea, or mouth sores.  She states pt did say he felt a little "like he was gagging" this morning, but states that he thought it was from eating a lot yesterday evening, and did not feel like it was from his treatment.  Informed her that pt may want to eat smaller more frequent meals and avoid spicy, greasy, or fried foods, that can contribute to nausea.  She states pt is drinking fluids well.  She states pt has drug handouts for reference, and they know to call office if any problems.

## 2011-08-29 NOTE — Telephone Encounter (Signed)
Message copied by Abby Potash on Mon Aug 29, 2011  2:14 PM ------      Message from: [place], [place] P      Created: Fri Aug 26, 2011 11:44 AM      Regarding: chemo f/u       1st rituxan/treanda 3/28 & 3/29       Dr Clelia Croft.

## 2011-09-15 ENCOUNTER — Other Ambulatory Visit: Payer: Self-pay | Admitting: Oncology

## 2011-09-22 ENCOUNTER — Ambulatory Visit (HOSPITAL_BASED_OUTPATIENT_CLINIC_OR_DEPARTMENT_OTHER): Payer: BC Managed Care – PPO | Admitting: Oncology

## 2011-09-22 ENCOUNTER — Telehealth: Payer: Self-pay | Admitting: Oncology

## 2011-09-22 ENCOUNTER — Ambulatory Visit (HOSPITAL_BASED_OUTPATIENT_CLINIC_OR_DEPARTMENT_OTHER): Payer: BC Managed Care – PPO

## 2011-09-22 ENCOUNTER — Other Ambulatory Visit (HOSPITAL_BASED_OUTPATIENT_CLINIC_OR_DEPARTMENT_OTHER): Payer: BC Managed Care – PPO | Admitting: Lab

## 2011-09-22 VITALS — BP 158/94 | HR 81 | Temp 97.3°F | Wt 262.6 lb

## 2011-09-22 VITALS — BP 115/76 | HR 69 | Temp 97.1°F

## 2011-09-22 DIAGNOSIS — Z5111 Encounter for antineoplastic chemotherapy: Secondary | ICD-10-CM

## 2011-09-22 DIAGNOSIS — C8299 Follicular lymphoma, unspecified, extranodal and solid organ sites: Secondary | ICD-10-CM

## 2011-09-22 DIAGNOSIS — C8290 Follicular lymphoma, unspecified, unspecified site: Secondary | ICD-10-CM

## 2011-09-22 DIAGNOSIS — L03319 Cellulitis of trunk, unspecified: Secondary | ICD-10-CM

## 2011-09-22 DIAGNOSIS — L02219 Cutaneous abscess of trunk, unspecified: Secondary | ICD-10-CM

## 2011-09-22 DIAGNOSIS — I4891 Unspecified atrial fibrillation: Secondary | ICD-10-CM

## 2011-09-22 DIAGNOSIS — R599 Enlarged lymph nodes, unspecified: Secondary | ICD-10-CM

## 2011-09-22 LAB — CBC WITH DIFFERENTIAL/PLATELET
BASO%: 1 % (ref 0.0–2.0)
Basophils Absolute: 0.1 10*3/uL (ref 0.0–0.1)
EOS%: 6 % (ref 0.0–7.0)
HCT: 40.6 % (ref 38.4–49.9)
HGB: 14.9 g/dL (ref 13.0–17.1)
LYMPH%: 37.2 % (ref 14.0–49.0)
MCH: 31.2 pg (ref 27.2–33.4)
MCHC: 36.7 g/dL — ABNORMAL HIGH (ref 32.0–36.0)
MONO#: 1.3 10*3/uL — ABNORMAL HIGH (ref 0.1–0.9)
NEUT%: 43.1 % (ref 39.0–75.0)
Platelets: 144 10*3/uL (ref 140–400)
lymph#: 3.7 10*3/uL — ABNORMAL HIGH (ref 0.9–3.3)

## 2011-09-22 LAB — COMPREHENSIVE METABOLIC PANEL
Albumin: 4.2 g/dL (ref 3.5–5.2)
BUN: 23 mg/dL (ref 6–23)
Calcium: 9 mg/dL (ref 8.4–10.5)
Chloride: 104 mEq/L (ref 96–112)
Creatinine, Ser: 1 mg/dL (ref 0.50–1.35)
Glucose, Bld: 120 mg/dL — ABNORMAL HIGH (ref 70–99)
Potassium: 3.9 mEq/L (ref 3.5–5.3)

## 2011-09-22 MED ORDER — SODIUM CHLORIDE 0.9 % IV SOLN
375.0000 mg/m2 | Freq: Once | INTRAVENOUS | Status: AC
  Administered 2011-09-22: 900 mg via INTRAVENOUS
  Filled 2011-09-22: qty 90

## 2011-09-22 MED ORDER — DEXAMETHASONE SODIUM PHOSPHATE 10 MG/ML IJ SOLN
10.0000 mg | Freq: Once | INTRAMUSCULAR | Status: AC
  Administered 2011-09-22: 10 mg via INTRAVENOUS

## 2011-09-22 MED ORDER — SODIUM CHLORIDE 0.9 % IV SOLN
90.0000 mg/m2 | Freq: Once | INTRAVENOUS | Status: AC
  Administered 2011-09-22: 220 mg via INTRAVENOUS
  Filled 2011-09-22: qty 44

## 2011-09-22 MED ORDER — ONDANSETRON 8 MG/50ML IVPB (CHCC)
8.0000 mg | Freq: Once | INTRAVENOUS | Status: AC
  Administered 2011-09-22: 8 mg via INTRAVENOUS

## 2011-09-22 MED ORDER — DIPHENHYDRAMINE HCL 25 MG PO CAPS
50.0000 mg | ORAL_CAPSULE | Freq: Once | ORAL | Status: AC
  Administered 2011-09-22: 50 mg via ORAL

## 2011-09-22 MED ORDER — SODIUM CHLORIDE 0.9 % IV SOLN
Freq: Once | INTRAVENOUS | Status: AC
  Administered 2011-09-22: 10:00:00 via INTRAVENOUS

## 2011-09-22 MED ORDER — HEPARIN SOD (PORK) LOCK FLUSH 100 UNIT/ML IV SOLN
500.0000 [IU] | Freq: Once | INTRAVENOUS | Status: AC | PRN
  Administered 2011-09-22: 500 [IU]
  Filled 2011-09-22: qty 5

## 2011-09-22 MED ORDER — SODIUM CHLORIDE 0.9 % IJ SOLN
10.0000 mL | INTRAMUSCULAR | Status: DC | PRN
  Administered 2011-09-22: 10 mL
  Filled 2011-09-22: qty 10

## 2011-09-22 MED ORDER — CEPHALEXIN 500 MG PO CAPS: 500.0000 mg | ORAL_CAPSULE | Freq: Four times a day (QID) | ORAL | Status: AC

## 2011-09-22 MED ORDER — ACETAMINOPHEN 325 MG PO TABS
650.0000 mg | ORAL_TABLET | Freq: Once | ORAL | Status: AC
  Administered 2011-09-22: 650 mg via ORAL

## 2011-09-22 NOTE — Progress Notes (Signed)
Hematology and Oncology Follow Up Visit  KOLTON KIENLE [id number] Jan 04, 1951 61 y.o. 09/22/2011 9:57 AM    CC: [name], [name], [name], [name]: A 61 year old gentleman with stage IIIA low-grade follicular lymphoma diagnosed May 2010.   Current therapy:  S/P the first cycle of  bendamustine and rituximab cycle one given on 08/25/2011. He is here for cycle 2.   Interim History:  Mr. Gerdeman presents today for a followup visit.  This is a pleasant gentleman with follicular lymphoma.  He has disease above and below the diaphragm.  He has cervical adenopathy as well as intra-abdominal adenopathy.  Had been asymptomatic from his disease and has deferred treatments until he last months. He tolerated chemotherapy without complications.  He had reported bulky adenopathy in the neck has decreased dramatically.  Did not report any dysphagia.  Did not report any odynophagia.  Did not report any fevers.  Did not report any abdominal distension or early satiety.  He is actually working full-time and really no deterioration in his health.  No evidence of any end-organ damage. He did report what appears to be an insect bite on his abdomin. No symptoms otherwise.     Medications: I have reviewed the patient's current medications. Current outpatient prescriptions:aspirin 325 MG EC tablet, Take 325 mg by mouth daily.  , Disp: , Rfl: ;  celecoxib (CELEBREX) 200 MG capsule, Take 200 mg by mouth daily.  , Disp: , Rfl: ;  esomeprazole (NEXIUM) 40 MG capsule, Take 1 capsule (40 mg total) by mouth daily. Office visit due now, Disp: 90 capsule, Rfl: 3;  fish oil-omega-3 fatty acids 1000 MG capsule, Take 2 g by mouth 2 (two) times daily.  , Disp: , Rfl:  lidocaine-prilocaine (EMLA) cream, Apply topically as needed., Disp: 30 g, Rfl: 2;  metoprolol (TOPROL-XL) 50 MG 24 hr tablet, Take 1 tablet (50 mg total) by mouth daily., Disp: 90 tablet, Rfl: 3;   valsartan-hydrochlorothiazide (DIOVAN HCT) 320-12.5 MG per tablet, 1 po qd, Disp: 90 tablet, Rfl: 3;  cephALEXin (KEFLEX) 500 MG capsule, Take 1 capsule (500 mg total) by mouth 4 (four) times daily., Disp: 28 capsule, Rfl: 0 prochlorperazine (COMPAZINE) 10 MG tablet, Take 1 tablet (10 mg total) by mouth every 6 (six) hours as needed., Disp: 30 tablet, Rfl: 1  Allergies:  Allergies  Allergen Reactions  . Ampicillin     Past Medical History, Surgical history, Social history, and Family History were reviewed and updated.  Review of Systems: Constitutional:  Negative for fever, chills, night sweats, anorexia, weight loss, pain. Cardiovascular: no chest pain or dyspnea on exertion Respiratory: no cough, shortness of breath, or wheezing Neurological: no TIA or stroke symptoms Dermatological: negative ENT: negative Skin: Negative. Gastrointestinal: no abdominal pain, change in bowel habits, or black or bloody stools Genito-Urinary: no dysuria, trouble voiding, or hematuria Hematological and Lymphatic: negative Breast: negative Musculoskeletal: negative Remaining ROS negative. Physical Exam: Blood pressure 158/94, pulse 81, temperature 97.3 F (36.3 C), temperature source Oral, weight 262 lb 9 oz (119.098 kg). ECOG: 0 General appearance: alert Head: Normocephalic, without obvious abnormality, atraumatic Neck: no adenopathy, no carotid bruit, no JVD, supple, symmetrical, trachea midline and thyroid not enlarged, symmetric, no tenderness/mass/nodules Lymph nodes: Cervical adenopathy decreased dramatically. Size range between 0.5 to 1 cm now Heart:regular rate and rhythm, S1, S2 normal, no murmur, click, rub or gallop Lung:chest clear, no wheezing, rales, normal symmetric air entry,  Abdomin:  soft, non-tender, without masses or organomegaly Skin: noted erythema and induration in the paraumbilical area.  EXT:no erythema, induration, or nodules   Lab Results: Lab Results  Component Value  Date   WBC 10.0 09/22/2011   HGB 14.9 09/22/2011   HCT 40.6 09/22/2011   MCV 85.1 09/22/2011   PLT 144 09/22/2011     Chemistry      Component Value Date/Time   NA 139 08/25/2011 0800   NA 140 04/28/2011 0921   K 4.3 08/25/2011 0800   K 4.4 04/28/2011 0921   CL 103 08/25/2011 0800   CL 101 04/28/2011 0921   CO2 29 08/25/2011 0800   CO2 31 04/28/2011 0921   BUN 21 08/25/2011 0800   BUN 18 04/28/2011 0921   CREATININE 0.95 08/25/2011 0800   CREATININE 1.1 04/28/2011 0921      Component Value Date/Time   CALCIUM 9.5 08/25/2011 0800   CALCIUM 8.5 04/28/2011 0921   ALKPHOS 60 08/25/2011 0800   ALKPHOS 71 04/28/2011 0921   AST 29 08/25/2011 0800   AST 25 04/28/2011 0921   ALT 27 08/25/2011 0800   BILITOT 0.5 08/25/2011 0800   BILITOT 0.70 04/28/2011 0921         Impression and Plan: This a pleasant 61 year old gentleman with the following issues: 1. Stage III follicular lymphoma.  He is now developing more bulky adenopathy as well as bulky neck adenopathy.  He is S/P bendamustine and rituximab cycle one without any major complication. We are also noting clinical benefits as well The plan is to proceed with cycle 2 today and cycle 3 in 4 weeks. Repeat CT scan will be done after cycle 3.  2. Atrial fibrillation.  He is normal sinus rhythm at this point, followed by cardiologist.    3.     Abdominal cellulitis: I have given Rx for Keflex for that.     [facility], MD 4/25/20139:57 AM

## 2011-09-22 NOTE — Telephone Encounter (Signed)
appts made and printed for pt,pt aware that tx time will drop down and email was sent to mw to fix  aom

## 2011-09-23 ENCOUNTER — Telehealth: Payer: Self-pay | Admitting: *Deleted

## 2011-09-23 ENCOUNTER — Ambulatory Visit (HOSPITAL_BASED_OUTPATIENT_CLINIC_OR_DEPARTMENT_OTHER): Payer: BC Managed Care – PPO

## 2011-09-23 VITALS — BP 148/86 | HR 93 | Temp 98.2°F

## 2011-09-23 DIAGNOSIS — Z5111 Encounter for antineoplastic chemotherapy: Secondary | ICD-10-CM

## 2011-09-23 DIAGNOSIS — C8290 Follicular lymphoma, unspecified, unspecified site: Secondary | ICD-10-CM

## 2011-09-23 DIAGNOSIS — C8299 Follicular lymphoma, unspecified, extranodal and solid organ sites: Secondary | ICD-10-CM

## 2011-09-23 DIAGNOSIS — R599 Enlarged lymph nodes, unspecified: Secondary | ICD-10-CM

## 2011-09-23 MED ORDER — ONDANSETRON 8 MG/50ML IVPB (CHCC)
8.0000 mg | Freq: Once | INTRAVENOUS | Status: AC
  Administered 2011-09-23: 8 mg via INTRAVENOUS

## 2011-09-23 MED ORDER — SODIUM CHLORIDE 0.9 % IV SOLN
90.0000 mg/m2 | Freq: Once | INTRAVENOUS | Status: AC
  Administered 2011-09-23: 220 mg via INTRAVENOUS
  Filled 2011-09-23: qty 44

## 2011-09-23 MED ORDER — DEXAMETHASONE SODIUM PHOSPHATE 10 MG/ML IJ SOLN
10.0000 mg | Freq: Once | INTRAMUSCULAR | Status: AC
  Administered 2011-09-23: 10 mg via INTRAVENOUS

## 2011-09-23 MED ORDER — HEPARIN SOD (PORK) LOCK FLUSH 100 UNIT/ML IV SOLN
500.0000 [IU] | Freq: Once | INTRAVENOUS | Status: AC | PRN
  Administered 2011-09-23: 500 [IU]
  Filled 2011-09-23: qty 5

## 2011-09-23 MED ORDER — SODIUM CHLORIDE 0.9 % IV SOLN
Freq: Once | INTRAVENOUS | Status: AC
  Administered 2011-09-23: 14:00:00 via INTRAVENOUS

## 2011-09-23 MED ORDER — SODIUM CHLORIDE 0.9 % IJ SOLN
10.0000 mL | INTRAMUSCULAR | Status: DC | PRN
  Administered 2011-09-23: 10 mL
  Filled 2011-09-23: qty 10

## 2011-09-23 NOTE — Patient Instructions (Signed)
[facility] Discharge Instructions for Patients Receiving Chemotherapy  Today you received the following chemotherapy agents Treanda  To help prevent nausea and vomiting after your treatment, we encourage you to take your nausea medication Begin taking it at 7 pm and take it as often as prescribed for the next 24 to 72 hours.   If you develop nausea and vomiting that is not controlled by your nausea medication, call the clinic. If it is after clinic hours your family physician or the after hours number for the clinic or go to the Emergency Department.   BELOW ARE SYMPTOMS THAT SHOULD BE REPORTED IMMEDIATELY:  *FEVER GREATER THAN 100.5 F  *CHILLS WITH OR WITHOUT FEVER  NAUSEA AND VOMITING THAT IS NOT CONTROLLED WITH YOUR NAUSEA MEDICATION  *UNUSUAL SHORTNESS OF BREATH  *UNUSUAL BRUISING OR BLEEDING  TENDERNESS IN MOUTH AND THROAT WITH OR WITHOUT PRESENCE OF ULCERS  *URINARY PROBLEMS  *BOWEL PROBLEMS  UNUSUAL RASH Items with * indicate a potential emergency and should be followed up as soon as possible.  One of the nurses will contact you 24 hours after your treatment. Please let the nurse know about any problems that you may have experienced. Feel free to call the clinic you have any questions or concerns. The clinic phone number is [phone].   I have been informed and understand all the instructions given to me. I know to contact the clinic, my physician, or go to the Emergency Department if any problems should occur. I do not have any questions at this time, but understand that I may call the clinic during office hours   should I have any questions or need assistance in obtaining follow up care.    __________________________________________  _____________  __________ Signature of Patient or Authorized Representative            Date                   Time    __________________________________________ Nurse's Signature    

## 2011-09-23 NOTE — Telephone Encounter (Signed)
Per staff message from Thurston Hole I have moved the patient's treatment. Anne aware.  JMW

## 2011-10-20 ENCOUNTER — Telehealth: Payer: Self-pay | Admitting: Oncology

## 2011-10-20 ENCOUNTER — Ambulatory Visit (HOSPITAL_BASED_OUTPATIENT_CLINIC_OR_DEPARTMENT_OTHER): Payer: BC Managed Care – PPO | Admitting: Oncology

## 2011-10-20 ENCOUNTER — Encounter: Payer: Self-pay | Admitting: Oncology

## 2011-10-20 ENCOUNTER — Ambulatory Visit (HOSPITAL_BASED_OUTPATIENT_CLINIC_OR_DEPARTMENT_OTHER): Payer: BC Managed Care – PPO

## 2011-10-20 ENCOUNTER — Other Ambulatory Visit (HOSPITAL_BASED_OUTPATIENT_CLINIC_OR_DEPARTMENT_OTHER): Payer: BC Managed Care – PPO | Admitting: Lab

## 2011-10-20 VITALS — BP 107/79 | HR 65 | Temp 98.6°F

## 2011-10-20 VITALS — BP 129/79 | HR 92 | Temp 98.6°F | Ht 71.0 in | Wt 258.3 lb

## 2011-10-20 DIAGNOSIS — C8290 Follicular lymphoma, unspecified, unspecified site: Secondary | ICD-10-CM

## 2011-10-20 DIAGNOSIS — I4891 Unspecified atrial fibrillation: Secondary | ICD-10-CM

## 2011-10-20 DIAGNOSIS — R599 Enlarged lymph nodes, unspecified: Secondary | ICD-10-CM

## 2011-10-20 DIAGNOSIS — Z5111 Encounter for antineoplastic chemotherapy: Secondary | ICD-10-CM

## 2011-10-20 DIAGNOSIS — C8299 Follicular lymphoma, unspecified, extranodal and solid organ sites: Secondary | ICD-10-CM

## 2011-10-20 LAB — CBC WITH DIFFERENTIAL/PLATELET
BASO%: 1.3 % (ref 0.0–2.0)
HCT: 37.6 % — ABNORMAL LOW (ref 38.4–49.9)
LYMPH%: 20 % (ref 14.0–49.0)
MCH: 31.4 pg (ref 27.2–33.4)
MCHC: 36.7 g/dL — ABNORMAL HIGH (ref 32.0–36.0)
MCV: 85.5 fL (ref 79.3–98.0)
MONO#: 1.1 10*3/uL — ABNORMAL HIGH (ref 0.1–0.9)
NEUT%: 56.1 % (ref 39.0–75.0)
Platelets: 234 10*3/uL (ref 140–400)

## 2011-10-20 LAB — COMPREHENSIVE METABOLIC PANEL
AST: 14 U/L (ref 0–37)
BUN: 20 mg/dL (ref 6–23)
Calcium: 9.3 mg/dL (ref 8.4–10.5)
Chloride: 105 mEq/L (ref 96–112)
Creatinine, Ser: 0.94 mg/dL (ref 0.50–1.35)

## 2011-10-20 LAB — TECHNOLOGIST REVIEW

## 2011-10-20 MED ORDER — DIPHENHYDRAMINE HCL 25 MG PO CAPS
50.0000 mg | ORAL_CAPSULE | Freq: Once | ORAL | Status: AC
  Administered 2011-10-20: 50 mg via ORAL

## 2011-10-20 MED ORDER — SODIUM CHLORIDE 0.9 % IV SOLN
Freq: Once | INTRAVENOUS | Status: AC
  Administered 2011-10-20: 09:00:00 via INTRAVENOUS

## 2011-10-20 MED ORDER — SODIUM CHLORIDE 0.9 % IV SOLN
375.0000 mg/m2 | Freq: Once | INTRAVENOUS | Status: AC
  Administered 2011-10-20: 900 mg via INTRAVENOUS
  Filled 2011-10-20: qty 90

## 2011-10-20 MED ORDER — ONDANSETRON 8 MG/50ML IVPB (CHCC)
8.0000 mg | Freq: Once | INTRAVENOUS | Status: AC
  Administered 2011-10-20: 8 mg via INTRAVENOUS

## 2011-10-20 MED ORDER — HEPARIN SOD (PORK) LOCK FLUSH 100 UNIT/ML IV SOLN
500.0000 [IU] | Freq: Once | INTRAVENOUS | Status: AC | PRN
  Administered 2011-10-20: 500 [IU]
  Filled 2011-10-20: qty 5

## 2011-10-20 MED ORDER — DEXAMETHASONE SODIUM PHOSPHATE 10 MG/ML IJ SOLN
10.0000 mg | Freq: Once | INTRAMUSCULAR | Status: AC
  Administered 2011-10-20: 10 mg via INTRAVENOUS

## 2011-10-20 MED ORDER — SODIUM CHLORIDE 0.9 % IJ SOLN
10.0000 mL | INTRAMUSCULAR | Status: DC | PRN
  Administered 2011-10-20: 10 mL
  Filled 2011-10-20: qty 10

## 2011-10-20 MED ORDER — ACETAMINOPHEN 325 MG PO TABS
650.0000 mg | ORAL_TABLET | Freq: Once | ORAL | Status: AC
  Administered 2011-10-20: 650 mg via ORAL

## 2011-10-20 MED ORDER — SODIUM CHLORIDE 0.9 % IV SOLN
90.0000 mg/m2 | Freq: Once | INTRAVENOUS | Status: AC
  Administered 2011-10-20: 220 mg via INTRAVENOUS
  Filled 2011-10-20: qty 44

## 2011-10-20 NOTE — Telephone Encounter (Signed)
[facility] pt appt for june2013.  scheduled ct scan for 06/17 @ [facility]

## 2011-10-20 NOTE — Patient Instructions (Signed)
[facility] Discharge Instructions for Patients Receiving Chemotherapy  Today you received the following chemotherapy agents Rituxan/Treanda To help prevent nausea and vomiting after your treatment, we encourage you to take your nausea medication as prescribed.  If you develop nausea and vomiting that is not controlled by your nausea medication, call the clinic. If it is after clinic hours your family physician or the after hours number for the clinic or go to the Emergency Department.   BELOW ARE SYMPTOMS THAT SHOULD BE REPORTED IMMEDIATELY:  *FEVER GREATER THAN 100.5 F  *CHILLS WITH OR WITHOUT FEVER  NAUSEA AND VOMITING THAT IS NOT CONTROLLED WITH YOUR NAUSEA MEDICATION  *UNUSUAL SHORTNESS OF BREATH  *UNUSUAL BRUISING OR BLEEDING  TENDERNESS IN MOUTH AND THROAT WITH OR WITHOUT PRESENCE OF ULCERS  *URINARY PROBLEMS  *BOWEL PROBLEMS  UNUSUAL RASH Items with * indicate a potential emergency and should be followed up as soon as possible.  One of the nurses will contact you 24 hours after your treatment. Please let the nurse know about any problems that you may have experienced. Feel free to call the clinic you have any questions or concerns. The clinic phone number is [phone].   I have been informed and understand all the instructions given to me. I know to contact the clinic, my physician, or go to the Emergency Department if any problems should occur. I do not have any questions at this time, but understand that I may call the clinic during office hours   should I have any questions or need assistance in obtaining follow up care.    __________________________________________  _____________  __________ Signature of Patient or Authorized Representative            Date                   Time    __________________________________________ Nurse's Signature    

## 2011-10-20 NOTE — Progress Notes (Signed)
Hematology and Oncology Follow Up Visit  Tom Fuller [id number] 08/27/1950 61 y.o. 10/20/2011 9:26 AM    CC: Tom Fuller. Tom Ridgel, MD  Tom Fuller, [name]  Tom Perla, [name]: A 61 year old gentleman with stage IIIA low-grade follicular lymphoma diagnosed May 2010.  Current therapy:  Bendamustine and rituximab started on 08/25/11. He is here for cycle 3 today.  Interim History:  [name] presents today for a followup visit.  This is a pleasant gentleman with follicular lymphoma.  He has disease above and below the diaphragm.  He has cervical adenopathy as well as intra-abdominal adenopathy.  He had bulky adenopathy in the neck that has decreased dramatically since starting chemotherapy. Denies fever, chest pain, shortness of breath. He had dry heaves 1-2 days after the last cycle of chemotherapy, but no vomiting. Not taking anti-emetics. Fatigue increased from baseline, but patient still able to work and fatigue has not really affected performance status. Appetite remains good.  Cellulitis to right abdomen improved with Keflex; area now scabbed over without drainage.  Medications: I have reviewed the patient's current medications. Current outpatient prescriptions:aspirin 325 MG EC tablet, Take 325 mg by mouth daily.  , Disp: , Rfl: ;  celecoxib (CELEBREX) 200 MG capsule, Take 200 mg by mouth daily.  , Disp: , Rfl: ;  esomeprazole (NEXIUM) 40 MG capsule, Take 1 capsule (40 mg total) by mouth daily. Office visit due now, Disp: 90 capsule, Rfl: 3;  fish oil-omega-3 fatty acids 1000 MG capsule, Take 2 g by mouth 2 (two) times daily.  , Disp: , Rfl:  lidocaine-prilocaine (EMLA) cream, Apply topically as needed., Disp: 30 g, Rfl: 2;  metoprolol (TOPROL-XL) 50 MG 24 hr tablet, Take 1 tablet (50 mg total) by mouth daily., Disp: 90 tablet, Rfl: 3;  prochlorperazine (COMPAZINE) 10 MG tablet, Take 1 tablet (10 mg total) by mouth every 6 (six) hours as needed., Disp: 30 tablet, Rfl: 1;   valsartan-hydrochlorothiazide (DIOVAN HCT) 320-12.5 MG per tablet, 1 po qd, Disp: 90 tablet, Rfl: 3  Allergies:  Allergies  Allergen Reactions  . Ampicillin     Past Medical History, Surgical history, Social history, and Family History were reviewed and updated.  Review of Systems: Constitutional:  Negative for fever, chills, night sweats, anorexia, weight loss, pain. Cardiovascular: no chest pain or dyspnea on exertion Respiratory: no cough, shortness of breath, or wheezing Neurological: no TIA or stroke symptoms Dermatological: negative ENT: negative Skin: Negative. Gastrointestinal: no abdominal pain, change in bowel habits, or black or bloody stools Genito-Urinary: no dysuria, trouble voiding, or hematuria Hematological and Lymphatic: negative Breast: negative Musculoskeletal: negative Remaining ROS negative.  Physical Exam: Blood pressure 129/79, pulse 92, temperature 98.6 F (37 C), temperature source Oral, height 5\' 11"  (1.803 m), weight 258 lb 4.8 oz (117.164 kg). ECOG: 0 General appearance: alert Head: Normocephalic, without obvious abnormality, atraumatic Neck: no adenopathy, no carotid bruit, no JVD, supple, symmetrical, trachea midline and thyroid not enlarged, symmetric, no tenderness/mass/nodules Lymph nodes: Cervical adenopathy decreased dramatically. Size range between 0.5 to 1 cm now Heart:regular rate and rhythm, S1, S2 normal, no murmur, click, rub or gallop Lung:chest clear, no wheezing, rales, normal symmetric air entry,  Abdomen: soft, non-tender, without masses or organomegaly Skin: noted erythema and induration in the paraumbilical area.  EXT:no erythema, induration, or nodules   Lab Results: Lab Results  Component Value Date   WBC 6.8 10/20/2011   HGB 13.8 10/20/2011   HCT 37.6* 10/20/2011   MCV  85.5 10/20/2011   PLT 234 10/20/2011     Chemistry      Component Value Date/Time   NA 139 09/22/2011 0858   NA 140 04/28/2011 0921   K 3.9 09/22/2011  0858   K 4.4 04/28/2011 0921   CL 104 09/22/2011 0858   CL 101 04/28/2011 0921   CO2 28 09/22/2011 0858   CO2 31 04/28/2011 0921   BUN 23 09/22/2011 0858   BUN 18 04/28/2011 0921   CREATININE 1.00 09/22/2011 0858   CREATININE 1.1 04/28/2011 0921      Component Value Date/Time   CALCIUM 9.0 09/22/2011 0858   CALCIUM 8.5 04/28/2011 0921   ALKPHOS 57 09/22/2011 0858   ALKPHOS 71 04/28/2011 0921   AST 22 09/22/2011 0858   AST 25 04/28/2011 0921   ALT 21 09/22/2011 0858   BILITOT 0.6 09/22/2011 0858   BILITOT 0.70 04/28/2011 0921      Impression and Plan: This a pleasant 61 year old gentleman with the following issues: 1. Stage III follicular lymphoma.  He developed bulky adenopathy as well as bulky neck adenopathy that has now improved with chemotherapy. He is tolerating chemotherapy well overall without any major toxicities. Recommend that he proceed with cycle 3 today without dose modification. Repeat CT scan will be done after cycle 3.  2. Atrial fibrillation.  He is normal sinus rhythm at this point, followed by cardiologist.   3. Nausea/vomiting prophylaxis. Has compazine and I have reviewed how he should take this. 4. Follow-up. In 4 weeks after CT scan for cycle 4 of chemotherapy.   [place], Belenda Cruise 5/23/20139:26 AM

## 2011-10-21 ENCOUNTER — Ambulatory Visit (HOSPITAL_BASED_OUTPATIENT_CLINIC_OR_DEPARTMENT_OTHER): Payer: BC Managed Care – PPO

## 2011-10-21 VITALS — BP 117/78 | HR 94 | Temp 98.4°F

## 2011-10-21 DIAGNOSIS — C8299 Follicular lymphoma, unspecified, extranodal and solid organ sites: Secondary | ICD-10-CM

## 2011-10-21 DIAGNOSIS — R599 Enlarged lymph nodes, unspecified: Secondary | ICD-10-CM

## 2011-10-21 DIAGNOSIS — Z5111 Encounter for antineoplastic chemotherapy: Secondary | ICD-10-CM

## 2011-10-21 DIAGNOSIS — C8290 Follicular lymphoma, unspecified, unspecified site: Secondary | ICD-10-CM

## 2011-10-21 MED ORDER — SODIUM CHLORIDE 0.9 % IJ SOLN
10.0000 mL | INTRAMUSCULAR | Status: DC | PRN
  Administered 2011-10-21: 10 mL
  Filled 2011-10-21: qty 10

## 2011-10-21 MED ORDER — SODIUM CHLORIDE 0.9 % IV SOLN
Freq: Once | INTRAVENOUS | Status: AC
  Administered 2011-10-21: 14:00:00 via INTRAVENOUS

## 2011-10-21 MED ORDER — SODIUM CHLORIDE 0.9 % IV SOLN
90.0000 mg/m2 | Freq: Once | INTRAVENOUS | Status: AC
  Administered 2011-10-21: 220 mg via INTRAVENOUS
  Filled 2011-10-21: qty 44

## 2011-10-21 MED ORDER — ONDANSETRON 8 MG/50ML IVPB (CHCC)
8.0000 mg | Freq: Once | INTRAVENOUS | Status: AC
  Administered 2011-10-21: 8 mg via INTRAVENOUS

## 2011-10-21 MED ORDER — HEPARIN SOD (PORK) LOCK FLUSH 100 UNIT/ML IV SOLN
500.0000 [IU] | Freq: Once | INTRAVENOUS | Status: AC | PRN
  Administered 2011-10-21: 500 [IU]
  Filled 2011-10-21: qty 5

## 2011-10-21 MED ORDER — DEXAMETHASONE SODIUM PHOSPHATE 10 MG/ML IJ SOLN
10.0000 mg | Freq: Once | INTRAMUSCULAR | Status: AC
  Administered 2011-10-21: 10 mg via INTRAVENOUS

## 2011-10-21 NOTE — Patient Instructions (Signed)
[facility] Discharge Instructions for Patients Receiving Chemotherapy  Today you received the following chemotherapy agent Treanda  To help prevent nausea and vomiting after your treatment, we encourage you to take your nausea medication. Begin taking it as often as prescribed for by Dr. Clelia Croft.    If you develop nausea and vomiting that is not controlled by your nausea medication, call the clinic. If it is after clinic hours your family physician or the after hours number for the clinic or go to the Emergency Department.   BELOW ARE SYMPTOMS THAT SHOULD BE REPORTED IMMEDIATELY:  *FEVER GREATER THAN 100.5 F  *CHILLS WITH OR WITHOUT FEVER  NAUSEA AND VOMITING THAT IS NOT CONTROLLED WITH YOUR NAUSEA MEDICATION  *UNUSUAL SHORTNESS OF BREATH  *UNUSUAL BRUISING OR BLEEDING  TENDERNESS IN MOUTH AND THROAT WITH OR WITHOUT PRESENCE OF ULCERS  *URINARY PROBLEMS  *BOWEL PROBLEMS  UNUSUAL RASH Items with * indicate a potential emergency and should be followed up as soon as possible.  One of the nurses will contact you 24 hours after your treatment. Please let the nurse know about any problems that you may have experienced. Feel free to call the clinic you have any questions or concerns. The clinic phone number is [phone].   I have been informed and understand all the instructions given to me. I know to contact the clinic, my physician, or go to the Emergency Department if any problems should occur. I do not have any questions at this time, but understand that I may call the clinic during office hours   should I have any questions or need assistance in obtaining follow up care.    __________________________________________  _____________  __________ Signature of Patient or Authorized Representative            Date                   Time    __________________________________________ Nurse's Signature

## 2011-11-03 ENCOUNTER — Telehealth: Payer: Self-pay | Admitting: *Deleted

## 2011-11-03 NOTE — Telephone Encounter (Signed)
Patient's wife called stating patient c/o dry cough, persisting about a week, presence of sores in back of throat.  Family in household has been sick with similar symptoms, since resolved.  Patient is unable to assess temperature d/t at work currently.  Per Dr. Clelia Croft, OTC cough medicine sufficient, no antibiotics indicated at this time d/t likely viral infection.  Patient to assess temperature, and to call if >100.5.

## 2011-11-09 ENCOUNTER — Other Ambulatory Visit: Payer: Self-pay | Admitting: Oncology

## 2011-11-14 ENCOUNTER — Ambulatory Visit (HOSPITAL_COMMUNITY)
Admission: RE | Admit: 2011-11-14 | Discharge: 2011-11-14 | Disposition: A | Discharge status: Home / Self Care | Payer: BC Managed Care – PPO | Source: Ambulatory Visit | Attending: Oncology | Admitting: Oncology

## 2011-11-14 DIAGNOSIS — J984 Other disorders of lung: Secondary | ICD-10-CM | POA: Insufficient documentation

## 2011-11-14 DIAGNOSIS — N281 Cyst of kidney, acquired: Secondary | ICD-10-CM | POA: Insufficient documentation

## 2011-11-14 DIAGNOSIS — C8589 Other specified types of non-Hodgkin lymphoma, extranodal and solid organ sites: Secondary | ICD-10-CM | POA: Insufficient documentation

## 2011-11-14 DIAGNOSIS — C8290 Follicular lymphoma, unspecified, unspecified site: Secondary | ICD-10-CM

## 2011-11-14 DIAGNOSIS — R599 Enlarged lymph nodes, unspecified: Secondary | ICD-10-CM | POA: Insufficient documentation

## 2011-11-14 DIAGNOSIS — Z9221 Personal history of antineoplastic chemotherapy: Secondary | ICD-10-CM | POA: Insufficient documentation

## 2011-11-14 DIAGNOSIS — R911 Solitary pulmonary nodule: Secondary | ICD-10-CM | POA: Insufficient documentation

## 2011-11-14 MED ORDER — IOHEXOL 300 MG/ML  SOLN
100.0000 mL | Freq: Once | INTRAMUSCULAR | Status: AC | PRN
  Administered 2011-11-14: 125 mL via INTRAVENOUS

## 2011-11-17 ENCOUNTER — Encounter: Payer: Self-pay | Admitting: Oncology

## 2011-11-17 ENCOUNTER — Other Ambulatory Visit (HOSPITAL_BASED_OUTPATIENT_CLINIC_OR_DEPARTMENT_OTHER): Payer: BC Managed Care – PPO | Admitting: Lab

## 2011-11-17 ENCOUNTER — Telehealth: Payer: Self-pay | Admitting: *Deleted

## 2011-11-17 ENCOUNTER — Ambulatory Visit (HOSPITAL_BASED_OUTPATIENT_CLINIC_OR_DEPARTMENT_OTHER): Payer: BC Managed Care – PPO | Admitting: Oncology

## 2011-11-17 ENCOUNTER — Ambulatory Visit (HOSPITAL_BASED_OUTPATIENT_CLINIC_OR_DEPARTMENT_OTHER): Payer: BC Managed Care – PPO

## 2011-11-17 VITALS — BP 134/85 | HR 56 | Temp 97.9°F | Ht 71.0 in | Wt 263.1 lb

## 2011-11-17 VITALS — BP 124/77 | HR 88 | Temp 98.0°F

## 2011-11-17 DIAGNOSIS — C8290 Follicular lymphoma, unspecified, unspecified site: Secondary | ICD-10-CM

## 2011-11-17 DIAGNOSIS — C8299 Follicular lymphoma, unspecified, extranodal and solid organ sites: Secondary | ICD-10-CM

## 2011-11-17 DIAGNOSIS — I4891 Unspecified atrial fibrillation: Secondary | ICD-10-CM

## 2011-11-17 DIAGNOSIS — R599 Enlarged lymph nodes, unspecified: Secondary | ICD-10-CM

## 2011-11-17 DIAGNOSIS — Z5111 Encounter for antineoplastic chemotherapy: Secondary | ICD-10-CM

## 2011-11-17 LAB — CBC WITH DIFFERENTIAL/PLATELET
Eosinophils Absolute: 0.4 10*3/uL (ref 0.0–0.5)
MONO#: 0.6 10*3/uL (ref 0.1–0.9)
NEUT#: 3.9 10*3/uL (ref 1.5–6.5)
Platelets: 155 10*3/uL (ref 140–400)
RBC: 4.04 10*6/uL — ABNORMAL LOW (ref 4.20–5.82)
RDW: 14.5 % (ref 11.0–14.6)
WBC: 5.8 10*3/uL (ref 4.0–10.3)
lymph#: 0.9 10*3/uL (ref 0.9–3.3)

## 2011-11-17 LAB — COMPREHENSIVE METABOLIC PANEL
ALT: 35 U/L (ref 0–53)
Albumin: 3.9 g/dL (ref 3.5–5.2)
CO2: 29 mEq/L (ref 19–32)
Chloride: 103 mEq/L (ref 96–112)
Glucose, Bld: 104 mg/dL — ABNORMAL HIGH (ref 70–99)
Potassium: 4.1 mEq/L (ref 3.5–5.3)
Sodium: 139 mEq/L (ref 135–145)
Total Protein: 6 g/dL (ref 6.0–8.3)

## 2011-11-17 MED ORDER — ONDANSETRON 8 MG/50ML IVPB (CHCC)
8.0000 mg | Freq: Once | INTRAVENOUS | Status: AC
  Administered 2011-11-17: 8 mg via INTRAVENOUS

## 2011-11-17 MED ORDER — BACITRACIN-POLYMYXIN B 500-10000 UNIT/GM OP OINT: TOPICAL_OINTMENT | Freq: Two times a day (BID) | OPHTHALMIC | Status: AC

## 2011-11-17 MED ORDER — SODIUM CHLORIDE 0.9 % IV SOLN
90.0000 mg/m2 | Freq: Once | INTRAVENOUS | Status: AC
  Administered 2011-11-17: 220 mg via INTRAVENOUS
  Filled 2011-11-17: qty 44

## 2011-11-17 MED ORDER — DEXAMETHASONE SODIUM PHOSPHATE 10 MG/ML IJ SOLN
10.0000 mg | Freq: Once | INTRAMUSCULAR | Status: AC
  Administered 2011-11-17: 10 mg via INTRAVENOUS

## 2011-11-17 MED ORDER — SODIUM CHLORIDE 0.9 % IJ SOLN
10.0000 mL | INTRAMUSCULAR | Status: DC | PRN
  Administered 2011-11-17: 10 mL
  Filled 2011-11-17: qty 10

## 2011-11-17 MED ORDER — ACETAMINOPHEN 325 MG PO TABS
650.0000 mg | ORAL_TABLET | Freq: Once | ORAL | Status: AC
  Administered 2011-11-17: 650 mg via ORAL

## 2011-11-17 MED ORDER — SODIUM CHLORIDE 0.9 % IV SOLN
375.0000 mg/m2 | Freq: Once | INTRAVENOUS | Status: AC
  Administered 2011-11-17: 900 mg via INTRAVENOUS
  Filled 2011-11-17: qty 90

## 2011-11-17 MED ORDER — SODIUM CHLORIDE 0.9 % IV SOLN
Freq: Once | INTRAVENOUS | Status: AC
  Administered 2011-11-17: 10:00:00 via INTRAVENOUS

## 2011-11-17 MED ORDER — HEPARIN SOD (PORK) LOCK FLUSH 100 UNIT/ML IV SOLN
500.0000 [IU] | Freq: Once | INTRAVENOUS | Status: AC | PRN
  Administered 2011-11-17: 500 [IU]
  Filled 2011-11-17: qty 5

## 2011-11-17 MED ORDER — DIPHENHYDRAMINE HCL 25 MG PO CAPS
50.0000 mg | ORAL_CAPSULE | Freq: Once | ORAL | Status: AC
  Administered 2011-11-17: 50 mg via ORAL

## 2011-11-17 NOTE — Patient Instructions (Signed)
[facility] Discharge Instructions for Patients Receiving Chemotherapy  Today you received the following chemotherapy agents Rituxan/Treanda To help prevent nausea and vomiting after your treatment, we encourage you to take your nausea medication as prescribed.  If you develop nausea and vomiting that is not controlled by your nausea medication, call the clinic. If it is after clinic hours your family physician or the after hours number for the clinic or go to the Emergency Department.   BELOW ARE SYMPTOMS THAT SHOULD BE REPORTED IMMEDIATELY:  *FEVER GREATER THAN 100.5 F  *CHILLS WITH OR WITHOUT FEVER  NAUSEA AND VOMITING THAT IS NOT CONTROLLED WITH YOUR NAUSEA MEDICATION  *UNUSUAL SHORTNESS OF BREATH  *UNUSUAL BRUISING OR BLEEDING  TENDERNESS IN MOUTH AND THROAT WITH OR WITHOUT PRESENCE OF ULCERS  *URINARY PROBLEMS  *BOWEL PROBLEMS  UNUSUAL RASH Items with * indicate a potential emergency and should be followed up as soon as possible.  One of the nurses will contact you 24 hours after your treatment. Please let the nurse know about any problems that you may have experienced. Feel free to call the clinic you have any questions or concerns. The clinic phone number is [phone].   I have been informed and understand all the instructions given to me. I know to contact the clinic, my physician, or go to the Emergency Department if any problems should occur. I do not have any questions at this time, but understand that I may call the clinic during office hours   should I have any questions or need assistance in obtaining follow up care.    __________________________________________  _____________  __________ Signature of Patient or Authorized Representative            Date                   Time    __________________________________________ Nurse's Signature    

## 2011-11-17 NOTE — Telephone Encounter (Signed)
Per staff messgae I have scheduled appts. JMW

## 2011-11-17 NOTE — Progress Notes (Signed)
Hematology and Oncology Follow Up Visit  Tom Fuller [id number] 07-17-1950 61 y.o. 11/17/2011 11:13 AM    CC: Tom Fuller. Tom Ridgel, [name], [name], [name]: A 61 year old gentleman with stage IIIA low-grade follicular lymphoma diagnosed May 2010.  Current therapy:  Bendamustine and rituximab started on 08/25/11. He is here for cycle 4 today.  Interim History:  Mr. Wainer presents today for a followup visit.  This is a pleasant gentleman with follicular lymphoma.  He has disease above and below the diaphragm.  He has cervical adenopathy as well as intra-abdominal adenopathy.  He had bulky adenopathy in the neck that has decreased dramatically since starting chemotherapy. Denies fever, chest pain, shortness of breath. He had dry heaves 1-2 days after the last cycle of chemotherapy, but no vomiting. Not taking anti-emetics. Fatigue increased from baseline, but patient still able to work and fatigue has not really affected performance status. Notices that fatigue has worsened with each cycle of chemotherapy. Appetite remains good. The patient has a stye to his right eye today. Reports that he has had several of these over the past 2-3 weeks. No eye drainage.  Medications: I have reviewed the patient's current medications. Current outpatient prescriptions:aspirin 325 MG EC tablet, Take 325 mg by mouth daily.  , Disp: , Rfl: ;  bacitracin-polymyxin b (POLYSPORIN) ophthalmic ointment, Place into both eyes every 12 (twelve) hours. apply to eye every 12 hours while awake, Disp: 3.5 g, Rfl: 0;  celecoxib (CELEBREX) 200 MG capsule, Take 200 mg by mouth daily.  , Disp: , Rfl:  esomeprazole (NEXIUM) 40 MG capsule, Take 1 capsule (40 mg total) by mouth daily. Office visit due now, Disp: 90 capsule, Rfl: 3;  fish oil-omega-3 fatty acids 1000 MG capsule, Take 2 g by mouth 2 (two) times daily.  , Disp: , Rfl: ;  lidocaine-prilocaine (EMLA) cream, Apply topically as needed.,  Disp: 30 g, Rfl: 2;  metoprolol (TOPROL-XL) 50 MG 24 hr tablet, Take 1 tablet (50 mg total) by mouth daily., Disp: 90 tablet, Rfl: 3 prochlorperazine (COMPAZINE) 10 MG tablet, Take 1 tablet (10 mg total) by mouth every 6 (six) hours as needed., Disp: 30 tablet, Rfl: 1;  valsartan-hydrochlorothiazide (DIOVAN HCT) 320-12.5 MG per tablet, 1 po qd, Disp: 90 tablet, Rfl: 3 No current facility-administered medications for this visit. Facility-Administered Medications Ordered in Other Visits: 0.9 %  sodium chloride infusion, , Intravenous, Once, Benjiman Core, MD, Last Rate: 20 mL/hr at 11/17/11 1000;  acetaminophen (TYLENOL) tablet 650 mg, 650 mg, Oral, Once, Benjiman Core, MD, 650 mg at 11/17/11 1004;  bendamustine (TREANDA) 220 mg in sodium chloride 0.9 % 500 mL chemo infusion, 90 mg/m2 (Treatment Plan Actual), Intravenous, Once, Benjiman Core, MD dexamethasone (DECADRON) injection 10 mg, 10 mg, Intravenous, Once, Benjiman Core, MD, 10 mg at 11/17/11 1005;  diphenhydrAMINE (BENADRYL) capsule 50 mg, 50 mg, Oral, Once, Benjiman Core, MD, 50 mg at 11/17/11 1004;  heparin lock flush 100 unit/mL, 500 Units, Intracatheter, Once PRN, Benjiman Core, MD;  ondansetron (ZOFRAN) IVPB 8 mg, 8 mg, Intravenous, Once, Benjiman Core, MD, 8 mg at 11/17/11 1005 riTUXimab (RITUXAN) 900 mg in sodium chloride 0.9 % 160 mL chemo infusion, 375 mg/m2 (Treatment Plan Actual), Intravenous, Once, Benjiman Core, MD, Last Rate: 200 mL/hr at 11/17/11 1113, 900 mg at 11/17/11 1113;  sodium chloride 0.9 % injection 10 mL, 10 mL, Intracatheter, PRN, Benjiman Core, MD  Allergies:  Allergies  Allergen Reactions  . Ampicillin     Past Medical History, Surgical history, Social history, and Family History were reviewed and updated.  Review of Systems: Constitutional:  Negative for fever, chills, night sweats, anorexia, weight loss, pain. Cardiovascular: no chest pain or dyspnea on exertion Respiratory: no cough, shortness of  breath, or wheezing Neurological: no TIA or stroke symptoms Dermatological: negative ENT: negative Skin: Negative. Gastrointestinal: no abdominal pain, change in bowel habits, or black or bloody stools Genito-Urinary: no dysuria, trouble voiding, or hematuria Hematological and Lymphatic: negative Breast: negative Musculoskeletal: negative Remaining ROS negative.  Physical Exam: Blood pressure 134/85, pulse 56, temperature 97.9 F (36.6 C), temperature source Oral, height 5\' 11"  (1.803 m), weight 263 lb 1.6 oz (119.341 kg). ECOG: 0 General appearance: alert Head: Normocephalic, without obvious abnormality, atraumatic Neck: no adenopathy, no carotid bruit, no JVD, supple, symmetrical, trachea midline and thyroid not enlarged, symmetric, no tenderness/mass/nodules Lymph nodes: Cervical adenopathy decreased dramatically. Size range between 0.5 to 1 cm now Heart:regular rate and rhythm, S1, S2 normal, no murmur, click, rub or gallop Lung:chest clear, no wheezing, rales, normal symmetric air entry,  Abdomen: soft, non-tender, without masses or organomegaly Skin: noted erythema and induration in the paraumbilical area.  EXT:no erythema, induration, or nodules  Lab Results: Lab Results  Component Value Date   WBC 5.8 11/17/2011   HGB 12.8* 11/17/2011   HCT 35.1* 11/17/2011   MCV 86.9 11/17/2011   PLT 155 11/17/2011     Chemistry      Component Value Date/Time   NA 140 10/20/2011 0819   NA 140 04/28/2011 0921   K 3.9 10/20/2011 0819   K 4.4 04/28/2011 0921   CL 105 10/20/2011 0819   CL 101 04/28/2011 0921   CO2 27 10/20/2011 0819   CO2 31 04/28/2011 0921   BUN 20 10/20/2011 0819   BUN 18 04/28/2011 0921   CREATININE 0.94 10/20/2011 0819   CREATININE 1.1 04/28/2011 0921      Component Value Date/Time   CALCIUM 9.3 10/20/2011 0819   CALCIUM 8.5 04/28/2011 0921   ALKPHOS 52 10/20/2011 0819   ALKPHOS 71 04/28/2011 0921   AST 14 10/20/2011 0819   AST 25 04/28/2011 0921   ALT 16  10/20/2011 0819   BILITOT 0.4 10/20/2011 0819   BILITOT 0.70 04/28/2011 0921     *RADIOLOGY REPORT*  Clinical Data: Restaging non-Hodgkins lymphoma. Last chemotherapy  administered last month.  CT NECK, CHEST, ABDOMEN AND PELVIS WITH CONTRAST  Technique: Multidetector CT imaging of the neck, chest, abdomen  and pelvis was performed using the standard protocol following the  bolus administration of intravenous contrast.  Contrast: OMNIPAQUE IOHEXOL 300 MG/ML SOLN  Comparison: Prior CTs 04/28/2011.  CT NECK  Findings: Previously demonstrated facial and cervical  lymphadenopathy has significantly improved. The large nodal mass  within the left parotid gland has nearly completely resolved.  There is a residual 12 mm node within the superficial portion of  the right parotid gland on image 30. A right submental node  measures 1.3 cm on image 38 (previously 2.8 cm). Small cervical  lymph nodes bilaterally are not pathologically enlarged.  Right IJ Port-A-Cath is in place. There is stable mild nodularity  of the thyroid gland. No lesions of the pharyngeal mucosal space  are identified.  There is bilateral maxillary and ethmoid sinus mucosal thickening.  The right mastoid air cells are partially opacified. The  visualized skull base appears normal. There is stable mild  cervical spondylosis.  IMPRESSION:  1. Marked improvement in cervical lymphadenopathy.  2. Mild progression in paranasal sinus and right mastoid air cell  opacification, possibly related to interval therapy.  CT CHEST  Findings: Previously demonstrated axillary lymphadenopathy has  resolved. There are no residual enlarged mediastinal or hilar  lymph nodes. There are small residual right paratracheal and  subcarinal lymph nodes. There is no pleural or pericardial  effusion.  5 mm subpleural right lower lobe nodule on image 35 is stable.  Small nodular densities in the right lung on images 15 and 30 were  not as well  seen previously due to motion but do not appear  significantly changed. The left lung is clear. There is no  confluent airspace opacity.  IMPRESSION:  1. Interval resolution of axillary and mediastinal  lymphadenopathy.  2. Similar right lung nodularity, likely benign.  CT ABDOMEN AND PELVIS  Findings: The liver, gallbladder, biliary system and pancreas  appear unchanged. The spleen remains normal size without focal  abnormality. The adrenal glands appear normal. There is a stable  small left renal cyst. There is no hydronephrosis.  Previously demonstrated extensive retroperitoneal and mesenteric  lymphadenopathy has improved. The largest remaining discrete lymph  nodes include a 12 mm node in the base of the mesentery on image 71  (previously 23 mm), a 13 mm left periaortic node on image 73  (previously 29 mm) and a 20 mm left periaortic node on image 80  (previously 32 mm). There is a 1.3 cm left common iliac node on  image 94. No enlarging lymph nodes are identified. There are no  residual enlarged lower pelvic or inguinal lymph nodes. There is  confluent soft tissue in the base the mesentery. No large vessel  occlusion is seen.  There is no bowel wall thickening or surrounding inflammatory  change. There is no ascites. The urinary bladder, prostate gland  and seminal vesicles appear unchanged. There are no suspicious  osseous findings.  IMPRESSION:  1. Marked improvement in mesenteric, retroperitoneal and upper  pelvic lymphadenopathy.  2. No residual lower pelvic or inguinal lymphadenopathy.  3. No acute findings.  Original Report Authenticated By: Gerrianne Scale, [name]  Impression and Plan: This a pleasant 61 year old gentleman with the following issues: 1. Stage III follicular lymphoma.  He developed bulky adenopathy as well as bulky neck adenopathy that has now improved with chemotherapy. He is tolerating chemotherapy well overall without any major toxicities. CT scan  performed after cycle 3 showed improvement in his adenopathy. Recommend that he complete 6 cycle of chemotherapy. Recommend that he proceed with cycle 4 today without dose modification.  2. Atrial fibrillation.  He is normal sinus rhythm at this point, followed by cardiologist.   3. Nausea/vomiting prophylaxis. Has compazine at home. 4. Right eye stye. Recommend warm compresses to eye 3-4 times per day. I have prescribed antibiotic eye drops as well due to the recurrent nature of this issue. 5. Follow-up. On 12/15/11 for cycle 5 of chemotherapy.   [place], [place] 6/20/201311:13 AM

## 2011-11-17 NOTE — Patient Instructions (Addendum)
Constipation: Use Miralax 1 capful mixed in water or juice daily.  Cough: You may use Mucinex 1 tablet twice a day for 10-14 days. If you have sinus drainage, you can use Claritin or Zyrtec.  Eyes: Apply warm compresses to eye 3-4 times a day for 5 days. Use eye antibiotics twice a day for 10 days.

## 2011-11-18 ENCOUNTER — Ambulatory Visit (HOSPITAL_BASED_OUTPATIENT_CLINIC_OR_DEPARTMENT_OTHER): Payer: BC Managed Care – PPO

## 2011-11-18 ENCOUNTER — Telehealth: Payer: Self-pay | Admitting: Oncology

## 2011-11-18 VITALS — BP 139/82 | HR 104 | Temp 98.2°F

## 2011-11-18 DIAGNOSIS — Z5111 Encounter for antineoplastic chemotherapy: Secondary | ICD-10-CM

## 2011-11-18 DIAGNOSIS — C8299 Follicular lymphoma, unspecified, extranodal and solid organ sites: Secondary | ICD-10-CM

## 2011-11-18 DIAGNOSIS — C8290 Follicular lymphoma, unspecified, unspecified site: Secondary | ICD-10-CM

## 2011-11-18 DIAGNOSIS — R599 Enlarged lymph nodes, unspecified: Secondary | ICD-10-CM

## 2011-11-18 MED ORDER — DEXAMETHASONE SODIUM PHOSPHATE 10 MG/ML IJ SOLN
10.0000 mg | Freq: Once | INTRAMUSCULAR | Status: AC
  Administered 2011-11-18: 10 mg via INTRAVENOUS

## 2011-11-18 MED ORDER — ONDANSETRON 8 MG/50ML IVPB (CHCC)
8.0000 mg | Freq: Once | INTRAVENOUS | Status: AC
  Administered 2011-11-18: 8 mg via INTRAVENOUS

## 2011-11-18 MED ORDER — HEPARIN SOD (PORK) LOCK FLUSH 100 UNIT/ML IV SOLN
500.0000 [IU] | Freq: Once | INTRAVENOUS | Status: AC | PRN
  Administered 2011-11-18: 500 [IU]
  Filled 2011-11-18: qty 5

## 2011-11-18 MED ORDER — SODIUM CHLORIDE 0.9 % IV SOLN
90.0000 mg/m2 | Freq: Once | INTRAVENOUS | Status: AC
  Administered 2011-11-18: 220 mg via INTRAVENOUS
  Filled 2011-11-18: qty 44

## 2011-11-18 MED ORDER — SODIUM CHLORIDE 0.9 % IJ SOLN
10.0000 mL | INTRAMUSCULAR | Status: DC | PRN
  Administered 2011-11-18: 10 mL
  Filled 2011-11-18: qty 10

## 2011-11-18 MED ORDER — SODIUM CHLORIDE 0.9 % IV SOLN
Freq: Once | INTRAVENOUS | Status: AC
  Administered 2011-11-18: 14:00:00 via INTRAVENOUS

## 2011-11-18 NOTE — Telephone Encounter (Signed)
Called pt, left message regarding appt in July 2013 lab, MD and chemo

## 2011-11-18 NOTE — Patient Instructions (Addendum)
[facility] Discharge Instructions for Patients Receiving Chemotherapy  Today you received the following chemotherapy agents Treanda  To help prevent nausea and vomiting after your treatment, we encourage you to take your nausea medication as prescribed.   If you develop nausea and vomiting that is not controlled by your nausea medication, call the clinic. If it is after clinic hours your family physician or the after hours number for the clinic or go to the Emergency Department.   BELOW ARE SYMPTOMS THAT SHOULD BE REPORTED IMMEDIATELY:  *FEVER GREATER THAN 100.5 F  *CHILLS WITH OR WITHOUT FEVER  NAUSEA AND VOMITING THAT IS NOT CONTROLLED WITH YOUR NAUSEA MEDICATION  *UNUSUAL SHORTNESS OF BREATH  *UNUSUAL BRUISING OR BLEEDING  TENDERNESS IN MOUTH AND THROAT WITH OR WITHOUT PRESENCE OF ULCERS  *URINARY PROBLEMS  *BOWEL PROBLEMS  UNUSUAL RASH Items with * indicate a potential emergency and should be followed up as soon as possible.  One of the nurses will contact you 24 hours after your treatment. Please let the nurse know about any problems that you may have experienced. Feel free to call the clinic you have any questions or concerns. The clinic phone number is [phone].   I have been informed and understand all the instructions given to me. I know to contact the clinic, my physician, or go to the Emergency Department if any problems should occur. I do not have any questions at this time, but understand that I may call the clinic during office hours   should I have any questions or need assistance in obtaining follow up care.    __________________________________________  _____________  __________ Signature of Patient or Authorized Representative            Date                   Time    __________________________________________ Nurse's Signature   Patient aware of next appointment; discharged home with no complaints.

## 2011-11-24 ENCOUNTER — Encounter: Payer: Self-pay | Admitting: *Deleted

## 2011-12-06 ENCOUNTER — Telehealth: Payer: Self-pay | Admitting: Oncology

## 2011-12-06 NOTE — Telephone Encounter (Signed)
Returned pt's call and change 7/19 appt to 1 pm per pt request. Pt has new d/t.

## 2011-12-15 ENCOUNTER — Telehealth: Payer: Self-pay | Admitting: Oncology

## 2011-12-15 ENCOUNTER — Ambulatory Visit (HOSPITAL_BASED_OUTPATIENT_CLINIC_OR_DEPARTMENT_OTHER): Payer: BC Managed Care – PPO | Admitting: Oncology

## 2011-12-15 ENCOUNTER — Other Ambulatory Visit (HOSPITAL_BASED_OUTPATIENT_CLINIC_OR_DEPARTMENT_OTHER): Payer: BC Managed Care – PPO | Admitting: Lab

## 2011-12-15 ENCOUNTER — Ambulatory Visit (HOSPITAL_BASED_OUTPATIENT_CLINIC_OR_DEPARTMENT_OTHER): Payer: BC Managed Care – PPO

## 2011-12-15 VITALS — BP 134/79 | HR 86 | Temp 98.5°F | Ht 71.0 in | Wt 264.8 lb

## 2011-12-15 VITALS — BP 111/78 | HR 89 | Temp 97.8°F

## 2011-12-15 DIAGNOSIS — I4891 Unspecified atrial fibrillation: Secondary | ICD-10-CM

## 2011-12-15 DIAGNOSIS — C8299 Follicular lymphoma, unspecified, extranodal and solid organ sites: Secondary | ICD-10-CM

## 2011-12-15 DIAGNOSIS — Z5112 Encounter for antineoplastic immunotherapy: Secondary | ICD-10-CM

## 2011-12-15 DIAGNOSIS — C8290 Follicular lymphoma, unspecified, unspecified site: Secondary | ICD-10-CM

## 2011-12-15 DIAGNOSIS — Z5111 Encounter for antineoplastic chemotherapy: Secondary | ICD-10-CM

## 2011-12-15 DIAGNOSIS — H00019 Hordeolum externum unspecified eye, unspecified eyelid: Secondary | ICD-10-CM

## 2011-12-15 DIAGNOSIS — C8589 Other specified types of non-Hodgkin lymphoma, extranodal and solid organ sites: Secondary | ICD-10-CM

## 2011-12-15 DIAGNOSIS — R599 Enlarged lymph nodes, unspecified: Secondary | ICD-10-CM

## 2011-12-15 LAB — CBC WITH DIFFERENTIAL/PLATELET
BASO%: 0.8 % (ref 0.0–2.0)
Eosinophils Absolute: 0.2 10*3/uL (ref 0.0–0.5)
MCHC: 36.3 g/dL — ABNORMAL HIGH (ref 32.0–36.0)
MONO#: 1.5 10*3/uL — ABNORMAL HIGH (ref 0.1–0.9)
MONO%: 33.5 % — ABNORMAL HIGH (ref 0.0–14.0)
NEUT#: 1.9 10*3/uL (ref 1.5–6.5)
RBC: 3.85 10*6/uL — ABNORMAL LOW (ref 4.20–5.82)
RDW: 16 % — ABNORMAL HIGH (ref 11.0–14.6)
WBC: 4.5 10*3/uL (ref 4.0–10.3)
nRBC: 0 % (ref 0–0)

## 2011-12-15 LAB — COMPREHENSIVE METABOLIC PANEL
ALT: 42 U/L (ref 0–53)
AST: 35 U/L (ref 0–37)
CO2: 30 mEq/L (ref 19–32)
Chloride: 105 mEq/L (ref 96–112)
Creatinine, Ser: 0.83 mg/dL (ref 0.50–1.35)
Sodium: 141 mEq/L (ref 135–145)
Total Bilirubin: 0.6 mg/dL (ref 0.3–1.2)
Total Protein: 6.1 g/dL (ref 6.0–8.3)

## 2011-12-15 MED ORDER — ACETAMINOPHEN 325 MG PO TABS
650.0000 mg | ORAL_TABLET | Freq: Once | ORAL | Status: AC
  Administered 2011-12-15: 650 mg via ORAL

## 2011-12-15 MED ORDER — SODIUM CHLORIDE 0.9 % IV SOLN
375.0000 mg/m2 | Freq: Once | INTRAVENOUS | Status: AC
  Administered 2011-12-15: 900 mg via INTRAVENOUS
  Filled 2011-12-15: qty 90

## 2011-12-15 MED ORDER — SODIUM CHLORIDE 0.9 % IV SOLN
Freq: Once | INTRAVENOUS | Status: AC
  Administered 2011-12-15: 10:00:00 via INTRAVENOUS

## 2011-12-15 MED ORDER — SODIUM CHLORIDE 0.9 % IJ SOLN
10.0000 mL | INTRAMUSCULAR | Status: DC | PRN
  Administered 2011-12-15: 10 mL
  Filled 2011-12-15: qty 10

## 2011-12-15 MED ORDER — DEXAMETHASONE SODIUM PHOSPHATE 10 MG/ML IJ SOLN
10.0000 mg | Freq: Once | INTRAMUSCULAR | Status: AC
  Administered 2011-12-15: 10 mg via INTRAVENOUS

## 2011-12-15 MED ORDER — DIPHENHYDRAMINE HCL 25 MG PO CAPS
50.0000 mg | ORAL_CAPSULE | Freq: Once | ORAL | Status: AC
  Administered 2011-12-15: 50 mg via ORAL

## 2011-12-15 MED ORDER — HEPARIN SOD (PORK) LOCK FLUSH 100 UNIT/ML IV SOLN
500.0000 [IU] | Freq: Once | INTRAVENOUS | Status: AC | PRN
  Administered 2011-12-15: 500 [IU]
  Filled 2011-12-15: qty 5

## 2011-12-15 MED ORDER — ONDANSETRON 8 MG/50ML IVPB (CHCC)
8.0000 mg | Freq: Once | INTRAVENOUS | Status: AC
  Administered 2011-12-15: 8 mg via INTRAVENOUS

## 2011-12-15 MED ORDER — SODIUM CHLORIDE 0.9 % IV SOLN
90.0000 mg/m2 | Freq: Once | INTRAVENOUS | Status: AC
  Administered 2011-12-15: 220 mg via INTRAVENOUS
  Filled 2011-12-15: qty 44

## 2011-12-15 NOTE — Progress Notes (Signed)
Hematology and Oncology Follow Up Visit  Tom Fuller [id number] 08-Jan-1951 61 y.o. 12/15/2011 9:36 AM    CC: Tom Canning. Tom Ridgel, MD  Tom Fuller, [name]  Tom Perla, [name]: A 61 year old gentleman with stage IIIA low-grade follicular lymphoma diagnosed May 2010.  Current therapy:  Bendamustine and rituximab started on 08/25/11. He is here for cycle 5 today.  Interim History:  Tom Fuller presents today for a followup visit.  This is a pleasant gentleman with follicular lymphoma.  He has disease above and below the diaphragm.  He has cervical adenopathy as well as intra-abdominal adenopathy.  He had bulky adenopathy in the neck that has decreased dramatically since starting chemotherapy. Denies fever, chest pain, shortness of breath. No vomiting reported at this time. Not taking anti-emetics. Fatigue increased from baseline, but patient still able to work and fatigue has not really affected performance status. Notices that fatigue has worsened with each cycle of chemotherapy. Appetite remains good.  No new complaints.    Medications: I have reviewed the patient's current medications. Current outpatient prescriptions:aspirin 325 MG EC tablet, Take 325 mg by mouth daily.  , Disp: , Rfl: ;  celecoxib (CELEBREX) 200 MG capsule, Take 200 mg by mouth daily.  , Disp: , Rfl: ;  esomeprazole (NEXIUM) 40 MG capsule, Take 1 capsule (40 mg total) by mouth daily. Office visit due now, Disp: 90 capsule, Rfl: 3;  fish oil-omega-3 fatty acids 1000 MG capsule, Take 2 g by mouth 2 (two) times daily.  , Disp: , Rfl:  lidocaine-prilocaine (EMLA) cream, Apply topically as needed., Disp: 30 g, Rfl: 2;  metoprolol (TOPROL-XL) 50 MG 24 hr tablet, Take 1 tablet (50 mg total) by mouth daily., Disp: 90 tablet, Rfl: 3;  prochlorperazine (COMPAZINE) 10 MG tablet, Take 1 tablet (10 mg total) by mouth every 6 (six) hours as needed., Disp: 30 tablet, Rfl: 1;  valsartan-hydrochlorothiazide (DIOVAN HCT)  320-12.5 MG per tablet, 1 po qd, Disp: 90 tablet, Rfl: 3  Allergies:  Allergies  Allergen Reactions  . Ampicillin     Past Medical History, Surgical history, Social history, and Family History were reviewed and updated.  Review of Systems: Constitutional:  Negative for fever, chills, night sweats, anorexia, weight loss, pain. Cardiovascular: no chest pain or dyspnea on exertion Respiratory: no cough, shortness of breath, or wheezing Neurological: no TIA or stroke symptoms Dermatological: negative ENT: negative Skin: Negative. Gastrointestinal: no abdominal pain, change in bowel habits, or black or bloody stools Genito-Urinary: no dysuria, trouble voiding, or hematuria Hematological and Lymphatic: negative Breast: negative Musculoskeletal: negative Remaining ROS negative.  Physical Exam: Blood pressure 134/79, pulse 86, temperature 98.5 F (36.9 C), temperature source Oral, height 5\' 11"  (1.803 m), weight 264 lb 12.8 oz (120.112 kg). ECOG: 0 General appearance: alert Head: Normocephalic, without obvious abnormality, atraumatic Neck: no adenopathy, no carotid bruit, no JVD, supple, symmetrical, trachea midline and thyroid not enlarged, symmetric, no tenderness/mass/nodules Lymph nodes: Cervical adenopathy decreased dramatically. Size range between 0.5 to 1 cm now Heart:regular rate and rhythm, S1, S2 normal, no murmur, click, rub or gallop Lung:chest clear, no wheezing, rales, normal symmetric air entry,  Abdomen: soft, non-tender, without masses or organomegaly Skin: noted erythema and induration in the paraumbilical area.  EXT:no erythema, induration, or nodules  Lab Results: Lab Results  Component Value Date   WBC 4.5 12/15/2011   HGB 13.0 12/15/2011   HCT 35.9* 12/15/2011   MCV 93.3 12/15/2011   PLT 211 12/15/2011  Chemistry      Component Value Date/Time   NA 139 11/17/2011 0813   NA 140 04/28/2011 0921   K 4.1 11/17/2011 0813   K 4.4 04/28/2011 0921   CL 103  11/17/2011 0813   CL 101 04/28/2011 0921   CO2 29 11/17/2011 0813   CO2 31 04/28/2011 0921   BUN 14 11/17/2011 0813   BUN 18 04/28/2011 0921   CREATININE 0.84 11/17/2011 0813   CREATININE 1.1 04/28/2011 0921      Component Value Date/Time   CALCIUM 9.5 11/17/2011 0813   CALCIUM 8.5 04/28/2011 0921   ALKPHOS 53 11/17/2011 0813   ALKPHOS 71 04/28/2011 0921   AST 30 11/17/2011 0813   AST 25 04/28/2011 0921   ALT 35 11/17/2011 0813   BILITOT 0.6 11/17/2011 0813   BILITOT 0.70 04/28/2011 0921     Impression and Plan: This a pleasant 61 year old gentleman with the following issues: 1. Stage III follicular lymphoma.  He developed bulky adenopathy as well as bulky neck adenopathy that has now improved with chemotherapy. He is tolerating chemotherapy well overall without any major toxicities. CT scan performed after cycle 3 showed improvement in his adenopathy. Recommend that he complete 6 cycle of chemotherapy. Recommend that he proceed with cycle 5 today without dose modification.  2. Atrial fibrillation.  He is normal sinus rhythm at this point, followed by cardiologist.   3. Nausea/vomiting prophylaxis. Has compazine at home. 4. Right eye stye. Recommend warm compresses to eye 3-4 times per day. I have prescribed antibiotic eye drops as well due to the recurrent nature of this issue. 5. Follow-up. On 8/15 for cycle 6 of chemotherapy. He is scheduled for a CT scan in 01/2012 upon completion of his 6th  cycle of chemotherapy.   [name] 7/18/20139:36 AM

## 2011-12-15 NOTE — Telephone Encounter (Signed)
Appts made and printed for pt  aom 

## 2011-12-15 NOTE — Patient Instructions (Addendum)
[facility] Discharge Instructions for Patients Receiving Chemotherapy  Today you received the following chemotherapy agents Rituxan and Treanda.  To help prevent nausea and vomiting after your treatment, we encourage you to take your nausea medication. Begin taking it as often as prescribed for by Dr. Clelia Croft.    If you develop nausea and vomiting that is not controlled by your nausea medication, call the clinic. If it is after clinic hours your family physician or the after hours number for the clinic or go to the Emergency Department.   BELOW ARE SYMPTOMS THAT SHOULD BE REPORTED IMMEDIATELY:  *FEVER GREATER THAN 100.5 F  *CHILLS WITH OR WITHOUT FEVER  NAUSEA AND VOMITING THAT IS NOT CONTROLLED WITH YOUR NAUSEA MEDICATION  *UNUSUAL SHORTNESS OF BREATH  *UNUSUAL BRUISING OR BLEEDING  TENDERNESS IN MOUTH AND THROAT WITH OR WITHOUT PRESENCE OF ULCERS  *URINARY PROBLEMS  *BOWEL PROBLEMS  UNUSUAL RASH Items with * indicate a potential emergency and should be followed up as soon as possible.  One of the nurses will contact you 24 hours after your treatment. Please let the nurse know about any problems that you may have experienced. Feel free to call the clinic you have any questions or concerns. The clinic phone number is [phone].   I have been informed and understand all the instructions given to me. I know to contact the clinic, my physician, or go to the Emergency Department if any problems should occur. I do not have any questions at this time, but understand that I may call the clinic during office hours   should I have any questions or need assistance in obtaining follow up care.    __________________________________________  _____________  __________ Signature of Patient or Authorized Representative            Date                   Time    __________________________________________ Nurse's Signature

## 2011-12-16 ENCOUNTER — Ambulatory Visit (HOSPITAL_BASED_OUTPATIENT_CLINIC_OR_DEPARTMENT_OTHER): Payer: BC Managed Care – PPO

## 2011-12-16 VITALS — BP 136/82 | HR 95 | Temp 98.6°F

## 2011-12-16 DIAGNOSIS — C8299 Follicular lymphoma, unspecified, extranodal and solid organ sites: Secondary | ICD-10-CM

## 2011-12-16 DIAGNOSIS — C8290 Follicular lymphoma, unspecified, unspecified site: Secondary | ICD-10-CM

## 2011-12-16 DIAGNOSIS — Z5111 Encounter for antineoplastic chemotherapy: Secondary | ICD-10-CM

## 2011-12-16 DIAGNOSIS — R599 Enlarged lymph nodes, unspecified: Secondary | ICD-10-CM

## 2011-12-16 MED ORDER — HEPARIN SOD (PORK) LOCK FLUSH 100 UNIT/ML IV SOLN
500.0000 [IU] | Freq: Once | INTRAVENOUS | Status: AC | PRN
  Administered 2011-12-16: 500 [IU]
  Filled 2011-12-16: qty 5

## 2011-12-16 MED ORDER — DEXAMETHASONE SODIUM PHOSPHATE 10 MG/ML IJ SOLN
10.0000 mg | Freq: Once | INTRAMUSCULAR | Status: AC
  Administered 2011-12-16: 10 mg via INTRAVENOUS

## 2011-12-16 MED ORDER — SODIUM CHLORIDE 0.9 % IV SOLN
Freq: Once | INTRAVENOUS | Status: AC
  Administered 2011-12-16: 13:00:00 via INTRAVENOUS

## 2011-12-16 MED ORDER — SODIUM CHLORIDE 0.9 % IJ SOLN
10.0000 mL | INTRAMUSCULAR | Status: DC | PRN
  Administered 2011-12-16: 10 mL
  Filled 2011-12-16: qty 10

## 2011-12-16 MED ORDER — SODIUM CHLORIDE 0.9 % IV SOLN
90.0000 mg/m2 | Freq: Once | INTRAVENOUS | Status: AC
  Administered 2011-12-16: 220 mg via INTRAVENOUS
  Filled 2011-12-16: qty 44

## 2011-12-16 MED ORDER — ONDANSETRON 8 MG/50ML IVPB (CHCC)
8.0000 mg | Freq: Once | INTRAVENOUS | Status: AC
  Administered 2011-12-16: 8 mg via INTRAVENOUS

## 2011-12-16 NOTE — Patient Instructions (Addendum)
[facility] Discharge Instructions for Patients Receiving Chemotherapy  Today you received the following chemotherapy agents treanda To help prevent nausea and vomiting after your treatment, we encourage you to take your nausea medication   Take it as often as prescribed.   If you develop nausea and vomiting that is not controlled by your nausea medication, call the clinic. If it is after clinic hours your family physician or the after hours number for the clinic or go to the Emergency Department.   BELOW ARE SYMPTOMS THAT SHOULD BE REPORTED IMMEDIATELY:  *FEVER GREATER THAN 100.5 F  *CHILLS WITH OR WITHOUT FEVER  NAUSEA AND VOMITING THAT IS NOT CONTROLLED WITH YOUR NAUSEA MEDICATION  *UNUSUAL SHORTNESS OF BREATH  *UNUSUAL BRUISING OR BLEEDING  TENDERNESS IN MOUTH AND THROAT WITH OR WITHOUT PRESENCE OF ULCERS  *URINARY PROBLEMS  *BOWEL PROBLEMS  UNUSUAL RASH Items with * indicate a potential emergency and should be followed up as soon as possible.  If this is your first treatment one of the nurses will contact you 24 hours after your treatment. Please let the nurse know about any problems that you may have experienced. Feel free to call the clinic you have any questions or concerns. The clinic phone number is [phone].   I have been informed and understand all the instructions given to me. I know to contact the clinic, my physician, or go to the Emergency Department if any problems should occur. I do not have any questions at this time, but understand that I may call the clinic during office hours   should I have any questions or need assistance in obtaining follow up care.    __________________________________________  _____________  __________ Signature of Patient or Authorized Representative            Date                   Time    __________________________________________ Nurse's Signature

## 2012-01-12 ENCOUNTER — Encounter: Payer: Self-pay | Admitting: Oncology

## 2012-01-12 ENCOUNTER — Ambulatory Visit (HOSPITAL_BASED_OUTPATIENT_CLINIC_OR_DEPARTMENT_OTHER): Payer: BC Managed Care – PPO

## 2012-01-12 ENCOUNTER — Other Ambulatory Visit (HOSPITAL_BASED_OUTPATIENT_CLINIC_OR_DEPARTMENT_OTHER): Payer: BC Managed Care – PPO | Admitting: Lab

## 2012-01-12 ENCOUNTER — Ambulatory Visit (HOSPITAL_BASED_OUTPATIENT_CLINIC_OR_DEPARTMENT_OTHER): Payer: BC Managed Care – PPO | Admitting: Oncology

## 2012-01-12 VITALS — BP 111/74 | HR 60 | Temp 97.3°F | Resp 18

## 2012-01-12 VITALS — BP 114/72 | HR 91 | Temp 97.9°F | Resp 20 | Ht 71.0 in | Wt 267.1 lb

## 2012-01-12 DIAGNOSIS — C8589 Other specified types of non-Hodgkin lymphoma, extranodal and solid organ sites: Secondary | ICD-10-CM

## 2012-01-12 DIAGNOSIS — Z5112 Encounter for antineoplastic immunotherapy: Secondary | ICD-10-CM

## 2012-01-12 DIAGNOSIS — Z5111 Encounter for antineoplastic chemotherapy: Secondary | ICD-10-CM

## 2012-01-12 DIAGNOSIS — I4891 Unspecified atrial fibrillation: Secondary | ICD-10-CM

## 2012-01-12 DIAGNOSIS — C8290 Follicular lymphoma, unspecified, unspecified site: Secondary | ICD-10-CM

## 2012-01-12 DIAGNOSIS — C8299 Follicular lymphoma, unspecified, extranodal and solid organ sites: Secondary | ICD-10-CM

## 2012-01-12 DIAGNOSIS — R599 Enlarged lymph nodes, unspecified: Secondary | ICD-10-CM

## 2012-01-12 LAB — CBC WITH DIFFERENTIAL/PLATELET
BASO%: 0.8 % (ref 0.0–2.0)
EOS%: 8.1 % — ABNORMAL HIGH (ref 0.0–7.0)
HCT: 36 % — ABNORMAL LOW (ref 38.4–49.9)
LYMPH%: 16.6 % (ref 14.0–49.0)
MCH: 33.9 pg — ABNORMAL HIGH (ref 27.2–33.4)
MCHC: 36 g/dL (ref 32.0–36.0)
MCV: 94.1 fL (ref 79.3–98.0)
MONO#: 1.4 10*3/uL — ABNORMAL HIGH (ref 0.1–0.9)
MONO%: 34.3 % — ABNORMAL HIGH (ref 0.0–14.0)
NEUT%: 40.2 % (ref 39.0–75.0)
Platelets: 169 10*3/uL (ref 140–400)
RBC: 3.82 10*6/uL — ABNORMAL LOW (ref 4.20–5.82)
WBC: 4.2 10*3/uL (ref 4.0–10.3)

## 2012-01-12 LAB — COMPREHENSIVE METABOLIC PANEL
Alkaline Phosphatase: 56 U/L (ref 39–117)
BUN: 17 mg/dL (ref 6–23)
Glucose, Bld: 97 mg/dL (ref 70–99)
Sodium: 139 mEq/L (ref 135–145)
Total Bilirubin: 0.6 mg/dL (ref 0.3–1.2)

## 2012-01-12 MED ORDER — ONDANSETRON 8 MG/50ML IVPB (CHCC)
8.0000 mg | Freq: Once | INTRAVENOUS | Status: AC
  Administered 2012-01-12: 8 mg via INTRAVENOUS

## 2012-01-12 MED ORDER — HEPARIN SOD (PORK) LOCK FLUSH 100 UNIT/ML IV SOLN
500.0000 [IU] | Freq: Once | INTRAVENOUS | Status: AC | PRN
  Administered 2012-01-12: 500 [IU]
  Filled 2012-01-12: qty 5

## 2012-01-12 MED ORDER — SODIUM CHLORIDE 0.9 % IV SOLN
90.0000 mg/m2 | Freq: Once | INTRAVENOUS | Status: AC
  Administered 2012-01-12: 220 mg via INTRAVENOUS
  Filled 2012-01-12: qty 44

## 2012-01-12 MED ORDER — SODIUM CHLORIDE 0.9 % IV SOLN
375.0000 mg/m2 | Freq: Once | INTRAVENOUS | Status: AC
  Administered 2012-01-12: 900 mg via INTRAVENOUS
  Filled 2012-01-12: qty 90

## 2012-01-12 MED ORDER — DIPHENHYDRAMINE HCL 25 MG PO CAPS
50.0000 mg | ORAL_CAPSULE | Freq: Once | ORAL | Status: AC
  Administered 2012-01-12: 50 mg via ORAL

## 2012-01-12 MED ORDER — DEXAMETHASONE SODIUM PHOSPHATE 10 MG/ML IJ SOLN
10.0000 mg | Freq: Once | INTRAMUSCULAR | Status: AC
  Administered 2012-01-12: 10 mg via INTRAVENOUS

## 2012-01-12 MED ORDER — SODIUM CHLORIDE 0.9 % IJ SOLN
10.0000 mL | INTRAMUSCULAR | Status: DC | PRN
  Administered 2012-01-12: 10 mL
  Filled 2012-01-12: qty 10

## 2012-01-12 MED ORDER — SODIUM CHLORIDE 0.9 % IV SOLN
Freq: Once | INTRAVENOUS | Status: AC
  Administered 2012-01-12: 10:00:00 via INTRAVENOUS

## 2012-01-12 MED ORDER — ACETAMINOPHEN 325 MG PO TABS
650.0000 mg | ORAL_TABLET | Freq: Once | ORAL | Status: AC
  Administered 2012-01-12: 650 mg via ORAL

## 2012-01-12 NOTE — Patient Instructions (Addendum)
[facility]cer Center Discharge Instructions for Patients Receiving Chemotherapy  Today you received the following chemotherapy agents Rituxan and Treanda  To help prevent nausea and vomiting after your treatment, we encourage you to take your nausea medication.   If you develop nausea and vomiting that is not controlled by your nausea medication, call the clinic. If it is after clinic hours your family physician or the after hours number for the clinic or go to the Emergency Department.   BELOW ARE SYMPTOMS THAT SHOULD BE REPORTED IMMEDIATELY:  *FEVER GREATER THAN 100.5 F  *CHILLS WITH OR WITHOUT FEVER  NAUSEA AND VOMITING THAT IS NOT CONTROLLED WITH YOUR NAUSEA MEDICATION  *UNUSUAL SHORTNESS OF BREATH  *UNUSUAL BRUISING OR BLEEDING  TENDERNESS IN MOUTH AND THROAT WITH OR WITHOUT PRESENCE OF ULCERS  *URINARY PROBLEMS  *BOWEL PROBLEMS  UNUSUAL RASH Items with * indicate a potential emergency and should be followed up as soon as possible.  One of the nurses will contact you 24 hours after your treatment. Please let the nurse know about any problems that you may have experienced. Feel free to call the clinic you have any questions or concerns. The clinic phone number is [phone].   I have been informed and understand all the instructions given to me. I know to contact the clinic, my physician, or go to the Emergency Department if any problems should occur. I do not have any questions at this time, but understand that I may call the clinic during office hours   should I have any questions or need assistance in obtaining follow up care.    __________________________________________  _____________  __________ Signature of Patient or Authorized Representative            Date                   Time    __________________________________________ Nurse's Signature    

## 2012-01-12 NOTE — Progress Notes (Signed)
Hematology and Oncology Follow Up Visit  Tom Fuller [id number] 01-30-1951 61 y.o. 01/12/2012 1:22 PM    CC: [name], [name], [name], [name]: A 61 year old gentleman with stage IIIA low-grade follicular lymphoma diagnosed May 2010.  Current therapy:  Bendamustine and rituximab started on 08/25/11. He is here for cycle 5 today.  Interim History:  Tom Fuller presents today for a followup visit.  This is a pleasant gentleman with follicular lymphoma.  He has disease above and below the diaphragm.  He has cervical adenopathy as well as intra-abdominal adenopathy.  He had bulky adenopathy in the neck that has decreased dramatically since starting chemotherapy. Denies fever, chest pain, shortness of breath. No vomiting reported at this time. Not taking anti-emetics. Fatigue increased from baseline, but patient still able to work and fatigue has not really affected performance status. Notices that fatigue has worsened with each cycle of chemotherapy. Appetite remains good. No new complaints.    Medications: I have reviewed the patient's current medications. Current outpatient prescriptions:aspirin 325 MG EC tablet, Take 325 mg by mouth daily.  , Disp: , Rfl: ;  celecoxib (CELEBREX) 200 MG capsule, Take 200 mg by mouth daily.  , Disp: , Rfl: ;  esomeprazole (NEXIUM) 40 MG capsule, Take 1 capsule (40 mg total) by mouth daily. Office visit due now, Disp: 90 capsule, Rfl: 3;  fish oil-omega-3 fatty acids 1000 MG capsule, Take 2 g by mouth 2 (two) times daily.  , Disp: , Rfl:  lidocaine-prilocaine (EMLA) cream, Apply topically as needed., Disp: 30 g, Rfl: 2;  metoprolol (TOPROL-XL) 50 MG 24 hr tablet, Take 1 tablet (50 mg total) by mouth daily., Disp: 90 tablet, Rfl: 3;  prochlorperazine (COMPAZINE) 10 MG tablet, Take 1 tablet (10 mg total) by mouth every 6 (six) hours as needed., Disp: 30 tablet, Rfl: 1;  valsartan-hydrochlorothiazide (DIOVAN HCT)  320-12.5 MG per tablet, 1 po qd, Disp: 90 tablet, Rfl: 3 No current facility-administered medications for this visit. Facility-Administered Medications Ordered in Other Visits: 0.9 %  sodium chloride infusion, , Intravenous, Once, Myrtis Ser, NP, Last Rate: 20 mL/hr at 01/12/12 0940;  acetaminophen (TYLENOL) tablet 650 mg, 650 mg, Oral, Once, Myrtis Ser, NP, 650 mg at 01/12/12 0948 bendamustine (TREANDA) 220 mg in sodium chloride 0.9 % 500 mL chemo infusion, 90 mg/m2 (Treatment Plan Actual), Intravenous, Once, Myrtis Ser, NP, Last Rate: 544 mL/hr at 01/12/12 1239, 220 mg at 01/12/12 1239;  dexamethasone (DECADRON) injection 10 mg, 10 mg, Intravenous, Once, Myrtis Ser, NP, 10 mg at 01/12/12 1212;  diphenhydrAMINE (BENADRYL) capsule 50 mg, 50 mg, Oral, Once, Myrtis Ser, NP, 50 mg at 01/12/12 0948 heparin lock flush 100 unit/mL, 500 Units, Intracatheter, Once PRN, Myrtis Ser, NP;  ondansetron (ZOFRAN) IVPB 8 mg, 8 mg, Intravenous, Once, Myrtis Ser, NP, 8 mg at 01/12/12 1212;  riTUXimab (RITUXAN) 900 mg in sodium chloride 0.9 % 160 mL chemo infusion, 375 mg/m2 (Treatment Plan Actual), Intravenous, Once, Myrtis Ser, NP, 900 mg at 01/12/12 1054 sodium chloride 0.9 % injection 10 mL, 10 mL, Intracatheter, PRN, Myrtis Ser, NP  Allergies:  Allergies  Allergen Reactions  . Ampicillin     Past Medical History, Surgical history, Social history, and Family History were reviewed and updated.  Review of Systems: Constitutional:  Negative for fever, chills, night sweats, anorexia, weight loss, pain. Cardiovascular: no chest pain or dyspnea on exertion  Respiratory: no cough, shortness of breath, or wheezing Neurological: no TIA or stroke symptoms Dermatological: negative ENT: negative Skin: Negative. Gastrointestinal: no abdominal pain, change in bowel habits, or black or bloody stools Genito-Urinary: no dysuria, trouble voiding, or  hematuria Hematological and Lymphatic: negative Breast: negative Musculoskeletal: negative Remaining ROS negative.  Physical Exam: Blood pressure 114/72, pulse 91, temperature 97.9 F (36.6 C), temperature source Oral, resp. rate 20, height 5\' 11"  (1.803 m), weight 267 lb 1.6 oz (121.156 kg). ECOG: 0 General appearance: alert Head: Normocephalic, without obvious abnormality, atraumatic Neck: no adenopathy, no carotid bruit, no JVD, supple, symmetrical, trachea midline and thyroid not enlarged, symmetric, no tenderness/mass/nodules Lymph nodes: Cervical adenopathy decreased dramatically. Size range between 0.5 to 1 cm now Heart:regular rate and rhythm, S1, S2 normal, no murmur, click, rub or gallop Lung:chest clear, no wheezing, rales, normal symmetric air entry,  Abdomen: soft, non-tender, without masses or organomegaly Skin: noted erythema and induration in the paraumbilical area.  EXT:no erythema, induration, or nodules  Lab Results: Lab Results  Component Value Date   WBC 4.2 01/12/2012   HGB 13.0 01/12/2012   HCT 36.0* 01/12/2012   MCV 94.1 01/12/2012   PLT 169 01/12/2012     Chemistry      Component Value Date/Time   NA 141 12/15/2011 0859   NA 140 04/28/2011 0921   K 3.8 12/15/2011 0859   K 4.4 04/28/2011 0921   CL 105 12/15/2011 0859   CL 101 04/28/2011 0921   CO2 30 12/15/2011 0859   CO2 31 04/28/2011 0921   BUN 18 12/15/2011 0859   BUN 18 04/28/2011 0921   CREATININE 0.83 12/15/2011 0859   CREATININE 1.1 04/28/2011 0921      Component Value Date/Time   CALCIUM 9.2 12/15/2011 0859   CALCIUM 8.5 04/28/2011 0921   ALKPHOS 47 12/15/2011 0859   ALKPHOS 71 04/28/2011 0921   AST 35 12/15/2011 0859   AST 25 04/28/2011 0921   ALT 42 12/15/2011 0859   BILITOT 0.6 12/15/2011 0859   BILITOT 0.70 04/28/2011 0921     Impression and Plan: This a pleasant 61 year old gentleman with the following issues: 1. Stage III follicular lymphoma.  He developed bulky adenopathy as well as  bulky neck adenopathy that has now improved with chemotherapy. He is tolerating chemotherapy well overall without any major toxicities. CT scan performed after cycle 3 showed improvement in his adenopathy. Recommend that he complete 6 cycle of chemotherapy. Recommend that he proceed with cycle 6 today without dose modification.  2. Atrial fibrillation.  He is normal sinus rhythm at this point, followed by cardiologist.   3. Nausea/vomiting prophylaxis. Has compazine at home. 4. Follow-up. He is scheduled for restaging Ct scans next month with a follow-up visit thereafter.   Clenton Pare 8/15/20131:22 PM

## 2012-01-13 ENCOUNTER — Ambulatory Visit (HOSPITAL_BASED_OUTPATIENT_CLINIC_OR_DEPARTMENT_OTHER): Payer: BC Managed Care – PPO

## 2012-01-13 VITALS — BP 140/91 | HR 92 | Temp 97.9°F | Resp 20

## 2012-01-13 DIAGNOSIS — R599 Enlarged lymph nodes, unspecified: Secondary | ICD-10-CM

## 2012-01-13 DIAGNOSIS — C8290 Follicular lymphoma, unspecified, unspecified site: Secondary | ICD-10-CM

## 2012-01-13 DIAGNOSIS — C8299 Follicular lymphoma, unspecified, extranodal and solid organ sites: Secondary | ICD-10-CM

## 2012-01-13 DIAGNOSIS — Z5112 Encounter for antineoplastic immunotherapy: Secondary | ICD-10-CM

## 2012-01-13 MED ORDER — DEXAMETHASONE SODIUM PHOSPHATE 10 MG/ML IJ SOLN
10.0000 mg | Freq: Once | INTRAMUSCULAR | Status: AC
  Administered 2012-01-13: 10 mg via INTRAVENOUS

## 2012-01-13 MED ORDER — SODIUM CHLORIDE 0.9 % IV SOLN
Freq: Once | INTRAVENOUS | Status: AC
  Administered 2012-01-13: 14:00:00 via INTRAVENOUS

## 2012-01-13 MED ORDER — SODIUM CHLORIDE 0.9 % IV SOLN
90.0000 mg/m2 | Freq: Once | INTRAVENOUS | Status: AC
  Administered 2012-01-13: 220 mg via INTRAVENOUS
  Filled 2012-01-13: qty 44

## 2012-01-13 MED ORDER — SODIUM CHLORIDE 0.9 % IJ SOLN
10.0000 mL | INTRAMUSCULAR | Status: DC | PRN
  Administered 2012-01-13: 10 mL
  Filled 2012-01-13: qty 10

## 2012-01-13 MED ORDER — HEPARIN SOD (PORK) LOCK FLUSH 100 UNIT/ML IV SOLN
500.0000 [IU] | Freq: Once | INTRAVENOUS | Status: AC | PRN
  Administered 2012-01-13: 500 [IU]
  Filled 2012-01-13: qty 5

## 2012-01-13 MED ORDER — ONDANSETRON 8 MG/50ML IVPB (CHCC)
8.0000 mg | Freq: Once | INTRAVENOUS | Status: AC
  Administered 2012-01-13: 8 mg via INTRAVENOUS

## 2012-01-13 NOTE — Patient Instructions (Addendum)
[facility] Discharge Instructions for Patients Receiving Chemotherapy  Today you received the following chemotherapy agents Treanda   If you develop nausea and vomiting that is not controlled by your nausea medication, call the clinic. If it is after clinic hours your family physician or the after hours number for the clinic or go to the Emergency Department.   BELOW ARE SYMPTOMS THAT SHOULD BE REPORTED IMMEDIATELY:  *FEVER GREATER THAN 100.5 F  *CHILLS WITH OR WITHOUT FEVER  NAUSEA AND VOMITING THAT IS NOT CONTROLLED WITH YOUR NAUSEA MEDICATION  *UNUSUAL SHORTNESS OF BREATH  *UNUSUAL BRUISING OR BLEEDING  TENDERNESS IN MOUTH AND THROAT WITH OR WITHOUT PRESENCE OF ULCERS  *URINARY PROBLEMS  *BOWEL PROBLEMS  UNUSUAL RASH Items with * indicate a potential emergency and should be followed up as soon as possible.   Feel free to call the clinic you have any questions or concerns. The clinic phone number is [phone].   I have been informed and understand all the instructions given to me. I know to contact the clinic, my physician, or go to the Emergency Department if any problems should occur. I do not have any questions at this time, but understand that I may call the clinic during office hours   should I have any questions or need assistance in obtaining follow up care.    __________________________________________  _____________  __________ Signature of Patient or Authorized Representative            Date                   Time    __________________________________________ Nurse's Signature

## 2012-02-09 ENCOUNTER — Encounter: Payer: Self-pay | Admitting: Internal Medicine

## 2012-02-09 ENCOUNTER — Ambulatory Visit (INDEPENDENT_AMBULATORY_CARE_PROVIDER_SITE_OTHER): Payer: BC Managed Care – PPO | Admitting: Internal Medicine

## 2012-02-09 VITALS — BP 128/88 | HR 95 | Temp 98.7°F | Wt 260.8 lb

## 2012-02-09 DIAGNOSIS — J209 Acute bronchitis, unspecified: Secondary | ICD-10-CM

## 2012-02-09 MED ORDER — HYDROCODONE-HOMATROPINE 5-1.5 MG/5ML PO SYRP: 5.0000 mL | ORAL_SOLUTION | Freq: Four times a day (QID) | ORAL | Status: AC | PRN

## 2012-02-09 MED ORDER — DOXYCYCLINE HYCLATE 100 MG PO TABS: ORAL_TABLET | ORAL | Status: DC

## 2012-02-09 NOTE — Progress Notes (Signed)
  Subjective:    Patient ID: Tom Fuller, male    DOB: 03/18/1951, 61 y.o.   MRN: [id number]  HPI 2 weeks ago he felt a cough productive of yellow sputum. This was associated with shortness of breath at times but not wheezing. It has evolved into a barking cough with scant sputum production. He treated this with over-the-counter medicines ;he believes that these were "allergy medicines" but he denies significant itchy, watery eyes or sneezing. He has had some mucosal discharge from the eyes    Review of Systems He denies nasal congestion/obstruction; nasal purulence; facial pain; anosmia; fatigue; fever; headache; halitosis; earache and dental pain.  He has been on chemotherapy for follicular lymphoma. This was complicated by oral ulcers     Objective:   Physical Exam General appearance:good health ;well nourished; no acute distress or increased work of breathing is present.  No  lymphadenopathy about the head, neck, or axilla noted.   Eyes: No conjunctival inflammation or lid edema is present. Extraocular motion intact. Vision normal with lenses Ears:  External ear exam shows no significant lesions or deformities.  Otoscopic examination reveals minor scarring of right TM. Wax in left canal Nose:  External nasal examination shows no deformity or inflammation. Nasal mucosa are pink and moist without lesions or exudates. R septal  deviation.No obstruction to airflow.   Oral exam: Dental hygiene is good;2 aphthous lesions of lower lip .There is no oropharyngeal erythema or exudate noted.   Neck:  No deformities, thyromegaly, masses, or tenderness noted.   Supple with full range of motion without pain.   Heart:  Normal rate and regular rhythm. S1 and S2 normal without gallop, murmur, click, rub or other extra sounds.   Lungs:Chest clear to auscultation; no wheezes, rhonchi,rales ,or rubs present.No increased work of breathing.    Extremities:  No cyanosis, edema, or clubbing  noted    Skin:  Warm & dry           Assessment & Plan:   #1 acute bronchitis w/o bronchospasm #2 aphthous ulcers Plan: See orders and recommendations

## 2012-02-09 NOTE — Patient Instructions (Addendum)
Plain Mucinex for thick secretions ;force NON dairy fluids . Use a Neti pot daily as needed for sinus congestion; going from open side to congested side . Nasal cleansing in the shower as discussed. Make sure that all residual soap is removed to prevent irritation.  Plain Allegra 160 daily as needed for itchy eyes & sneezing.    

## 2012-02-14 ENCOUNTER — Ambulatory Visit (HOSPITAL_COMMUNITY)
Admission: RE | Admit: 2012-02-14 | Discharge: 2012-02-14 | Disposition: A | Discharge status: Home / Self Care | Payer: BC Managed Care – PPO | Source: Ambulatory Visit | Attending: Oncology | Admitting: Oncology

## 2012-02-14 ENCOUNTER — Encounter (HOSPITAL_COMMUNITY): Payer: Self-pay

## 2012-02-14 ENCOUNTER — Other Ambulatory Visit (HOSPITAL_BASED_OUTPATIENT_CLINIC_OR_DEPARTMENT_OTHER): Payer: BC Managed Care – PPO | Admitting: Lab

## 2012-02-14 DIAGNOSIS — R059 Cough, unspecified: Secondary | ICD-10-CM | POA: Insufficient documentation

## 2012-02-14 DIAGNOSIS — C8299 Follicular lymphoma, unspecified, extranodal and solid organ sites: Secondary | ICD-10-CM | POA: Insufficient documentation

## 2012-02-14 DIAGNOSIS — N281 Cyst of kidney, acquired: Secondary | ICD-10-CM | POA: Insufficient documentation

## 2012-02-14 DIAGNOSIS — R599 Enlarged lymph nodes, unspecified: Secondary | ICD-10-CM | POA: Insufficient documentation

## 2012-02-14 DIAGNOSIS — C8589 Other specified types of non-Hodgkin lymphoma, extranodal and solid organ sites: Secondary | ICD-10-CM

## 2012-02-14 DIAGNOSIS — N4289 Other specified disorders of prostate: Secondary | ICD-10-CM | POA: Insufficient documentation

## 2012-02-14 DIAGNOSIS — R05 Cough: Secondary | ICD-10-CM | POA: Insufficient documentation

## 2012-02-14 DIAGNOSIS — R918 Other nonspecific abnormal finding of lung field: Secondary | ICD-10-CM | POA: Insufficient documentation

## 2012-02-14 LAB — COMPREHENSIVE METABOLIC PANEL (CC13)
ALT: 32 U/L (ref 0–55)
Albumin: 3.4 g/dL — ABNORMAL LOW (ref 3.5–5.0)
CO2: 25 mEq/L (ref 22–29)
Chloride: 103 mEq/L (ref 98–107)
Potassium: 3.8 mEq/L (ref 3.5–5.1)
Sodium: 138 mEq/L (ref 136–145)
Total Bilirubin: 0.6 mg/dL (ref 0.20–1.20)
Total Protein: 6.4 g/dL (ref 6.4–8.3)

## 2012-02-14 LAB — CBC WITH DIFFERENTIAL/PLATELET
BASO%: 1.3 % (ref 0.0–2.0)
Eosinophils Absolute: 0.1 10*3/uL (ref 0.0–0.5)
LYMPH%: 21 % (ref 14.0–49.0)
MCHC: 35.4 g/dL (ref 32.0–36.0)
MONO#: 1.3 10*3/uL — ABNORMAL HIGH (ref 0.1–0.9)
NEUT#: 1.7 10*3/uL (ref 1.5–6.5)
RBC: 3.51 10*6/uL — ABNORMAL LOW (ref 4.20–5.82)
RDW: 13.8 % (ref 11.0–14.6)
WBC: 4 10*3/uL (ref 4.0–10.3)
lymph#: 0.8 10*3/uL — ABNORMAL LOW (ref 0.9–3.3)

## 2012-02-14 MED ORDER — IOHEXOL 300 MG/ML  SOLN
100.0000 mL | Freq: Once | INTRAMUSCULAR | Status: AC | PRN
  Administered 2012-02-14: 100 mL via INTRAVENOUS

## 2012-02-16 ENCOUNTER — Ambulatory Visit (HOSPITAL_BASED_OUTPATIENT_CLINIC_OR_DEPARTMENT_OTHER): Payer: BC Managed Care – PPO | Admitting: Oncology

## 2012-02-16 ENCOUNTER — Telehealth: Payer: Self-pay | Admitting: Oncology

## 2012-02-16 VITALS — BP 117/73 | HR 100 | Temp 99.0°F | Wt 260.6 lb

## 2012-02-16 DIAGNOSIS — C8299 Follicular lymphoma, unspecified, extranodal and solid organ sites: Secondary | ICD-10-CM

## 2012-02-16 DIAGNOSIS — I4891 Unspecified atrial fibrillation: Secondary | ICD-10-CM

## 2012-02-16 DIAGNOSIS — C8589 Other specified types of non-Hodgkin lymphoma, extranodal and solid organ sites: Secondary | ICD-10-CM

## 2012-02-16 NOTE — Telephone Encounter (Signed)
apts made and printred for pt aom

## 2012-02-16 NOTE — Progress Notes (Signed)
Hematology and Oncology Follow Up Visit  Tom Fuller [id number] February 24, 1951 61 y.o. 02/16/2012 4:30 PM    CC: Tom Fuller. Tom Ridgel, MD  Tom Fuller, [name]  Tom Perla, [name]: A 61 year old gentleman with stage IIIA low-grade follicular lymphoma diagnosed May 2010.  Past Therapy:  He is S/P Bendamustine and rituximab started on 08/25/11 for 6 cycles completed in 12/2011.  Interim History:  [name] presents today for a followup visit.  This is a pleasant gentleman with follicular lymphoma.  He has disease above and below the diaphragm.  He has cervical adenopathy as well as intra-abdominal adenopathy.  He had bulky adenopathy in the neck that has decreased dramatically since starting chemotherapy. He completed chemotherapy without complications at this time. He denies fever, chest pain, shortness of breath. No vomiting reported at this time. Not taking anti-emetics. Patient still able to work and fatigue has not really affected performance status.  Appetite remains good. No new complaints.  He reports no delayed complications from chemotherapy.   Medications: I have reviewed the patient's current medications. Current outpatient prescriptions:aspirin 325 MG EC tablet, Take 325 mg by mouth daily.  , Disp: , Rfl: ;  celecoxib (CELEBREX) 200 MG capsule, Take 200 mg by mouth daily as needed. , Disp: , Rfl: ;  doxycycline (VIBRA-TABS) 100 MG tablet, Dissolve pill in 5 cc (1 teaspoon) of water ;swish well over lip lesions and then swallow. Avoid direct sun while on this medication, Disp: 20 tablet, Rfl: 0 esomeprazole (NEXIUM) 40 MG capsule, Take 1 capsule (40 mg total) by mouth daily. Office visit due now, Disp: 90 capsule, Rfl: 3;  fish oil-omega-3 fatty acids 1000 MG capsule, Take 2 g by mouth 2 (two) times daily.  , Disp: , Rfl: ;  HYDROcodone-homatropine (HYDROMET) 5-1.5 MG/5ML syrup, Take 5 mLs by mouth every 6 (six) hours as needed for cough., Disp: 120 mL, Rfl: 0 metoprolol  (TOPROL-XL) 50 MG 24 hr tablet, Take 1 tablet (50 mg total) by mouth daily., Disp: 90 tablet, Rfl: 3;  prochlorperazine (COMPAZINE) 10 MG tablet, Take 1 tablet (10 mg total) by mouth every 6 (six) hours as needed., Disp: 30 tablet, Rfl: 1;  valsartan-hydrochlorothiazide (DIOVAN HCT) 320-12.5 MG per tablet, 1 po qd, Disp: 90 tablet, Rfl: 3  Allergies:  Allergies  Allergen Reactions  . Ampicillin     rash    Past Medical History, Surgical history, Social history, and Family History were reviewed and updated.  Review of Systems: Constitutional:  Negative for fever, chills, night sweats, anorexia, weight loss, pain. Cardiovascular: no chest pain or dyspnea on exertion Respiratory: no cough, shortness of breath, or wheezing Neurological: no TIA or stroke symptoms Dermatological: negative ENT: negative Skin: Negative. Gastrointestinal: no abdominal pain, change in bowel habits, or black or bloody stools Genito-Urinary: no dysuria, trouble voiding, or hematuria Hematological and Lymphatic: negative Breast: negative Musculoskeletal: negative Remaining ROS negative.  Physical Exam: Blood pressure 117/73, pulse 100, temperature 99 F (37.2 C), temperature source Oral, weight 260 lb 9 oz (118.19 kg). ECOG: 0 General appearance: alert Head: Normocephalic, without obvious abnormality, atraumatic Neck: no adenopathy, no carotid bruit, no JVD, supple, symmetrical, trachea midline and thyroid not enlarged, symmetric, no tenderness/mass/nodules Lymph nodes: Cervical adenopathy decreased dramatically. Size range between 0.5 to 1 cm now Heart:regular rate and rhythm, S1, S2 normal, no murmur, click, rub or gallop Lung:chest clear, no wheezing, rales, normal symmetric air entry,  Abdomen: soft, non-tender, without masses or organomegaly Skin: noted  erythema and induration in the paraumbilical area.  EXT:no erythema, induration, or nodules  Lab Results: Lab Results  Component Value Date   WBC  4.0 02/14/2012   HGB 11.7* 02/14/2012   HCT 33.1* 02/14/2012   MCV 94.1 02/14/2012   PLT 234 02/14/2012   CT CHEST, ABDOMEN AND PELVIS WITH CONTRAST  Technique: Multidetector CT imaging of the chest, abdomen and  pelvis was performed following the standard protocol during bolus  administration of intravenous contrast.  Contrast: OMNIPAQUE IOHEXOL 300 MG/ML SOLN  Comparison: Multiple exams, including 11/14/2011  CT CHEST  Findings: Right upper paratracheal lymph node short axis 0.7 cm,  formerly 0.8 cm.  Right lower paratracheal lymph node short axis 0.5 cm, stable.  A juxtaesophageal lymph node measures 1.2 cm in short axis on image  21 of series 2 (formerly 1.2 cm). A second juxtaesophageal lymph  node measures 1.2 cm in short axis on image 26 of series 2  (formerly 1.4 cm). A third and juxtaesophageal lymph node measures  0.9 cm on image 30 of series 2 (formerly 1.0 cm).  Cardiac contours unremarkable. No significant aortic abnormality  observed. No pleural effusion identified.  Several small nodules in the right middle lobe and right lower lobe  are stable from 09/01/2009 and considered benign.  IMPRESSION:  1. Mildly pathologically enlarged juxtaesophageal lymph nodes,  minimally reduced from prior exam. Otherwise no findings of  residual/recurrent thoracic involvement.  CT ABDOMEN AND PELVIS  Findings: The liver, spleen, pancreas, and adrenal glands appear  unremarkable.  The gallbladder and biliary system appear unremarkable.  Small left renal cyst appears stable; kidneys otherwise  unremarkable. Mildly reduced mesenteric stranding and nodularity  noted. For example, infiltrative mesenteric density measuring  approximately 7.3 x 3.0 cm previously measured 8.2 x 3.5 cm, and is  slightly reduced in density in addition to size.  Mild retroperitoneal adenopathy observed. An index left periaortic  lesion measures 1.1 cm in short axis (formerly 1.3 cm) a second  similar left  periaortic node measures 1.2 cm in short axis  (formerly 1.5 cm by my measurements). A left common iliac node  measures 1.1 cm in short axis (formerly 1.3 cm). Small external  iliac nodes are again noted.  Urinary bladder unremarkable. Central prostate calcifications  noted. Appendix unremarkable.  IMPRESSION:  1. Mild reduction in adenopathy and infiltrative mesenteric  process related to prior exam.   Impression and Plan: This a pleasant 61 year old gentleman with the following issues: 1. Stage III follicular lymphoma. He is S/P 6 cycles of chemotherapy and tolerated it well overall without any major toxicities. CT scan performed on 9/17 showed good response. Except for his large mesenteric mass, most of his lymph nodes have normalized. For now, we will continue with active surveillance and treat him with chemotherapy with relapse with symptoms.    2. Atrial fibrillation.  He is normal sinus rhythm at this point, followed by cardiologist.   3. Nausea/vomiting prophylaxis. Has compazine at home. This has resolved 4. Follow-up. In 3 months for MD visit and repeat CT scan in 6 months.  5. Port management: he will need a flush every 6 weeks.    Laure Leone 9/19/20134:30 PM

## 2012-02-21 ENCOUNTER — Encounter: Payer: Self-pay | Admitting: Gastroenterology

## 2012-03-29 ENCOUNTER — Telehealth: Payer: Self-pay | Admitting: Oncology

## 2012-03-29 ENCOUNTER — Ambulatory Visit (HOSPITAL_BASED_OUTPATIENT_CLINIC_OR_DEPARTMENT_OTHER): Payer: BC Managed Care – PPO

## 2012-03-29 VITALS — BP 116/74 | HR 97 | Temp 98.2°F

## 2012-03-29 DIAGNOSIS — Z452 Encounter for adjustment and management of vascular access device: Secondary | ICD-10-CM

## 2012-03-29 DIAGNOSIS — C8590 Non-Hodgkin lymphoma, unspecified, unspecified site: Secondary | ICD-10-CM

## 2012-03-29 DIAGNOSIS — C8589 Other specified types of non-Hodgkin lymphoma, extranodal and solid organ sites: Secondary | ICD-10-CM

## 2012-03-29 MED ORDER — SODIUM CHLORIDE 0.9 % IJ SOLN
10.0000 mL | INTRAMUSCULAR | Status: DC | PRN
  Administered 2012-03-29: 10 mL via INTRAVENOUS
  Filled 2012-03-29: qty 10

## 2012-03-29 MED ORDER — HEPARIN SOD (PORK) LOCK FLUSH 100 UNIT/ML IV SOLN
500.0000 [IU] | Freq: Once | INTRAVENOUS | Status: AC
  Administered 2012-03-29: 500 [IU] via INTRAVENOUS
  Filled 2012-03-29: qty 5

## 2012-03-29 NOTE — Patient Instructions (Signed)
Call MD for problems 

## 2012-03-29 NOTE — Telephone Encounter (Signed)
Pt came in to get flush appts added to sch,added and printed for pt aom

## 2012-04-09 ENCOUNTER — Ambulatory Visit (INDEPENDENT_AMBULATORY_CARE_PROVIDER_SITE_OTHER): Payer: BC Managed Care – PPO | Admitting: Family Medicine

## 2012-04-09 ENCOUNTER — Encounter: Payer: Self-pay | Admitting: Family Medicine

## 2012-04-09 VITALS — BP 134/82 | HR 63 | Temp 98.5°F | Wt 258.0 lb

## 2012-04-09 DIAGNOSIS — L03211 Cellulitis of face: Secondary | ICD-10-CM

## 2012-04-09 DIAGNOSIS — L0201 Cutaneous abscess of face: Secondary | ICD-10-CM

## 2012-04-09 MED ORDER — CEFTRIAXONE SODIUM 1 G IJ SOLR
1.0000 g | Freq: Once | INTRAMUSCULAR | Status: AC
  Administered 2012-04-09: 1 g via INTRAMUSCULAR

## 2012-04-09 MED ORDER — SULFAMETHOXAZOLE-TRIMETHOPRIM 800-160 MG PO TABS: ORAL_TABLET | ORAL | Status: DC

## 2012-04-09 NOTE — Addendum Note (Signed)
Addended by: Arnette Norris on: 04/09/2012 04:56 PM   Modules accepted: Orders

## 2012-04-09 NOTE — Patient Instructions (Addendum)
Orbital Cellulitis  Orbital cellulitis is an infection of the soft tissue of the orbit without abscess formation. The eye socket is the area around and behind the eye. It usually comes on suddenly in children, but can happen at any age. This condition can lead to death if untreated.  CAUSES   A germ (bacterial) infection of the area around and behind the eye.  Long-term (chronic) sinus infections.  An object (foreign body) stuck behind the eye.  An injury that goes through (penetrates) to tissues of the eyelids.  Trauma with secondary infection.  Fracture of the boney wall or floor of the orbit.  Serious eyelid infections.  Bite wounds.  Inflammation or infection of the lining membranes of the brain (meningitis).  Blood poisoning or infection (septicemia).  Dental area filled with pus (abscesses).  Severe uncontrolled diabetes (ketoacidosis). SYMPTOMS   Pain in the eye.  Redness and puffiness (swelling) of the eyelids. The swelling is often bad enough that the eye has to close.  Fever and feeling generally ill.  The lids and the cheek may be very red, hot and swollen.  A drop in vision.  Pain when touching the area around the eye.  The eye itself may look like it is "popping out" (proptosis).  Double vision  seeing two of everything (diplopia). DIAGNOSIS  An ophthalmologist can tell you if you have orbital cellulitis during an eye exam. It is important to know if the infection goes into the area behind the eye. True orbital cellulitis is a medical emergency. A CT scan may be needed to see if the sinuses are involved. A CT scan can also see if an abscess has formed behind the eye. TREATMENT  Orbital cellulitis should be treated in the hospital with medicine that kills germs (antibiotics). These antibiotics are given right into the bloodstream through a vein (intravenous). SEEK IMMEDIATE MEDICAL CARE IF:   You have red, swollen eyelids.  You have a fever.  You  develop double vision. MAKE SURE YOU:   Understand these instructions.  Will watch your condition.  Will get help right away if you are not doing well or get worse. Document Released: 05/10/2001 Document Revised: 08/08/2011 Document Reviewed: 09/10/2007 [employer] Patient Information 2013 [employer].  

## 2012-04-09 NOTE — Progress Notes (Signed)
  Subjective:    Patient ID: Tom Fuller, male    DOB: May 12, 1951, 61 y.o.   MRN: [id number]  HPI Pt here c/o swelling R eye and face that started last wed or Thursday.  No fevers.  No pain.   No vision changes per pt.      Review of Systems As above     Objective:   Physical Exam  Constitutional: He appears well-developed and well-nourished.  Eyes:       Swelling R eye and hot to touch with induration under R eye---no drainage.    Cardiovascular: Normal rate and regular rhythm.   Pulmonary/Chest: Effort normal and breath sounds normal.          Assessment & Plan:  Abscess , face  And oribtal cellulitis. ----rocephin given in office,  Bactrim ds sent to pharmacy                                                                    ENT "ASAP"

## 2012-04-19 ENCOUNTER — Other Ambulatory Visit: Payer: Self-pay | Admitting: Family Medicine

## 2012-04-24 ENCOUNTER — Other Ambulatory Visit: Payer: Self-pay | Admitting: Internal Medicine

## 2012-04-24 DIAGNOSIS — I4891 Unspecified atrial fibrillation: Secondary | ICD-10-CM

## 2012-04-24 MED ORDER — METOPROLOL SUCCINATE ER 50 MG PO TB24: 50.0000 mg | ORAL_TABLET | Freq: Every day | ORAL | Status: DC

## 2012-05-14 ENCOUNTER — Telehealth: Payer: Self-pay | Admitting: Family Medicine

## 2012-05-14 NOTE — Telephone Encounter (Signed)
i would need to see it--- if the ent did not think it was the same i need to see it to dx it.  She may not need a referral.

## 2012-05-14 NOTE — Telephone Encounter (Signed)
Wife Joyce Gross calling.  He went to the specialist  for the area on his shoulder.  Thought it was the same as the area on his face but the MD would not see him.  The cream he was given did not help.  States that the area was cx but she is not aware of any dx made.   Wanting to know if he can have another referral?  He has an appointment with his oncologist Friday afternoon ~ 3p.   Please call with appointment time and place.

## 2012-05-15 ENCOUNTER — Encounter (INDEPENDENT_AMBULATORY_CARE_PROVIDER_SITE_OTHER): Payer: Self-pay

## 2012-05-15 ENCOUNTER — Ambulatory Visit (INDEPENDENT_AMBULATORY_CARE_PROVIDER_SITE_OTHER): Payer: BC Managed Care – PPO | Admitting: Surgery

## 2012-05-15 ENCOUNTER — Encounter (INDEPENDENT_AMBULATORY_CARE_PROVIDER_SITE_OTHER): Payer: Self-pay | Admitting: Surgery

## 2012-05-15 ENCOUNTER — Encounter: Payer: Self-pay | Admitting: Family Medicine

## 2012-05-15 ENCOUNTER — Ambulatory Visit (INDEPENDENT_AMBULATORY_CARE_PROVIDER_SITE_OTHER): Payer: BC Managed Care – PPO | Admitting: Family Medicine

## 2012-05-15 VITALS — BP 138/74 | HR 96 | Temp 97.0°F | Resp 18 | Ht 70.0 in | Wt 261.0 lb

## 2012-05-15 VITALS — BP 124/76 | HR 97 | Temp 98.6°F | Wt 263.2 lb

## 2012-05-15 DIAGNOSIS — L02414 Cutaneous abscess of left upper limb: Secondary | ICD-10-CM

## 2012-05-15 DIAGNOSIS — L0291 Cutaneous abscess, unspecified: Secondary | ICD-10-CM

## 2012-05-15 DIAGNOSIS — IMO0002 Reserved for concepts with insufficient information to code with codable children: Secondary | ICD-10-CM

## 2012-05-15 MED ORDER — SULFAMETHOXAZOLE-TRIMETHOPRIM 800-160 MG PO TABS: ORAL_TABLET | ORAL | Status: DC

## 2012-05-15 NOTE — Telephone Encounter (Signed)
msg left to call the office     KP 

## 2012-05-15 NOTE — Patient Instructions (Signed)
Abscess An abscess is an infected area that contains a collection of pus and debris. It can occur in almost any part of the body. An abscess is also known as a furuncle or boil. CAUSES   An abscess occurs when tissue gets infected. This can occur from blockage of oil or sweat glands, infection of hair follicles, or a minor injury to the skin. As the body tries to fight the infection, pus collects in the area and creates pressure under the skin. This pressure causes pain. People with weakened immune systems have difficulty fighting infections and get certain abscesses more often.   SYMPTOMS Usually an abscess develops on the skin and becomes a painful mass that is red, warm, and tender. If the abscess forms under the skin, you may feel a moveable soft area under the skin. Some abscesses break open (rupture) on their own, but most will continue to get worse without care. The infection can spread deeper into the body and eventually into the bloodstream, causing you to feel ill.   DIAGNOSIS   Your caregiver will take your medical history and perform a physical exam. A sample of fluid may also be taken from the abscess to determine what is causing your infection. TREATMENT   Your caregiver may prescribe antibiotic medicines to fight the infection. However, taking antibiotics alone usually does not cure an abscess. Your caregiver may need to make a small cut (incision) in the abscess to drain the pus. In some cases, gauze is packed into the abscess to reduce pain and to continue draining the area. HOME CARE INSTRUCTIONS    Only take over-the-counter or prescription medicines for pain, discomfort, or fever as directed by your caregiver.   If you were prescribed antibiotics, take them as directed. Finish them even if you start to feel better.   If gauze is used, follow your caregiver's directions for changing the gauze.   To avoid spreading the infection:   Keep your draining abscess covered with a  bandage.   Wash your hands well.   Do not share personal care items, towels, or whirlpools with others.   Avoid skin contact with others.   Keep your skin and clothes clean around the abscess.   Keep all follow-up appointments as directed by your caregiver.  SEEK MEDICAL CARE IF:    You have increased pain, swelling, redness, fluid drainage, or bleeding.   You have muscle aches, chills, or a general ill feeling.   You have a fever.  MAKE SURE YOU:    Understand these instructions.   Will watch your condition.   Will get help right away if you are not doing well or get worse.  Document Released: 02/23/2005 Document Revised: 11/15/2011 Document Reviewed: 07/29/2011 [employer] Patient Information 2013 [employer].    

## 2012-05-15 NOTE — Progress Notes (Signed)
  Subjective:    Patient ID: Tom Fuller, male    DOB: 01/26/1951, 61 y.o.   MRN: [id number]  HPI Pt here c/o abscess on L shoulder.  It started out like a sm pimple and enlarged over the last week.      Review of Systems    as above Objective:   Physical Exam  BP 124/76  Pulse 97  Temp 98.6 F (37 C) (Oral)  Wt 263 lb 3.2 oz (119.387 kg)  SpO2 96% General appearance: alert, cooperative, appears stated age and no distress Skin: abscess L shoulder with surrounding errythema             + drainage                   Assessment & Plan:

## 2012-05-15 NOTE — Progress Notes (Signed)
Subjective:     Patient ID: Tom Fuller, male   DOB: Oct 18, 1950, 61 y.o.   MRN: [id number]  HPI patient sent at the request of Dub Amis, DO [address] [facility] [address] [place], [place] [address]  for left shoulder abscess. It has been present for one week. His been red and hot. It has been draining. He has a history of follicular lymphoma which is in remission. It is painful to touch.  Review of Systems  Constitutional: Negative for fever, chills and fatigue.  HENT: Negative.   Respiratory: Negative.   Cardiovascular: Negative.   Psychiatric/Behavioral: Negative.        Objective:   Physical Exam  Constitutional: He is oriented to person, place, and time. He appears well-developed and well-nourished.  HENT:  Head: Normocephalic and atraumatic.  Neurological: He is alert and oriented to person, place, and time.  Skin:          Assessment:     Left shoulder abscess    Plan:     Recommended incision and drainage in the office for local anesthesia. Patient agrees. Using Betadine as prep the left lower abscess was prepped. 1% lidocaine with epinephrine was used for local anesthesia. The abscess was debrided and packed with quarter-inch iodoform packing. Dry dressing applied. Patient tolerated procedure well. He has a prescription for Bactrim to take that. Cultures were sent. Remove packing in 48 hours and shower. Return in 2 weeks or sooner if he has any problem.

## 2012-05-15 NOTE — Patient Instructions (Signed)
Remove packing in 48 hours and shower.  Cover daily with clean dry gauze and change as needed.  Return 2 weeks for recheck or sooner if it gets worse.    Take antibiotics      Abscess An abscess is an infected area that contains a collection of pus and debris.It can occur in almost any part of the body. An abscess is also known as a furuncle or boil. CAUSES  An abscess occurs when tissue gets infected. This can occur from blockage of oil or sweat glands, infection of hair follicles, or a minor injury to the skin. As the body tries to fight the infection, pus collects in the area and creates pressure under the skin. This pressure causes pain. People with weakened immune systems have difficulty fighting infections and get certain abscesses more often.  SYMPTOMS Usually an abscess develops on the skin and becomes a painful mass that is red, warm, and tender. If the abscess forms under the skin, you may feel a moveable soft area under the skin. Some abscesses break open (rupture) on their own, but most will continue to get worse without care. The infection can spread deeper into the body and eventually into the bloodstream, causing you to feel ill.  DIAGNOSIS  Your caregiver will take your medical history and perform a physical exam. A sample of fluid may also be taken from the abscess to determine what is causing your infection. TREATMENT  Your caregiver may prescribe antibiotic medicines to fight the infection. However, taking antibiotics alone usually does not cure an abscess. Your caregiver may need to make a small cut (incision) in the abscess to drain the pus. In some cases, gauze is packed into the abscess to reduce pain and to continue draining the area. HOME CARE INSTRUCTIONS   Only take over-the-counter or prescription medicines for pain, discomfort, or fever as directed by your caregiver.  If you were prescribed antibiotics, take them as directed. Finish them even if you start to feel  better.  If gauze is used, follow your caregiver's directions for changing the gauze.  To avoid spreading the infection:  Keep your draining abscess covered with a bandage.  Wash your hands well.  Do not share personal care items, towels, or whirlpools with others.  Avoid skin contact with others.  Keep your skin and clothes clean around the abscess.  Keep all follow-up appointments as directed by your caregiver. SEEK MEDICAL CARE IF:   You have increased pain, swelling, redness, fluid drainage, or bleeding.  You have muscle aches, chills, or a general ill feeling.  You have a fever. MAKE SURE YOU:   Understand these instructions.  Will watch your condition.  Will get help right away if you are not doing well or get worse. Document Released: 02/23/2005 Document Revised: 11/15/2011 Document Reviewed: 07/29/2011 [facility] Patient Information 2013 [place], [place].

## 2012-05-15 NOTE — Assessment & Plan Note (Signed)
Bactrim for 10 days Culture done Refer to surgery for I&D

## 2012-05-18 ENCOUNTER — Other Ambulatory Visit (HOSPITAL_BASED_OUTPATIENT_CLINIC_OR_DEPARTMENT_OTHER): Payer: BC Managed Care – PPO | Admitting: Lab

## 2012-05-18 ENCOUNTER — Ambulatory Visit: Payer: BC Managed Care – PPO

## 2012-05-18 ENCOUNTER — Other Ambulatory Visit: Payer: BC Managed Care – PPO | Admitting: Lab

## 2012-05-18 ENCOUNTER — Telehealth: Payer: Self-pay | Admitting: Oncology

## 2012-05-18 ENCOUNTER — Ambulatory Visit (HOSPITAL_BASED_OUTPATIENT_CLINIC_OR_DEPARTMENT_OTHER): Payer: BC Managed Care – PPO | Admitting: Oncology

## 2012-05-18 DIAGNOSIS — C8299 Follicular lymphoma, unspecified, extranodal and solid organ sites: Secondary | ICD-10-CM

## 2012-05-18 DIAGNOSIS — C8589 Other specified types of non-Hodgkin lymphoma, extranodal and solid organ sites: Secondary | ICD-10-CM

## 2012-05-18 DIAGNOSIS — C8590 Non-Hodgkin lymphoma, unspecified, unspecified site: Secondary | ICD-10-CM

## 2012-05-18 DIAGNOSIS — I4891 Unspecified atrial fibrillation: Secondary | ICD-10-CM

## 2012-05-18 DIAGNOSIS — C8290 Follicular lymphoma, unspecified, unspecified site: Secondary | ICD-10-CM

## 2012-05-18 LAB — WOUND CULTURE
Gram Stain: NONE SEEN
Gram Stain: NONE SEEN
Gram Stain: NONE SEEN
Gram Stain: NONE SEEN

## 2012-05-18 LAB — CBC WITH DIFFERENTIAL/PLATELET
BASO%: 1 % (ref 0.0–2.0)
Eosinophils Absolute: 0.3 10*3/uL (ref 0.0–0.5)
LYMPH%: 14.1 % (ref 14.0–49.0)
MCHC: 36 g/dL (ref 32.0–36.0)
MONO#: 1 10*3/uL — ABNORMAL HIGH (ref 0.1–0.9)
NEUT#: 3.6 10*3/uL (ref 1.5–6.5)
Platelets: 251 10*3/uL (ref 140–400)
RBC: 3.86 10*6/uL — ABNORMAL LOW (ref 4.20–5.82)
RDW: 13.9 % (ref 11.0–14.6)
WBC: 5.7 10*3/uL (ref 4.0–10.3)
lymph#: 0.8 10*3/uL — ABNORMAL LOW (ref 0.9–3.3)

## 2012-05-18 MED ORDER — HEPARIN SOD (PORK) LOCK FLUSH 100 UNIT/ML IV SOLN
500.0000 [IU] | Freq: Once | INTRAVENOUS | Status: AC
  Administered 2012-05-18: 500 [IU] via INTRAVENOUS
  Filled 2012-05-18: qty 5

## 2012-05-18 MED ORDER — SODIUM CHLORIDE 0.9 % IJ SOLN
10.0000 mL | INTRAMUSCULAR | Status: DC | PRN
  Administered 2012-05-18: 10 mL via INTRAVENOUS
  Filled 2012-05-18: qty 10

## 2012-05-18 NOTE — Progress Notes (Signed)
No blood return from port. Port left accessed and  pt to Dr Alver Fisher RN to attempt lab draw.

## 2012-05-18 NOTE — Progress Notes (Signed)
Hematology and Oncology Follow Up Visit  EZZARD DITMER [id number] 09-10-1950 61 y.o. 05/18/2012 4:40 PM    CC: [name], [name], [name], [name]: A 61 year old gentleman with stage IIIA low-grade follicular lymphoma diagnosed May 2010.  Past Therapy:  He is S/P Bendamustine and rituximab started on 08/25/11 for 6 cycles completed in 12/2011.  Interim History:  Mr. Roskos presents today for a followup visit.  This is a pleasant gentleman with follicular lymphoma.  He has disease above and below the diaphragm.  He has cervical adenopathy as well as intra-abdominal adenopathy.  He had bulky adenopathy in the neck that has decreased dramatically since starting chemotherapy. He completed chemotherapy without complications at this time. He denies fever, chest pain, shortness of breath. No vomiting reported at this time. Not taking anti-emetics. Patient still able to work and fatigue has not really affected performance status.  Appetite remains good. No new complaints.  He had an episode of bronchitis as well as a shoulder abscess that was removed without complications.   Medications: I have reviewed the patient's current medications. Current outpatient prescriptions:aspirin 325 MG EC tablet, Take 325 mg by mouth daily.  , Disp: , Rfl: ;  celecoxib (CELEBREX) 200 MG capsule, Take 200 mg by mouth daily as needed. , Disp: , Rfl: ;  esomeprazole (NEXIUM) 40 MG capsule, Take 1 capsule (40 mg total) by mouth daily before breakfast., Disp: 30 each, Rfl: 2;  fish oil-omega-3 fatty acids 1000 MG capsule, Take 2 g by mouth 2 (two) times daily.  , Disp: , Rfl:  metoprolol succinate (TOPROL-XL) 50 MG 24 hr tablet, Take 1 tablet (50 mg total) by mouth daily., Disp: 90 tablet, Rfl: 0;  sulfamethoxazole-trimethoprim (BACTRIM DS) 800-160 MG per tablet, 1 po bid, Disp: 20 tablet, Rfl: 0;  valsartan-hydrochlorothiazide (DIOVAN HCT) 320-12.5 MG per tablet, 1 po qd, Disp:  90 tablet, Rfl: 3  Allergies:  Allergies  Allergen Reactions  . Ampicillin     rash    Past Medical History, Surgical history, Social history, and Family History were reviewed and updated.  Review of Systems: Constitutional:  Negative for fever, chills, night sweats, anorexia, weight loss, pain. Cardiovascular: no chest pain or dyspnea on exertion Respiratory: no cough, shortness of breath, or wheezing Neurological: no TIA or stroke symptoms Dermatological: negative ENT: negative Skin: Negative. Gastrointestinal: no abdominal pain, change in bowel habits, or black or bloody stools Genito-Urinary: no dysuria, trouble voiding, or hematuria Hematological and Lymphatic: negative Breast: negative Musculoskeletal: negative Remaining ROS negative.  Physical Exam: There were no vitals taken for this visit. ECOG: 0 General appearance: alert Head: Normocephalic, without obvious abnormality, atraumatic Neck: no adenopathy, no carotid bruit, no JVD, supple, symmetrical, trachea midline and thyroid not enlarged, symmetric, no tenderness/mass/nodules Lymph nodes: Cervical adenopathy decreased dramatically. Size range between 0.5 to 1 cm now Heart:regular rate and rhythm, S1, S2 normal, no murmur, click, rub or gallop Lung:chest clear, no wheezing, rales, normal symmetric air entry,  Abdomen: soft, non-tender, without masses or organomegaly Skin: noted erythema and induration in the paraumbilical area.  EXT:no erythema, induration, or nodules  Lab Results: Lab Results  Component Value Date   WBC 5.7 05/18/2012   HGB 13.2 05/18/2012   HCT 36.8* 05/18/2012   MCV 95.2 05/18/2012   PLT 251 05/18/2012     Impression and Plan: This a pleasant 61 year old gentleman with the following issues: 1. Stage III follicular lymphoma. He is  S/P 6 cycles of chemotherapy and tolerated it well overall without any major toxicities. CT scan performed on 9/17 showed good response. Except for his large  mesenteric mass, most of his lymph nodes have normalized. For now, we will continue with active surveillance and treat him with chemotherapy with relapse with symptoms.    2. Atrial fibrillation.  He is normal sinus rhythm at this point, followed by cardiologist.   3. Nausea/vomiting prophylaxis. Has compazine at home. This has resolved 4. Follow-up. In 3 months for MD visit and repeat CT scan at that time.  5. Port management: he will need a flush every 6 weeks.    Nyellie Yetter 12/20/20134:40 PM

## 2012-05-18 NOTE — Telephone Encounter (Signed)
gv and printed appt schedule for pt for March 2014...the patient aware central scheduling will contact with d/t of ct.

## 2012-05-21 LAB — COMPREHENSIVE METABOLIC PANEL
ALT: 41 U/L (ref 0–53)
Albumin: 3.7 g/dL (ref 3.5–5.2)
CO2: 25 mEq/L (ref 19–32)
Calcium: 9.7 mg/dL (ref 8.4–10.5)
Chloride: 103 mEq/L (ref 96–112)
Glucose, Bld: 122 mg/dL — ABNORMAL HIGH (ref 70–99)
Potassium: 3.8 mEq/L (ref 3.5–5.3)
Sodium: 138 mEq/L (ref 135–145)
Total Bilirubin: 0.3 mg/dL (ref 0.3–1.2)
Total Protein: 7 g/dL (ref 6.0–8.3)

## 2012-05-25 ENCOUNTER — Other Ambulatory Visit: Payer: Self-pay | Admitting: Family Medicine

## 2012-05-25 DIAGNOSIS — L0291 Cutaneous abscess, unspecified: Secondary | ICD-10-CM

## 2012-05-25 NOTE — Telephone Encounter (Signed)
Refill for bactrim sent to pharmacy, pt notified

## 2012-06-01 ENCOUNTER — Encounter (INDEPENDENT_AMBULATORY_CARE_PROVIDER_SITE_OTHER): Payer: Self-pay | Admitting: Surgery

## 2012-06-01 ENCOUNTER — Ambulatory Visit (INDEPENDENT_AMBULATORY_CARE_PROVIDER_SITE_OTHER): Payer: Managed Care, Other (non HMO) | Admitting: Surgery

## 2012-06-01 VITALS — BP 126/82 | HR 76 | Temp 96.6°F | Resp 16 | Ht 70.0 in | Wt 267.4 lb

## 2012-06-01 DIAGNOSIS — Z5189 Encounter for other specified aftercare: Secondary | ICD-10-CM

## 2012-06-01 NOTE — Patient Instructions (Signed)
Cover with dry gauze and apply Neosporin to wound daily. Once wound dries up, may place a Band-Aid. Once cover the skin may stop dressing changes. Okay to continue washing and showering. Finish Bactrim. Return as needed.

## 2012-06-01 NOTE — Progress Notes (Signed)
Subjective:     Patient ID: Tom Fuller, male   DOB: July 23, 1950, 62 y.o.   MRN: [id number]  HPI patient returns in followup of left shoulder abscess and wound. He is doing well. He is completing Bactrim. No fever or chills. Minimal pain.   Review of Systems  Constitutional: Negative.   Respiratory: Negative.   Cardiovascular: Negative.        Objective:   Physical Exam  Constitutional: He appears well-developed and well-nourished.  HENT:  Head: Normocephalic and atraumatic.  Skin:          Assessment:     Open wound left shoulder status post incision drainage and debridement of left shoulder abscess    Plan:     Wound care suction was given. Return as needed.

## 2012-06-22 ENCOUNTER — Other Ambulatory Visit: Payer: Self-pay | Admitting: Cardiology

## 2012-06-22 DIAGNOSIS — I10 Essential (primary) hypertension: Secondary | ICD-10-CM

## 2012-06-22 MED ORDER — VALSARTAN-HYDROCHLOROTHIAZIDE 320-12.5 MG PO TABS: ORAL_TABLET | ORAL | Status: DC

## 2012-06-29 ENCOUNTER — Ambulatory Visit (HOSPITAL_BASED_OUTPATIENT_CLINIC_OR_DEPARTMENT_OTHER): Payer: Managed Care, Other (non HMO)

## 2012-06-29 VITALS — BP 138/90 | HR 108 | Temp 98.4°F

## 2012-06-29 DIAGNOSIS — C8590 Non-Hodgkin lymphoma, unspecified, unspecified site: Secondary | ICD-10-CM

## 2012-06-29 DIAGNOSIS — Z452 Encounter for adjustment and management of vascular access device: Secondary | ICD-10-CM

## 2012-06-29 DIAGNOSIS — C8589 Other specified types of non-Hodgkin lymphoma, extranodal and solid organ sites: Secondary | ICD-10-CM

## 2012-06-29 MED ORDER — SODIUM CHLORIDE 0.9 % IJ SOLN
10.0000 mL | INTRAMUSCULAR | Status: DC | PRN
  Administered 2012-06-29: 10 mL via INTRAVENOUS
  Filled 2012-06-29: qty 10

## 2012-06-29 MED ORDER — HEPARIN SOD (PORK) LOCK FLUSH 100 UNIT/ML IV SOLN
500.0000 [IU] | Freq: Once | INTRAVENOUS | Status: AC
  Administered 2012-06-29: 500 [IU] via INTRAVENOUS
  Filled 2012-06-29: qty 5

## 2012-06-29 NOTE — Progress Notes (Signed)
No blood return from port, flushed without difficulty.

## 2012-06-30 ENCOUNTER — Telehealth: Payer: Self-pay | Admitting: Oncology

## 2012-06-30 NOTE — Telephone Encounter (Signed)
Moved 3/21 f/u appt to AM. S/w pt he is aware.

## 2012-07-19 ENCOUNTER — Other Ambulatory Visit: Payer: Self-pay | Admitting: Family Medicine

## 2012-07-23 ENCOUNTER — Other Ambulatory Visit: Payer: Self-pay | Admitting: Cardiology

## 2012-07-23 DIAGNOSIS — I4891 Unspecified atrial fibrillation: Secondary | ICD-10-CM

## 2012-07-23 MED ORDER — METOPROLOL SUCCINATE ER 50 MG PO TB24: 50.0000 mg | ORAL_TABLET | Freq: Every day | ORAL | Status: DC

## 2012-08-10 ENCOUNTER — Ambulatory Visit (HOSPITAL_COMMUNITY)
Admission: RE | Admit: 2012-08-10 | Discharge: 2012-08-10 | Disposition: A | Discharge status: Home / Self Care | Payer: Managed Care, Other (non HMO) | Source: Ambulatory Visit | Attending: Oncology | Admitting: Oncology

## 2012-08-10 ENCOUNTER — Ambulatory Visit: Payer: Managed Care, Other (non HMO)

## 2012-08-10 ENCOUNTER — Other Ambulatory Visit (HOSPITAL_BASED_OUTPATIENT_CLINIC_OR_DEPARTMENT_OTHER): Payer: Managed Care, Other (non HMO) | Admitting: Lab

## 2012-08-10 VITALS — BP 132/83 | HR 81 | Temp 98.0°F | Resp 20

## 2012-08-10 DIAGNOSIS — C8590 Non-Hodgkin lymphoma, unspecified, unspecified site: Secondary | ICD-10-CM

## 2012-08-10 DIAGNOSIS — C8589 Other specified types of non-Hodgkin lymphoma, extranodal and solid organ sites: Secondary | ICD-10-CM | POA: Insufficient documentation

## 2012-08-10 DIAGNOSIS — M47812 Spondylosis without myelopathy or radiculopathy, cervical region: Secondary | ICD-10-CM | POA: Insufficient documentation

## 2012-08-10 DIAGNOSIS — R599 Enlarged lymph nodes, unspecified: Secondary | ICD-10-CM | POA: Insufficient documentation

## 2012-08-10 DIAGNOSIS — C8290 Follicular lymphoma, unspecified, unspecified site: Secondary | ICD-10-CM

## 2012-08-10 DIAGNOSIS — R911 Solitary pulmonary nodule: Secondary | ICD-10-CM | POA: Insufficient documentation

## 2012-08-10 DIAGNOSIS — C8299 Follicular lymphoma, unspecified, extranodal and solid organ sites: Secondary | ICD-10-CM

## 2012-08-10 LAB — CBC WITH DIFFERENTIAL/PLATELET
BASO%: 1.1 % (ref 0.0–2.0)
Basophils Absolute: 0.1 10*3/uL (ref 0.0–0.1)
HCT: 37.8 % — ABNORMAL LOW (ref 38.4–49.9)
HGB: 13.2 g/dL (ref 13.0–17.1)
MCHC: 34.9 g/dL (ref 32.0–36.0)
MONO#: 0.8 10*3/uL (ref 0.1–0.9)
NEUT%: 65 % (ref 39.0–75.0)
RDW: 13.9 % (ref 11.0–14.6)
WBC: 6.4 10*3/uL (ref 4.0–10.3)
lymph#: 1.1 10*3/uL (ref 0.9–3.3)

## 2012-08-10 LAB — COMPREHENSIVE METABOLIC PANEL (CC13)
ALT: 69 U/L — ABNORMAL HIGH (ref 0–55)
Albumin: 3.8 g/dL (ref 3.5–5.0)
CO2: 28 mEq/L (ref 22–29)
Calcium: 9.3 mg/dL (ref 8.4–10.4)
Chloride: 104 mEq/L (ref 98–107)
Creatinine: 1 mg/dL (ref 0.7–1.3)
Potassium: 4 mEq/L (ref 3.5–5.1)

## 2012-08-10 MED ORDER — HEPARIN SOD (PORK) LOCK FLUSH 100 UNIT/ML IV SOLN
500.0000 [IU] | Freq: Once | INTRAVENOUS | Status: AC
  Administered 2012-08-10: 500 [IU] via INTRAVENOUS
  Filled 2012-08-10: qty 5

## 2012-08-10 MED ORDER — SODIUM CHLORIDE 0.9 % IJ SOLN
10.0000 mL | INTRAMUSCULAR | Status: DC | PRN
  Administered 2012-08-10: 10 mL via INTRAVENOUS
  Filled 2012-08-10: qty 10

## 2012-08-10 MED ORDER — IOHEXOL 300 MG/ML  SOLN
100.0000 mL | Freq: Once | INTRAMUSCULAR | Status: AC | PRN
  Administered 2012-08-10: 100 mL via INTRAVENOUS

## 2012-08-10 NOTE — Patient Instructions (Signed)
Patient to CT lab

## 2012-08-10 NOTE — Progress Notes (Signed)
Patient for CT scan today.  Patient had labs drawn prior to arrival to flush room. Unable to get blood return from power port after accessing patient twice  Port  Flushes easily .Talked with [facility]  CT lab and they instructed to send patient over to CT accessed.

## 2012-08-17 ENCOUNTER — Telehealth: Payer: Self-pay | Admitting: Oncology

## 2012-08-17 ENCOUNTER — Ambulatory Visit (HOSPITAL_BASED_OUTPATIENT_CLINIC_OR_DEPARTMENT_OTHER): Payer: Managed Care, Other (non HMO) | Admitting: Oncology

## 2012-08-17 VITALS — BP 130/82 | HR 85 | Temp 97.7°F | Resp 18 | Ht 70.0 in | Wt 263.7 lb

## 2012-08-17 DIAGNOSIS — C8299 Follicular lymphoma, unspecified, extranodal and solid organ sites: Secondary | ICD-10-CM

## 2012-08-17 DIAGNOSIS — C8589 Other specified types of non-Hodgkin lymphoma, extranodal and solid organ sites: Secondary | ICD-10-CM

## 2012-08-17 NOTE — Telephone Encounter (Signed)
gv and printed appt schedule for pt for May, June and July

## 2012-08-17 NOTE — Progress Notes (Signed)
Hematology and Oncology Follow Up Visit  Tom Fuller [id number] [birth year] 62 y.o. 08/17/2012 11:05 AM    CC: [name], [name], [name], [name]: A 62 year old gentleman with stage IIIA low-grade follicular lymphoma diagnosed May 2010.  Past Therapy:  He is S/P Bendamustine and rituximab started on 08/25/11 for 6 cycles completed in 12/2011.  Interim History:  Mr. Larue presents today for a followup visit.  This is a pleasant gentleman with follicular lymphoma.  He has disease above and below the diaphragm.  He has cervical adenopathy as well as intra-abdominal adenopathy.  He had bulky adenopathy in the neck that has decreased dramatically since starting chemotherapy. He completed chemotherapy without complications at this time. He denies fever, chest pain, shortness of breath. No vomiting reported at this time. Not taking anti-emetics. Patient still able to work and fatigue has not really affected performance status.  Appetite remains good. No new complaints. He has been working full time without any decline.    Medications: I have reviewed the patient's current medications. Current outpatient prescriptions:aspirin 325 MG EC tablet, Take 325 mg by mouth daily.  , Disp: , Rfl: ;  celecoxib (CELEBREX) 200 MG capsule, Take 200 mg by mouth daily as needed. , Disp: , Rfl: ;  fish oil-omega-3 fatty acids 1000 MG capsule, Take 2 g by mouth 2 (two) times daily.  , Disp: , Rfl: ;  metoprolol succinate (TOPROL-XL) 50 MG 24 hr tablet, Take 1 tablet (50 mg total) by mouth daily., Disp: 30 tablet, Rfl: 0 NEXIUM 40 MG capsule, take 1 capsule by mouth once daily before BREAKFAST, Disp: 30 capsule, Rfl: 5;  valsartan-hydrochlorothiazide (DIOVAN HCT) 320-12.5 MG per tablet, 1 po qd, Disp: 30 tablet, Rfl: 1  Allergies:  Allergies  Allergen Reactions  . Ampicillin     rash    Past Medical History, Surgical history, Social history, and Family History were  reviewed and updated.  Review of Systems: Constitutional:  Negative for fever, chills, night sweats, anorexia, weight loss, pain. Cardiovascular: no chest pain or dyspnea on exertion Respiratory: no cough, shortness of breath, or wheezing Neurological: no TIA or stroke symptoms Dermatological: negative ENT: negative Skin: Negative. Gastrointestinal: no abdominal pain, change in bowel habits, or black or bloody stools Genito-Urinary: no dysuria, trouble voiding, or hematuria Hematological and Lymphatic: negative Breast: negative Musculoskeletal: negative Remaining ROS negative.  Physical Exam: Blood pressure 130/82, pulse 85, temperature 97.7 F (36.5 C), temperature source Oral, resp. rate 18, height 5\' 10"  (1.778 m), weight 263 lb 11.2 oz (119.614 kg). ECOG: 0 General appearance: alert Head: Normocephalic, without obvious abnormality, atraumatic Neck: no adenopathy, no carotid bruit, no JVD, supple, symmetrical, trachea midline and thyroid not enlarged, symmetric, no tenderness/mass/nodules Lymph nodes: Cervical adenopathy decreased dramatically. Size range between 0.5 to 1 cm now Heart:regular rate and rhythm, S1, S2 normal, no murmur, click, rub or gallop Lung:chest clear, no wheezing, rales, normal symmetric air entry,  Abdomen: soft, non-tender, without masses or organomegaly Skin: noted erythema and induration in the paraumbilical area.  EXT:no erythema, induration, or nodules  Lab Results: Lab Results  Component Value Date   WBC 6.4 08/10/2012   HGB 13.2 08/10/2012   HCT 37.8* 08/10/2012   MCV 95.2 08/10/2012   PLT 243 08/10/2012   CT CHEST, ABDOMEN AND PELVIS WITH CONTRAST  Technique: Multidetector CT imaging of the chest, abdomen and  pelvis was performed following the standard protocol during bolus  administration of intravenous contrast.  Contrast: OMNIPAQUE IOHEXOL 300 MG/ML SOLN  Comparison: CT 02/14/2012  CT CHEST  Findings: There is a port in the right  anterior chest wall. No  axillary or supraclavicular lymphadenopathy. No mediastinal or  hilar lymphadenopathy. The small mediastinal lymph nodes are less  than 1 cm and not pathologic. Lymph nodes along the esophagus  inferior to the carina and decreased in prominence. For example 11  mm lymph node (image 25) decreased from 12 mm on prior.  Review of the lung parenchyma demonstrates scattered small  pulmonary nodules which are not increased in size or number  compared to prior.  IMPRESSION:  1.  1. Decrease in small paraesophageal lymph nodes positioned between  the esophagus and the aorta inferior to the carina.  2. Scattered small pulmonary nodules are unchanged.  3. No evidence of disease progression.  CT ABDOMEN AND PELVIS  Findings: No focal hepatic lesion. The pancreas and gallbladder  normal. The spleen is normal volume. Adrenal glands kidneys are  normal. The stomach, small bowel, and colon unremarkable.  Central hazy mesentery has decreased in the mass-like component.  For example ill-defined central mesenteric thickening measures 3.8  x 2.3 cm compared to 6.4 x 2.9 cm on prior ( remeasured) .  Likewise retroperitoneal lymph nodes have decreased slightly in  volume. For example, left periaortic lymph node measures 9 mm  short axis (image 70) compared to 10 mm on prior. Aortocaval lymph  node (image 68) measures 10 mm short axis similar to 10 mm on  prior. No new adenopathy.  In the pelvis, small external iliac lymph nodes less than 1 cm and  unchanged. Left common iliac lymph node measures 8 mm (11 mm on  prior). No new adenopathy.  The prostate gland and bladder are normal.  No aggressive osseous lesions.  IMPRESSION:  1. Mild continued improvement in lymphadenopathy within the  abdomen and pelvis.  2. Specifically, the central mesenteric masses is less dense than  prior. Small retroperitoneal lymph nodes and pelvic lymph nodes  are slightly decreased in size.  3.  Normal splenic volume. No bone metastasis.    Impression and Plan: This a pleasant 62 year old gentleman with the following issues: 1. Stage III follicular lymphoma. He is S/P 6 cycles of chemotherapy and tolerated it well overall without any major toxicities. CT scan performed on 3/14 and was discussed today. His scan showed excellent response. Except for his large mesenteric mass (which is shrinking as well), most of his lymph nodes have normalized. For now, we will continue with active surveillance and treat him with chemotherapy with relapse with symptoms.    2. Atrial fibrillation.  He is normal sinus rhythm at this point, followed by cardiologist.   3. Nausea/vomiting prophylaxis. Has compazine at home. This has resolved 4. Follow-up. In 3 months for MD visit and repeat CT scan in 6 months.  5. Port management: he will need a flush every 6 weeks.    Ahtziry Saathoff 3/21/201411:05 AM

## 2012-08-20 ENCOUNTER — Other Ambulatory Visit: Payer: Self-pay | Admitting: Internal Medicine

## 2012-08-23 ENCOUNTER — Other Ambulatory Visit: Payer: Self-pay | Admitting: Internal Medicine

## 2012-08-27 ENCOUNTER — Telehealth: Payer: Self-pay | Admitting: *Deleted

## 2012-08-27 NOTE — Telephone Encounter (Signed)
Prior Auth approved 08-24-12 until 08-25-15, approval letter scan to chart.

## 2012-08-31 ENCOUNTER — Encounter: Payer: Self-pay | Admitting: Family Medicine

## 2012-09-19 ENCOUNTER — Encounter: Payer: Self-pay | Admitting: Physician Assistant

## 2012-09-19 ENCOUNTER — Encounter: Payer: Self-pay | Admitting: *Deleted

## 2012-09-19 ENCOUNTER — Ambulatory Visit (INDEPENDENT_AMBULATORY_CARE_PROVIDER_SITE_OTHER): Payer: Managed Care, Other (non HMO) | Admitting: Physician Assistant

## 2012-09-19 VITALS — BP 129/88 | HR 77 | Ht 71.0 in | Wt 267.0 lb

## 2012-09-19 DIAGNOSIS — I10 Essential (primary) hypertension: Secondary | ICD-10-CM

## 2012-09-19 DIAGNOSIS — I4891 Unspecified atrial fibrillation: Secondary | ICD-10-CM

## 2012-09-19 MED ORDER — METOPROLOL SUCCINATE ER 50 MG PO TB24: 50.0000 mg | ORAL_TABLET | Freq: Every day | ORAL | Status: DC

## 2012-09-19 NOTE — Patient Instructions (Addendum)
Your physician wants you to follow-up in: 6 MONTHS WITH DR. [name]. You will receive a reminder letter in the mail two months in advance. If you don't receive a letter, please call our office to schedule the follow-up appointment.   A REFILL FOR METOPROLOL WAS SENT IN TODAY

## 2012-09-19 NOTE — Progress Notes (Signed)
[address] [place], [place]  [address] Phone: [phone] Fax:  [phone]  Date:  09/19/2012   ID:  Tom Fuller, DOB 1950-06-05, MRN [id number]  PCP:  Loreen Freud, DO  Primary Cardiologist/Electrophysiologist:  Dr. Lewayne Bunting    History of Present Illness: Tom Fuller is a 62 y.o. male who returns for f/u.  He has a hx of permanent atrial fibrillation, HTN, chronic LE edema, low-grade follicular lymphoma which is in remission.  Echo 09/2008: EF 50-55%, mild LAE. Last seen by Dr. [name] 03/2011.  He is doing well.  The patient denies chest pain, shortness of breath, syncope, orthopnea, PND or significant pedal edema.   Labs (3/14):  K 4, Cr 1.0, ALT 69, Hgb 13.2, TSH 1.69  Wt Readings from Last 3 Encounters:  08/17/12 263 lb 11.2 oz (119.614 kg)  06/01/12 267 lb 6.4 oz (121.292 kg)  05/15/12 261 lb (118.389 kg)     Past Medical History  Diagnosis Date  . Atrial fibrillation   . Lymphoma, follicular     Dr.Shadad  . Cervical lymphadenopathy   . GERD (gastroesophageal reflux disease)   . Erectile dysfunction   . Hypertension     Current Outpatient Prescriptions  Medication Sig Dispense Refill  . aspirin 325 MG EC tablet Take 325 mg by mouth daily.        . celecoxib (CELEBREX) 200 MG capsule Take 200 mg by mouth daily as needed.       . fish oil-omega-3 fatty acids 1000 MG capsule Take 2 g by mouth 2 (two) times daily.        . metoprolol succinate (TOPROL-XL) 50 MG 24 hr tablet Take 1 tablet (50 mg total) by mouth daily.  30 tablet  0  . NEXIUM 40 MG capsule take 1 capsule by mouth once daily before BREAKFAST  30 capsule  5  . valsartan-hydrochlorothiazide (DIOVAN-HCT) 320-12.5 MG per tablet TAKE 1 TABLET EVERY DAY  30 tablet  0   No current facility-administered medications for this visit.    Allergies:    Allergies  Allergen Reactions  . Ampicillin     rash    Social History:  The patient  reports that he has never smoked. He has  never used smokeless tobacco. He reports that he does not drink alcohol or use illicit drugs.   ROS:  Please see the history of present illness.   He notes a hx of snoring.  No daytime hypersomnolence.   All other systems reviewed and negative.   PHYSICAL EXAM: VS:  BP 129/88  Pulse 77  Ht 5\' 11"  (1.803 m)  Wt 267 lb (121.11 kg)  BMI 37.26 kg/m2 Well nourished, well developed, in no acute distress HEENT: normal Neck: no JVD Cardiac:  normal S1, S2; irregularly irregular rhythm; no murmur Lungs:  clear to auscultation bilaterally, no wheezing, rhonchi or rales Abd: soft, nontender, no hepatomegaly Ext: no edema Skin: warm and dry Neuro:  CNs 2-12 intact, no focal abnormalities noted  EKG:  AFib, HR 77     ASSESSMENT AND PLAN:  1. Atrial Fibrillation:  Rate is controlled.  CHADS2=1.  D/w Dr. Lewayne Bunting.  Will continue ASA only for now unless he develops glucose intolerance.  No FHx of stroke.  Continue Toprol.   2. Hypertension:  Controlled.  Continue current therapy.  3. Lymphoma:  He is followed by oncology.  4. Snoring:  With AFib and snoring, I suspect he has sleep apnea.  We discussed sleep testing.  He prefers to hold off on this for now.  5. Disposition:  F/u with Dr. Lewayne Bunting in 6 mos.   Signed, Tereso Newcomer, PA-C  2:06 PM 09/19/2012

## 2012-09-28 ENCOUNTER — Ambulatory Visit (HOSPITAL_BASED_OUTPATIENT_CLINIC_OR_DEPARTMENT_OTHER): Payer: Managed Care, Other (non HMO)

## 2012-09-28 VITALS — BP 125/82 | HR 100 | Temp 97.0°F

## 2012-09-28 DIAGNOSIS — C8589 Other specified types of non-Hodgkin lymphoma, extranodal and solid organ sites: Secondary | ICD-10-CM

## 2012-09-28 DIAGNOSIS — Z452 Encounter for adjustment and management of vascular access device: Secondary | ICD-10-CM

## 2012-09-28 DIAGNOSIS — C8590 Non-Hodgkin lymphoma, unspecified, unspecified site: Secondary | ICD-10-CM

## 2012-09-28 MED ORDER — HEPARIN SOD (PORK) LOCK FLUSH 100 UNIT/ML IV SOLN
500.0000 [IU] | Freq: Once | INTRAVENOUS | Status: AC
  Administered 2012-09-28: 500 [IU] via INTRAVENOUS
  Filled 2012-09-28: qty 5

## 2012-09-28 MED ORDER — SODIUM CHLORIDE 0.9 % IJ SOLN
10.0000 mL | INTRAMUSCULAR | Status: DC | PRN
  Administered 2012-09-28: 10 mL via INTRAVENOUS
  Filled 2012-09-28: qty 10

## 2012-09-28 NOTE — Patient Instructions (Addendum)
Call MD for problems or concerns 

## 2012-11-09 ENCOUNTER — Telehealth: Payer: Self-pay | Admitting: Oncology

## 2012-11-09 ENCOUNTER — Ambulatory Visit (HOSPITAL_BASED_OUTPATIENT_CLINIC_OR_DEPARTMENT_OTHER): Payer: Managed Care, Other (non HMO)

## 2012-11-09 ENCOUNTER — Ambulatory Visit (HOSPITAL_BASED_OUTPATIENT_CLINIC_OR_DEPARTMENT_OTHER): Payer: Managed Care, Other (non HMO) | Admitting: Oncology

## 2012-11-09 ENCOUNTER — Other Ambulatory Visit (HOSPITAL_BASED_OUTPATIENT_CLINIC_OR_DEPARTMENT_OTHER): Payer: Managed Care, Other (non HMO) | Admitting: Lab

## 2012-11-09 VITALS — BP 129/77 | HR 92 | Temp 97.9°F | Resp 18 | Ht 71.0 in | Wt 272.0 lb

## 2012-11-09 DIAGNOSIS — C8589 Other specified types of non-Hodgkin lymphoma, extranodal and solid organ sites: Secondary | ICD-10-CM

## 2012-11-09 DIAGNOSIS — C8590 Non-Hodgkin lymphoma, unspecified, unspecified site: Secondary | ICD-10-CM

## 2012-11-09 LAB — CBC WITH DIFFERENTIAL/PLATELET
Basophils Absolute: 0.1 10*3/uL (ref 0.0–0.1)
Eosinophils Absolute: 0.3 10*3/uL (ref 0.0–0.5)
HCT: 37 % — ABNORMAL LOW (ref 38.4–49.9)
HGB: 13.2 g/dL (ref 13.0–17.1)
LYMPH%: 23 % (ref 14.0–49.0)
MONO#: 0.7 10*3/uL (ref 0.1–0.9)
NEUT%: 60.3 % (ref 39.0–75.0)
Platelets: 219 10*3/uL (ref 140–400)
WBC: 6.7 10*3/uL (ref 4.0–10.3)
lymph#: 1.5 10*3/uL (ref 0.9–3.3)

## 2012-11-09 LAB — COMPREHENSIVE METABOLIC PANEL (CC13)
ALT: 69 U/L — ABNORMAL HIGH (ref 0–55)
BUN: 19.2 mg/dL (ref 7.0–26.0)
CO2: 27 mEq/L (ref 22–29)
Calcium: 9.4 mg/dL (ref 8.4–10.4)
Chloride: 107 mEq/L (ref 98–107)
Creatinine: 0.9 mg/dL (ref 0.7–1.3)
Glucose: 120 mg/dl — ABNORMAL HIGH (ref 70–99)

## 2012-11-09 MED ORDER — SODIUM CHLORIDE 0.9 % IJ SOLN
10.0000 mL | INTRAMUSCULAR | Status: DC | PRN
  Administered 2012-11-09: 10 mL via INTRAVENOUS
  Filled 2012-11-09: qty 10

## 2012-11-09 MED ORDER — HEPARIN SOD (PORK) LOCK FLUSH 100 UNIT/ML IV SOLN
500.0000 [IU] | Freq: Once | INTRAVENOUS | Status: AC
  Administered 2012-11-09: 500 [IU] via INTRAVENOUS
  Filled 2012-11-09: qty 5

## 2012-11-09 NOTE — Progress Notes (Signed)
Hematology and Oncology Follow Up Visit  Tom Fuller [id number] 15-Nov-1950 61 y.o. 11/09/2012 4:04 PM    CC: [name], [name], [name], [name]: A 62 year old gentleman with stage IIIA low-grade follicular lymphoma diagnosed May 2010.  Past Therapy:  He is S/P Bendamustine and rituximab started on 08/25/11 for 6 cycles completed in 12/2011.  Interim History:  Mr. Stickler presents today for a followup visit.  This is a pleasant gentleman with follicular lymphoma.  He has disease above and below the diaphragm.  He has cervical adenopathy as well as intra-abdominal adenopathy.  He had bulky adenopathy in the neck that has decreased dramatically since starting chemotherapy. He completed chemotherapy without complications at this time. He denies fever, chest pain, shortness of breath. No vomiting reported at this time. Patient still able to work and fatigue has not really affected performance status.  Appetite remains good. No new complaints. He has been working full time without any decline. No recent illness or hospitalizations.   Medications: I have reviewed the patient's current medications.  Current Outpatient Prescriptions  Medication Sig Dispense Refill  . aspirin 325 MG EC tablet Take 325 mg by mouth daily.        . celecoxib (CELEBREX) 200 MG capsule Take 200 mg by mouth daily as needed.       . fish oil-omega-3 fatty acids 1000 MG capsule Take 2 g by mouth 2 (two) times daily.        . metoprolol succinate (TOPROL-XL) 50 MG 24 hr tablet Take 1 tablet (50 mg total) by mouth daily.  90 tablet  3  . NEXIUM 40 MG capsule take 1 capsule by mouth once daily before BREAKFAST  30 capsule  5  . valsartan-hydrochlorothiazide (DIOVAN-HCT) 320-12.5 MG per tablet TAKE 1 TABLET EVERY DAY  30 tablet  0   No current facility-administered medications for this visit.    Allergies:  Allergies  Allergen Reactions  . Ampicillin     rash    Past  Medical History, Surgical history, Social history, and Family History were reviewed and updated.  Review of Systems: Constitutional:  Negative for fever, chills, night sweats, anorexia, weight loss, pain. Cardiovascular: no chest pain or dyspnea on exertion Respiratory: no cough, shortness of breath, or wheezing Neurological: no TIA or stroke symptoms Dermatological: negative ENT: negative Skin: Negative. Gastrointestinal: no abdominal pain, change in bowel habits, or black or bloody stools Genito-Urinary: no dysuria, trouble voiding, or hematuria Hematological and Lymphatic: negative Breast: negative Musculoskeletal: negative Remaining ROS negative.  Physical Exam: Blood pressure 129/77, pulse 92, temperature 97.9 F (36.6 C), temperature source Oral, resp. rate 18. ECOG: 0 General appearance: alert Head: Normocephalic, without obvious abnormality, atraumatic Neck: no adenopathy, no carotid bruit, no JVD, supple, symmetrical, trachea midline and thyroid not enlarged, symmetric, no tenderness/mass/nodules Lymph nodes: Cervical adenopathy decreased dramatically. Size range between 0.5 to 1 cm now Heart:regular rate and rhythm, S1, S2 normal, no murmur, click, rub or gallop Lung:chest clear, no wheezing, rales, normal symmetric air entry,  Abdomen: soft, non-tender, without masses or organomegaly Skin: noted erythema and induration in the paraumbilical area.  EXT:no erythema, induration, or nodules  Lab Results: Lab Results  Component Value Date   WBC 6.7 11/09/2012   HGB 13.2 11/09/2012   HCT 37.0* 11/09/2012   MCV 93.8 11/09/2012   PLT 219 11/09/2012     Impression and Plan: This a pleasant 62 year old gentleman with the  following issues: 1. Stage III follicular lymphoma. He is S/P 6 cycles of chemotherapy and tolerated it well overall without any major toxicities. CT scan performed on 08/10/2012  showed excellent response. Except for his large mesenteric mass (which is  shrinking as well), most of his lymph nodes have normalized. For now, we will continue with active surveillance and treat him with chemotherapy with relapse with symptoms.    2. Atrial fibrillation.  He is normal sinus rhythm at this point, followed by cardiologist.   3. Nausea/vomiting prophylaxis. Has compazine at home. This has resolved 4. Follow-up. In 3 months for MD visit and repeat CT scan in 3 months.  5. Port management: he will need a flush every 6 weeks.    [facility] 6/13/20144:04 PM

## 2012-11-09 NOTE — Patient Instructions (Signed)
Patient to Dr. Clelia Croft

## 2012-11-09 NOTE — Telephone Encounter (Signed)
gv and printed appt sched and avs for pt...gv pt Barium    °

## 2012-11-16 NOTE — Telephone Encounter (Signed)
e

## 2012-11-18 ENCOUNTER — Other Ambulatory Visit: Payer: Self-pay | Admitting: Family Medicine

## 2012-11-18 ENCOUNTER — Other Ambulatory Visit: Payer: Self-pay | Admitting: Internal Medicine

## 2012-11-19 ENCOUNTER — Other Ambulatory Visit: Payer: Self-pay | Admitting: Family Medicine

## 2012-12-06 ENCOUNTER — Other Ambulatory Visit: Payer: Self-pay | Admitting: Family Medicine

## 2012-12-21 ENCOUNTER — Ambulatory Visit (HOSPITAL_BASED_OUTPATIENT_CLINIC_OR_DEPARTMENT_OTHER): Payer: Managed Care, Other (non HMO)

## 2012-12-21 VITALS — BP 148/90 | HR 94 | Temp 98.6°F

## 2012-12-21 DIAGNOSIS — C8590 Non-Hodgkin lymphoma, unspecified, unspecified site: Secondary | ICD-10-CM

## 2012-12-21 DIAGNOSIS — C8589 Other specified types of non-Hodgkin lymphoma, extranodal and solid organ sites: Secondary | ICD-10-CM

## 2012-12-21 DIAGNOSIS — Z452 Encounter for adjustment and management of vascular access device: Secondary | ICD-10-CM

## 2012-12-21 MED ORDER — SODIUM CHLORIDE 0.9 % IJ SOLN
10.0000 mL | INTRAMUSCULAR | Status: DC | PRN
  Administered 2012-12-21: 10 mL via INTRAVENOUS
  Filled 2012-12-21: qty 10

## 2012-12-21 MED ORDER — HEPARIN SOD (PORK) LOCK FLUSH 100 UNIT/ML IV SOLN
500.0000 [IU] | Freq: Once | INTRAVENOUS | Status: AC
  Administered 2012-12-21: 500 [IU] via INTRAVENOUS
  Filled 2012-12-21: qty 5

## 2012-12-21 NOTE — Progress Notes (Signed)
Port flushes well but unable to obtain a blood return even after repositioning patient.

## 2013-01-18 ENCOUNTER — Other Ambulatory Visit: Payer: Self-pay | Admitting: *Deleted

## 2013-01-18 ENCOUNTER — Telehealth: Payer: Self-pay | Admitting: *Deleted

## 2013-01-18 DIAGNOSIS — C8589 Other specified types of non-Hodgkin lymphoma, extranodal and solid organ sites: Secondary | ICD-10-CM

## 2013-01-18 NOTE — Telephone Encounter (Signed)
Spoke with wife Joyce Gross, labs and CT scan moved to 02-11-13. Port flush and labs at 8:30 and CT @ 9:30. Wife verbalized understanding.

## 2013-02-11 ENCOUNTER — Encounter (HOSPITAL_COMMUNITY): Payer: Self-pay

## 2013-02-11 ENCOUNTER — Ambulatory Visit (HOSPITAL_BASED_OUTPATIENT_CLINIC_OR_DEPARTMENT_OTHER): Payer: Managed Care, Other (non HMO)

## 2013-02-11 ENCOUNTER — Telehealth: Payer: Self-pay | Admitting: *Deleted

## 2013-02-11 ENCOUNTER — Other Ambulatory Visit (HOSPITAL_BASED_OUTPATIENT_CLINIC_OR_DEPARTMENT_OTHER): Payer: Managed Care, Other (non HMO) | Admitting: Lab

## 2013-02-11 ENCOUNTER — Ambulatory Visit (HOSPITAL_COMMUNITY)
Admission: RE | Admit: 2013-02-11 | Discharge: 2013-02-11 | Disposition: A | Discharge status: Home / Self Care | Payer: Managed Care, Other (non HMO) | Source: Ambulatory Visit | Attending: Oncology | Admitting: Oncology

## 2013-02-11 VITALS — BP 154/88 | HR 91 | Temp 99.1°F

## 2013-02-11 DIAGNOSIS — C8590 Non-Hodgkin lymphoma, unspecified, unspecified site: Secondary | ICD-10-CM

## 2013-02-11 DIAGNOSIS — M47812 Spondylosis without myelopathy or radiculopathy, cervical region: Secondary | ICD-10-CM | POA: Insufficient documentation

## 2013-02-11 DIAGNOSIS — Z452 Encounter for adjustment and management of vascular access device: Secondary | ICD-10-CM

## 2013-02-11 DIAGNOSIS — C8589 Other specified types of non-Hodgkin lymphoma, extranodal and solid organ sites: Secondary | ICD-10-CM

## 2013-02-11 DIAGNOSIS — Z9221 Personal history of antineoplastic chemotherapy: Secondary | ICD-10-CM | POA: Insufficient documentation

## 2013-02-11 DIAGNOSIS — R918 Other nonspecific abnormal finding of lung field: Secondary | ICD-10-CM | POA: Insufficient documentation

## 2013-02-11 DIAGNOSIS — N289 Disorder of kidney and ureter, unspecified: Secondary | ICD-10-CM | POA: Insufficient documentation

## 2013-02-11 DIAGNOSIS — J320 Chronic maxillary sinusitis: Secondary | ICD-10-CM | POA: Insufficient documentation

## 2013-02-11 DIAGNOSIS — C8291 Follicular lymphoma, unspecified, lymph nodes of head, face, and neck: Secondary | ICD-10-CM | POA: Insufficient documentation

## 2013-02-11 LAB — COMPREHENSIVE METABOLIC PANEL (CC13)
AST: 44 U/L — ABNORMAL HIGH (ref 5–34)
Albumin: 3.6 g/dL (ref 3.5–5.0)
Alkaline Phosphatase: 72 U/L (ref 40–150)
BUN: 15.2 mg/dL (ref 7.0–26.0)
Potassium: 4.3 mEq/L (ref 3.5–5.1)
Sodium: 140 mEq/L (ref 136–145)
Total Bilirubin: 0.62 mg/dL (ref 0.20–1.20)

## 2013-02-11 LAB — CBC WITH DIFFERENTIAL/PLATELET
Basophils Absolute: 0.1 10*3/uL (ref 0.0–0.1)
EOS%: 2.9 % (ref 0.0–7.0)
LYMPH%: 17.8 % (ref 14.0–49.0)
MCH: 34.3 pg — ABNORMAL HIGH (ref 27.2–33.4)
MCV: 95.4 fL (ref 79.3–98.0)
MONO%: 13.8 % (ref 0.0–14.0)
RBC: 4.24 10*6/uL (ref 4.20–5.82)
RDW: 13.6 % (ref 11.0–14.6)

## 2013-02-11 MED ORDER — ALTEPLASE 2 MG IJ SOLR
2.0000 mg | Freq: Once | INTRAMUSCULAR | Status: AC
  Administered 2013-02-11: 2 mg
  Filled 2013-02-11: qty 2

## 2013-02-11 MED ORDER — SODIUM CHLORIDE 0.9 % IJ SOLN
10.0000 mL | INTRAMUSCULAR | Status: DC | PRN
  Administered 2013-02-11: 10 mL via INTRAVENOUS
  Filled 2013-02-11: qty 10

## 2013-02-11 MED ORDER — IOHEXOL 300 MG/ML  SOLN
125.0000 mL | Freq: Once | INTRAMUSCULAR | Status: AC | PRN
  Administered 2013-02-11: 125 mL via INTRAVENOUS

## 2013-02-11 NOTE — Telephone Encounter (Signed)
PT.'S RIGHT PORT A CATH IS CURLED UP IN THE JUGULAR VEIN. THIS REPORT WAS GIVEN TO DR.SHADAD'S NURSE, DIXIE SMITH,RN.

## 2013-02-11 NOTE — Progress Notes (Signed)
Patient's port flushes with ease, no swelling, burning or discomfort noted with flushing, however;  no blood return noted. Patient states no blood return the past couple of times he came for a port flush. Kin in CT called and asked about if they would use the port if blood return not noted. Kim, CT states they will use the port without blood return if it flushes with ease. Patient instructed and verbalized understanding he is to come back to the flush room after his CT scan for cath flo if no blood return noted in CT.  Patient's port tubing is kinked patient is unsure whether he wants to keep the port he wants to think about it. Patient states he will let us know at his Thursday appointment with Dr. Clelia Croft. Port de accessed. Tom Pare, NP notified.

## 2013-02-12 ENCOUNTER — Other Ambulatory Visit: Payer: Managed Care, Other (non HMO) | Admitting: Lab

## 2013-02-14 ENCOUNTER — Ambulatory Visit (HOSPITAL_BASED_OUTPATIENT_CLINIC_OR_DEPARTMENT_OTHER): Payer: Managed Care, Other (non HMO) | Admitting: Oncology

## 2013-02-14 ENCOUNTER — Telehealth: Payer: Self-pay | Admitting: Oncology

## 2013-02-14 VITALS — BP 140/87 | HR 96 | Temp 98.5°F | Resp 20 | Ht 71.0 in | Wt 271.0 lb

## 2013-02-14 DIAGNOSIS — C8589 Other specified types of non-Hodgkin lymphoma, extranodal and solid organ sites: Secondary | ICD-10-CM

## 2013-02-14 NOTE — Telephone Encounter (Signed)
gv pt appt schedule for November 2014 thru March 2015.

## 2013-02-14 NOTE — Progress Notes (Signed)
Hematology and Oncology Follow Up Visit  Tom Fuller [id number] 05-19-1951 61 y.o. 02/14/2013 3:41 PM    CC: Tom Fuller. Tom Ridgel, MD  Tom Fuller, [name]  Tom Perla, [name]: A 62 year old gentleman with stage IIIA low-grade follicular lymphoma diagnosed May 2010.  Past Therapy:  He is S/P Bendamustine and rituximab started on 08/25/11 for 6 cycles completed in 12/2011.  Interim History:  Tom Fuller presents today for a followup visit.  This is a pleasant gentleman with follicular lymphoma.  He has disease above and below the diaphragm.  He has cervical adenopathy as well as intra-abdominal adenopathy. This has mostly resolved after chemotherapy. Since his last visit, he has been about the same.He denies fever, chest pain, shortness of breath. No vomiting reported at this time. Patient still able to work and fatigue has not really affected performance status.  Appetite remains good. No new complaints. He has been working full time without any decline. No recent illness or hospitalizations.   Medications: I have reviewed the patient's current medications.  Current Outpatient Prescriptions  Medication Sig Dispense Refill  . aspirin 325 MG EC tablet Take 325 mg by mouth daily.        . celecoxib (CELEBREX) 200 MG capsule Take 200 mg by mouth daily as needed.       . fish oil-omega-3 fatty acids 1000 MG capsule Take 2 g by mouth 2 (two) times daily.        . metoprolol succinate (TOPROL-XL) 50 MG 24 hr tablet Take 1 tablet (50 mg total) by mouth daily.  90 tablet  3  . NEXIUM 40 MG capsule TAKE 1 TABLET EVERY DAY BEFORE BREAKFAST  30 capsule  6  . valsartan-hydrochlorothiazide (DIOVAN-HCT) 320-12.5 MG per tablet TAKE 1 TABLET EVERY DAY  90 tablet  2   No current facility-administered medications for this visit.    Allergies:  Allergies  Allergen Reactions  . Ampicillin     rash    Past Medical History, Surgical history, Social history, and Family History were  reviewed and updated.  Review of Systems:  Remaining ROS negative.  Physical Exam: Blood pressure 140/87, pulse 96, temperature 98.5 F (36.9 C), temperature source Oral, resp. rate 20, height 5\' 11"  (1.803 m), weight 271 lb (122.925 kg). ECOG: 0 General appearance: alert Head: Normocephalic, without obvious abnormality, atraumatic Neck: no adenopathy, no carotid bruit, no JVD, supple, symmetrical, trachea midline and thyroid not enlarged, symmetric, no tenderness/mass/nodules Lymph nodes: Cervical adenopathy decreased dramatically. Size range between 0.5 to 1 cm now Heart:regular rate and rhythm, S1, S2 normal, no murmur, click, rub or gallop Lung:chest clear, no wheezing, rales, normal symmetric air entry,  Abdomen: soft, non-tender, without masses or organomegaly Skin: noted erythema and induration in the paraumbilical area.  EXT:no erythema, induration, or nodules  Lab Results: Lab Results  Component Value Date   WBC 9.4 02/11/2013   HGB 14.5 02/11/2013   HCT 40.4 02/11/2013   MCV 95.4 02/11/2013   PLT 215 02/11/2013   CT CHEST, ABDOMEN AND PELVIS WITH CONTRAST  Technique: Multidetector CT imaging of the chest, abdomen and  pelvis was performed following the standard protocol during bolus  administration of intravenous contrast.  Contrast: OMNIPAQUE IOHEXOL 300 MG/ML SOLN  Comparison: 08/10/2012.  CT CHEST  Findings: The right Port-A-Cath has migrated with the catheter now  extending up to the right internal jugular vein where it is coiled.  The tip resides in the right internal jugular  vein.  There is no axillary lymphadenopathy. No mediastinal or hilar  lymphadenopathy. The 9 mm short-axis right paratracheal lymph node  measured previously is stable at 9 mm. The previously measured 11  mm short axis lymph node (image 25 today) measures 12 mm today.  Other para esophageal lymph nodes in the lower mediastinum are also  without substantial interval change. A short axis  8 mm lymph node  seen on image 29 today measured 9 mm in short axis previously.  Heart size is stable. No pericardial or pleural effusion.  Tiny right middle lobe pulmonary nodule seen on image 25 is  unchanged. 3 mm right lower lobe pulmonary nodule on image 31 is  also stable.  Bone windows reveal no worrisome lytic or sclerotic osseous  lesions.  IMPRESSION:  Stable exam. Small mediastinal lymph nodes are unchanged. No  evidence for new or progressive disease.  Tiny right pulmonary nodules are stable.  Right Port-A-Cath is coiled in the right internal jugular vein.  I personally called these results to Tom Fuller, the nurse for Dr.  Venda Fuller (who was out of the office today). The location of the Port-  A-Cath tip was specifically discussed.  CT ABDOMEN AND PELVIS  Findings: No focal abnormalities seen in the liver or spleen. The  stomach, duodenum, pancreas, gallbladder, and adrenal glands are  unremarkable. Tiny exophytic lesion in the left kidney is stable  comparing back to a study from 02/14/2012.  No abdominal aortic aneurysm. Portal vein and the superior  mesenteric vein are patent.  Amorphous soft tissue attenuation in the root of the small bowel  mesentery is stable. The central mesenteric lesion which was  measured on the previous study is in a different orientation today  making direct comparative measurement unreliable, but no  substantial change is evident. A more discreet area of soft tissue  attenuation in the mesentery, just anterior to the ligament of  Treitz, measures 2.0 x 1.2 cm today on image 68 compared to 2.0 x  1.2 cm on image 67 of the previous study. The left para-aortic  lymph node which was measured at 9 mm short axis on the previous  study is 9 mm in short axis today on image 71. Other scattered  retroperitoneal and para-aortic lymph nodes in the abdomen are  unchanged.  Imaging through the pelvis shows pelvic sidewall lymph nodes  bilaterally which are  stable. The 8 mm short-axis left common  iliac lymph node measured on the previous study measures 9 mm in  short axis today.  Two lymph nodes along the right pelvic sidewall do appear slightly  more prominent in the interval (see image 98 on today's study and  compared to image 98 to the previous exam). However, from a  measurement standpoint, they do not show substantial change. The  larger of these two lymph nodes measures 9 mm in short axis today  compared to 8 mm previously. The smaller of the two lymph nodes is  9 mm in short axis today compared to 6 mm previously.  Bone windows reveal no worrisome lytic or sclerotic osseous  lesions.  IMPRESSION:  No definite interval change. Two lymph nodes in the right external  iliac chain appears slightly more prominent today, but when  measured do not appear substantially changed and attention to these  lymph nodes on follow-up is recommended. Other intraperitoneal and  extraperitoneal nodal anatomy is stable.   Impression and Plan: This a pleasant 62 year old gentleman with the following issues: 1.  Stage III follicular lymphoma. He is S/P 6 cycles of chemotherapy and tolerated it well overall without any major toxicities. CT scan performed on 02/11/2013 continues to showe excellent response.  For now, we will continue with active surveillance and treat him with chemotherapy with relapse with symptoms.    2. Atrial fibrillation.  He is normal sinus rhythm at this point, followed by cardiologist.   3. Nausea/vomiting prophylaxis. Has compazine at home. This has resolved 4. Follow-up. In 6 months for MD visit and repeat CT scan in 12 months.  5. Port management: he will need a flush every 8 weeks.    Starlee Corralejo 9/18/20143:41 PM

## 2013-03-07 ENCOUNTER — Other Ambulatory Visit: Payer: Self-pay | Admitting: Family Medicine

## 2013-03-18 ENCOUNTER — Encounter: Payer: Self-pay | Admitting: Internal Medicine

## 2013-03-18 ENCOUNTER — Encounter: Payer: Self-pay | Admitting: *Deleted

## 2013-03-18 ENCOUNTER — Other Ambulatory Visit: Payer: Self-pay

## 2013-03-18 ENCOUNTER — Ambulatory Visit (INDEPENDENT_AMBULATORY_CARE_PROVIDER_SITE_OTHER): Payer: Managed Care, Other (non HMO) | Admitting: Internal Medicine

## 2013-03-18 VITALS — BP 122/78 | HR 81 | Ht 70.0 in | Wt 272.8 lb

## 2013-03-18 DIAGNOSIS — I10 Essential (primary) hypertension: Secondary | ICD-10-CM

## 2013-03-18 DIAGNOSIS — I4891 Unspecified atrial fibrillation: Secondary | ICD-10-CM

## 2013-03-18 MED ORDER — METOPROLOL SUCCINATE ER 50 MG PO TB24: 50.0000 mg | ORAL_TABLET | Freq: Every day | ORAL | Status: DC

## 2013-03-18 NOTE — Assessment & Plan Note (Signed)
>>  ASSESSMENT AND PLAN FOR ESSENTIAL HYPERTENSION WRITTEN ON 03/18/2013  6:21 PM BY Evans Lance, MD  His blood pressure is well controlled. No change in medical therapy. We discussed the importance of a low-sodium diet, and he states that he will try to do better at consuming less sodium.

## 2013-03-18 NOTE — Patient Instructions (Signed)
Your physician wants you to follow-up in: 12 months with Dr. Taylor. You will receive a reminder letter in the mail two months in advance. If you don't receive a letter, please call our office to schedule the follow-up appointment.    

## 2013-03-18 NOTE — Assessment & Plan Note (Signed)
The patient continues with atrial fibrillation. His ventricular rate appears to be well-controlled. His stroke risk is low, and he will continue aspirin.

## 2013-03-18 NOTE — Progress Notes (Signed)
HPI Mr. Girgis returns today for followup. He is a pleasant 62 yo man with a h/o atrial fib, HTN, chronic peripheral edema. No c/p or sob. He has been bothered by cellulitis. No fever or chills. He admits to dietary indiscretion. He is gaining weight. He also admits to eating to much salt. He has mild chronic peripheral edema which persists. He has had no recent cellulitis. Allergies  Allergen Reactions  . Ampicillin     rash     Current Outpatient Prescriptions  Medication Sig Dispense Refill  . aspirin 325 MG EC tablet Take 325 mg by mouth daily.        . celecoxib (CELEBREX) 200 MG capsule Take 200 mg by mouth daily as needed.       . fish oil-omega-3 fatty acids 1000 MG capsule Take 2 g by mouth 2 (two) times daily.        . metoprolol succinate (TOPROL-XL) 50 MG 24 hr tablet Take 1 tablet (50 mg total) by mouth daily.  90 tablet  3  . NEXIUM 40 MG capsule TAKE ONE CAPSULE BY MOUTH EVERY DAY  30 capsule  4  . valsartan-hydrochlorothiazide (DIOVAN-HCT) 320-12.5 MG per tablet TAKE 1 TABLET EVERY DAY  90 tablet  2   No current facility-administered medications for this visit.     Past Medical History  Diagnosis Date  . Atrial fibrillation   . Lymphoma, follicular     Dr.Shadad  . Cervical lymphadenopathy   . GERD (gastroesophageal reflux disease)   . Erectile dysfunction   . Hypertension     ROS:   All systems reviewed and negative except as noted in the HPI.   Past Surgical History  Procedure Laterality Date  . Vasectomy    . A bsc    . Abscess drainage      under rt eye   . Deep neck lymph node biopsy / excision  2011     Family History  Problem Relation Age of Onset  . Hypertension Mother   . Kidney disease Mother   . Hypertension Father   . Hypertension Brother   . Kidney disease Maternal Aunt   . Kidney disease Maternal Grandmother   . Diabetes Paternal Grandfather   . Hypertension Paternal Grandfather      History   Social History  . Marital  Status: Married    Spouse Name: N/A    Number of Children: N/A  . Years of Education: N/A   Occupational History  . Not on file.   Social History Main Topics  . Smoking status: Never Smoker   . Smokeless tobacco: Never Used  . Alcohol Use: No  . Drug Use: No  . Sexual Activity: Yes    Partners: Female   Other Topics Concern  . Not on file   Social History Narrative  . No narrative on file     BP 122/78  Pulse 81  Ht 5\' 10"  (1.778 m)  Wt 272 lb 12.8 oz (123.741 kg)  BMI 39.14 kg/m2  Physical Exam:  obese appearing middle-aged man, NAD HEENT: Unremarkable Neck: 6 cm JVD, no thyromegally Back:  No CVA tenderness Lungs:  Clear with no wheezes. HEART: IRegular rate rhythm, no murmurs, no rubs, no clicks Abd:  soft, positive bowel sounds, no organomegally, no rebound, no guarding Ext:  2 plus pulses, peripheral edema bilaterally, no cyanosis, no clubbing Skin:  No rashes no nodules.  Neuro:  CN II through XII intact, motor grossly intact  EKG  Atrial fibrillation with an CVR.   Assess/Plan:

## 2013-03-18 NOTE — Assessment & Plan Note (Signed)
His blood pressure is well controlled. No change in medical therapy. We discussed the importance of a low-sodium diet, and he states that he will try to do better at consuming less sodium.

## 2013-04-04 ENCOUNTER — Other Ambulatory Visit: Payer: Self-pay

## 2013-04-12 ENCOUNTER — Ambulatory Visit (HOSPITAL_BASED_OUTPATIENT_CLINIC_OR_DEPARTMENT_OTHER): Payer: Managed Care, Other (non HMO)

## 2013-04-12 ENCOUNTER — Encounter: Payer: Self-pay | Admitting: Emergency Medicine

## 2013-04-12 VITALS — BP 146/80 | HR 88 | Temp 97.5°F | Resp 18

## 2013-04-12 DIAGNOSIS — C8589 Other specified types of non-Hodgkin lymphoma, extranodal and solid organ sites: Secondary | ICD-10-CM

## 2013-04-12 DIAGNOSIS — Z452 Encounter for adjustment and management of vascular access device: Secondary | ICD-10-CM

## 2013-04-12 DIAGNOSIS — C8590 Non-Hodgkin lymphoma, unspecified, unspecified site: Secondary | ICD-10-CM

## 2013-04-12 MED ORDER — SODIUM CHLORIDE 0.9 % IJ SOLN
10.0000 mL | INTRAMUSCULAR | Status: DC | PRN
  Administered 2013-04-12: 10 mL via INTRAVENOUS
  Filled 2013-04-12: qty 10

## 2013-04-12 MED ORDER — HEPARIN SOD (PORK) LOCK FLUSH 100 UNIT/ML IV SOLN
500.0000 [IU] | Freq: Once | INTRAVENOUS | Status: AC
  Administered 2013-04-12: 500 [IU] via INTRAVENOUS
  Filled 2013-04-12: qty 5

## 2013-06-07 ENCOUNTER — Ambulatory Visit (HOSPITAL_BASED_OUTPATIENT_CLINIC_OR_DEPARTMENT_OTHER): Payer: BC Managed Care – PPO

## 2013-06-07 VITALS — BP 129/63 | HR 88 | Temp 98.4°F

## 2013-06-07 DIAGNOSIS — C8299 Follicular lymphoma, unspecified, extranodal and solid organ sites: Secondary | ICD-10-CM

## 2013-06-07 DIAGNOSIS — Z452 Encounter for adjustment and management of vascular access device: Secondary | ICD-10-CM

## 2013-06-07 DIAGNOSIS — Z95828 Presence of other vascular implants and grafts: Secondary | ICD-10-CM

## 2013-06-07 MED ORDER — SODIUM CHLORIDE 0.9 % IJ SOLN
10.0000 mL | INTRAMUSCULAR | Status: DC | PRN
  Administered 2013-06-07: 10 mL via INTRAVENOUS
  Filled 2013-06-07: qty 10

## 2013-06-07 MED ORDER — HEPARIN SOD (PORK) LOCK FLUSH 100 UNIT/ML IV SOLN
500.0000 [IU] | Freq: Once | INTRAVENOUS | Status: AC
  Administered 2013-06-07: 500 [IU] via INTRAVENOUS
  Filled 2013-06-07: qty 5

## 2013-06-07 NOTE — Patient Instructions (Signed)
Implanted Port Instructions  An implanted port is a central line that has a round shape and is placed under the skin. It is used for long-term IV (intravenous) access for:  · Medicine.  · Fluids.  · Liquid nutrition, such as TPN (total parenteral nutrition).  · Blood samples.  Ports can be placed:  · In the chest area just below the collarbone (this is the most common place.)  · In the arms.  · In the belly (abdomen) area.  · In the legs.  PARTS OF THE PORT  A port has 2 main parts:  · The reservoir. The reservoir is round, disc-shaped, and will be a small, raised area under your skin.  · The reservoir is the part where a needle is inserted (accessed) to either give medicines or to draw blood.  · The catheter. The catheter is a long, slender tube that extends from the reservoir. The catheter is placed into a large vein.  · Medicine that is inserted into the reservoir goes into the catheter and then into the vein.  INSERTION OF THE PORT  · The port is surgically placed in either an operating room or in a procedural area (interventional radiology).  · Medicine may be given to help you relax during the procedure.  · The skin where the port will be inserted is numbed (local anesthetic).  · 1 or 2 small cuts (incisions) will be made in the skin to insert the port.  · The port can be used after it has been inserted.  INCISION SITE CARE  · The incision site may have small adhesive strips on it. This helps keep the incision site closed. Sometimes, no adhesive strips are placed. Instead of adhesive strips, a special kind of surgical glue is used to keep the incision closed.  · If adhesive strips were placed on the incision sites, do not take them off. They will fall off on their own.  · The incision site may be sore for 1 to 2 days. Pain medicine can help.  · Do not get the incision site wet. Bathe or shower as directed by your caregiver.  · The incision site should heal in 5 to 7 days. A small scar may form after the  incision has healed.  ACCESSING THE PORT  Special steps must be taken to access the port:  · Before the port is accessed, a numbing cream can be placed on the skin. This helps numb the skin over the port site.  · A sterile technique is used to access the port.  · The port is accessed with a needle. Only "non-coring" port needles should be used to access the port. Once the port is accessed, a blood return should be checked. This helps ensure the port is in the vein and is not clogged (clotted).  · If your caregiver believes your port should remain accessed, a clear (transparent) bandage will be placed over the needle site. The bandage and needle will need to be changed every week or as directed by your caregiver.  · Keep the bandage covering the needle clean and dry. Do not get it wet. Follow your caregiver's instructions on how to take a shower or bath when the port is accessed.  · If your port does not need to stay accessed, no bandage is needed over the port.  FLUSHING THE PORT  Flushing the port keeps it from getting clogged. How often the port is flushed depends on:  · If a   constant infusion is running. If a constant infusion is running, the port may not need to be flushed.  · If intermittent medicines are given.  · If the port is not being used.  For intermittent medicines:  · The port will need to be flushed:  · After medicines have been given.  · After blood has been drawn.  · As part of routine maintenance.  · A port is normally flushed with:  · Normal saline.  · Heparin.  · Follow your caregiver's advice on how often, how much, and the type of flush to use on your port.  IMPORTANT PORT INFORMATION  · Tell your caregiver if you are allergic to heparin.  · After your port is placed, you will get a manufacturer's information card. The card has information about your port. Keep this card with you at all times.  · There are many types of ports available. Know what kind of port you have.  · In case of an  emergency, it may be helpful to wear a medical alert bracelet. This can help alert health care workers that you have a port.  · The port can stay in for as long as your caregiver believes it is necessary.  · When it is time for the port to come out, surgery will be done to remove it. The surgery will be similar to how the port was put in.  · If you are in the hospital or clinic:  · Your port will be taken care of and flushed by a nurse.  · If you are at home:  · A home health care nurse may give medicines and take care of the port.  · You or a family member can get special training and directions for giving medicine and taking care of the port at home.  SEEK IMMEDIATE MEDICAL CARE IF:   · Your port does not flush or you are unable to get a blood return.  · New drainage or pus is coming from the incision.  · A bad smell is coming from the incision site.  · You develop swelling or increased redness at the incision site.  · You develop increased swelling or pain at the port site.  · You develop swelling or pain in the surrounding skin near the port.  · You have an oral temperature above 102° F (38.9° C), not controlled by medicine.  MAKE SURE YOU:   · Understand these instructions.  · Will watch your condition.  · Will get help right away if you are not doing well or get worse.  Document Released: 05/16/2005 Document Revised: 08/08/2011 Document Reviewed: 08/07/2008  [employer]® Patient Information ©2014 [employer].

## 2013-08-02 ENCOUNTER — Encounter: Payer: Self-pay | Admitting: Oncology

## 2013-08-02 ENCOUNTER — Other Ambulatory Visit (HOSPITAL_BASED_OUTPATIENT_CLINIC_OR_DEPARTMENT_OTHER): Payer: BC Managed Care – PPO

## 2013-08-02 ENCOUNTER — Ambulatory Visit (HOSPITAL_BASED_OUTPATIENT_CLINIC_OR_DEPARTMENT_OTHER): Payer: BC Managed Care – PPO | Admitting: Oncology

## 2013-08-02 ENCOUNTER — Ambulatory Visit: Payer: BC Managed Care – PPO

## 2013-08-02 VITALS — BP 129/83 | HR 75 | Temp 98.1°F | Resp 18 | Ht 70.0 in | Wt 265.9 lb

## 2013-08-02 DIAGNOSIS — C8299 Follicular lymphoma, unspecified, extranodal and solid organ sites: Secondary | ICD-10-CM

## 2013-08-02 DIAGNOSIS — C8589 Other specified types of non-Hodgkin lymphoma, extranodal and solid organ sites: Secondary | ICD-10-CM

## 2013-08-02 DIAGNOSIS — I4891 Unspecified atrial fibrillation: Secondary | ICD-10-CM

## 2013-08-02 DIAGNOSIS — Z95828 Presence of other vascular implants and grafts: Secondary | ICD-10-CM

## 2013-08-02 LAB — CBC WITH DIFFERENTIAL/PLATELET
BASO%: 0.9 % (ref 0.0–2.0)
BASOS ABS: 0.1 10*3/uL (ref 0.0–0.1)
EOS ABS: 0.4 10*3/uL (ref 0.0–0.5)
EOS%: 5 % (ref 0.0–7.0)
HEMATOCRIT: 41.9 % (ref 38.4–49.9)
HEMOGLOBIN: 14.2 g/dL (ref 13.0–17.1)
LYMPH%: 24 % (ref 14.0–49.0)
MCH: 32.8 pg (ref 27.2–33.4)
MCHC: 33.9 g/dL (ref 32.0–36.0)
MCV: 96.7 fL (ref 79.3–98.0)
MONO#: 0.9 10*3/uL (ref 0.1–0.9)
MONO%: 10 % (ref 0.0–14.0)
NEUT%: 60.1 % (ref 39.0–75.0)
NEUTROS ABS: 5.2 10*3/uL (ref 1.5–6.5)
PLATELETS: 213 10*3/uL (ref 140–400)
RBC: 4.34 10*6/uL (ref 4.20–5.82)
RDW: 13.3 % (ref 11.0–14.6)
WBC: 8.6 10*3/uL (ref 4.0–10.3)
lymph#: 2.1 10*3/uL (ref 0.9–3.3)

## 2013-08-02 LAB — COMPREHENSIVE METABOLIC PANEL (CC13)
ALT: 55 U/L (ref 0–55)
ANION GAP: 9 meq/L (ref 3–11)
AST: 41 U/L — ABNORMAL HIGH (ref 5–34)
Albumin: 3.9 g/dL (ref 3.5–5.0)
Alkaline Phosphatase: 79 U/L (ref 40–150)
BILIRUBIN TOTAL: 0.55 mg/dL (ref 0.20–1.20)
BUN: 18.8 mg/dL (ref 7.0–26.0)
CALCIUM: 9.8 mg/dL (ref 8.4–10.4)
CHLORIDE: 104 meq/L (ref 98–109)
CO2: 29 meq/L (ref 22–29)
CREATININE: 1.1 mg/dL (ref 0.7–1.3)
GLUCOSE: 126 mg/dL (ref 70–140)
Potassium: 3.7 mEq/L (ref 3.5–5.1)
Sodium: 142 mEq/L (ref 136–145)
Total Protein: 7 g/dL (ref 6.4–8.3)

## 2013-08-02 MED ORDER — HEPARIN SOD (PORK) LOCK FLUSH 100 UNIT/ML IV SOLN
500.0000 [IU] | Freq: Once | INTRAVENOUS | Status: AC
  Administered 2013-08-02: 500 [IU] via INTRAVENOUS
  Filled 2013-08-02: qty 5

## 2013-08-02 MED ORDER — SODIUM CHLORIDE 0.9 % IJ SOLN
10.0000 mL | INTRAMUSCULAR | Status: DC | PRN
  Administered 2013-08-02: 10 mL via INTRAVENOUS
  Filled 2013-08-02: qty 10

## 2013-08-02 NOTE — Progress Notes (Signed)
Hematology and Oncology Follow Up Visit  Tom Fuller [id number] 03-23-1951 63 y.o. 08/02/2013 4:01 PM    CC: Tom Fuller. Tom Le, MD  Tom Fuller, [name]  Tom Chessman, [name]: A 63 year old gentleman with stage IIIA low-grade follicular lymphoma diagnosed May 2010.  Past Therapy:  He is S/P Bendamustine and rituximab started on 08/25/11 for 6 cycles completed in 12/2011.  Interim History:  [name] presents today for a followup visit. Since his last visit, he has been about the same. He denies fever, chest pain, shortness of breath. No vomiting reported at this time. Patient still able to work and fatigue has not really affected performance status.  Appetite remains good. No new complaints. He has been working full time without any decline. No recent illness or hospitalizations. Not reporting any constitutional symptoms no weakness fatigue or tiredness. He continued to work full-time without any hindrance or decline.  Medications: I have reviewed the patient's current medications.  Current Outpatient Prescriptions  Medication Sig Dispense Refill  . aspirin 325 MG EC tablet Take 325 mg by mouth daily.        . celecoxib (CELEBREX) 200 MG capsule Take 200 mg by mouth daily as needed.       . fish oil-omega-3 fatty acids 1000 MG capsule Take 2 g by mouth 2 (two) times daily.        . metoprolol succinate (TOPROL-XL) 50 MG 24 hr tablet Take 1 tablet (50 mg total) by mouth daily.  90 tablet  3  . NEXIUM 40 MG capsule TAKE ONE CAPSULE BY MOUTH EVERY DAY  30 capsule  4  . valsartan-hydrochlorothiazide (DIOVAN-HCT) 320-12.5 MG per tablet TAKE 1 TABLET EVERY DAY  90 tablet  2   No current facility-administered medications for this visit.   Facility-Administered Medications Ordered in Other Visits  Medication Dose Route Frequency Provider Last Rate Last Dose  . heparin lock flush 100 unit/mL  500 Units Intravenous Once Wyatt Portela, MD      . sodium chloride 0.9 %  injection 10 mL  10 mL Intravenous PRN Wyatt Portela, MD        Allergies:  Allergies  Allergen Reactions  . Ampicillin     rash    Past Medical History, Surgical history, Social history, and Family History were reviewed and updated.  Review of Systems:  Remaining ROS negative.  Physical Exam: Blood pressure 129/83, pulse 75, temperature 98.1 F (36.7 C), temperature source Oral, resp. rate 18, height 5\' 10"  (1.778 m), weight 265 lb 14.4 oz (120.611 kg), SpO2 95.00%. ECOG: 0 General appearance: alert Head: Normocephalic, without obvious abnormality, atraumatic Neck: no adenopathy, no carotid bruit, no JVD, supple, symmetrical, trachea midline and thyroid not enlarged, symmetric, no tenderness/mass/nodules Lymph nodes: Cervical adenopathy decreased dramatically. Size range between 0.5 to 1 cm now Heart:regular rate and rhythm, S1, S2 normal, no murmur, click, rub or gallop Lung:chest clear, no wheezing, rales, normal symmetric air entry,  Abdomen: soft, non-tender, without masses or organomegaly Skin: noted erythema and induration in the paraumbilical area.  EXT:no erythema, induration, or nodules  Lab Results: Lab Results  Component Value Date   WBC 9.4 02/11/2013   HGB 14.5 02/11/2013   HCT 40.4 02/11/2013   MCV 95.4 02/11/2013   PLT 215 02/11/2013   Impression and Plan: This a pleasant 62 year old gentleman with the following issues: 1. Stage III follicular lymphoma. He is S/P 6 cycles of chemotherapy and tolerated it well overall  without any major toxicities. CT scan performed on 02/11/2013 continues to showe excellent response. We will continue with active surveillance and treat him with chemotherapy with relapse with symptoms.    2. Atrial fibrillation.  He is normal sinus rhythm at this point, followed by cardiologist.   3. Nausea/vomiting prophylaxis. Has compazine at home. This has resolved 4. Follow-up. In 6 months for MD visit and repeat CT scan at that time.    5. Port management: he will need a flush every 6 to 8 weeks.    [facility] 3/6/20154:01 PM

## 2013-08-05 ENCOUNTER — Telehealth: Payer: Self-pay | Admitting: Oncology

## 2013-08-05 NOTE — Telephone Encounter (Signed)
S/w the pt's wife and she is aware of the April 2015 flush appt and to pick the rest of the appt schedules up till sept 2015.

## 2013-08-08 ENCOUNTER — Other Ambulatory Visit: Payer: Self-pay | Admitting: Internal Medicine

## 2013-09-13 ENCOUNTER — Ambulatory Visit (HOSPITAL_BASED_OUTPATIENT_CLINIC_OR_DEPARTMENT_OTHER): Payer: BC Managed Care – PPO

## 2013-09-13 VITALS — BP 119/60 | HR 89

## 2013-09-13 DIAGNOSIS — Z452 Encounter for adjustment and management of vascular access device: Secondary | ICD-10-CM

## 2013-09-13 DIAGNOSIS — C8299 Follicular lymphoma, unspecified, extranodal and solid organ sites: Secondary | ICD-10-CM

## 2013-09-13 DIAGNOSIS — Z95828 Presence of other vascular implants and grafts: Secondary | ICD-10-CM

## 2013-09-13 MED ORDER — SODIUM CHLORIDE 0.9 % IJ SOLN
10.0000 mL | INTRAMUSCULAR | Status: DC | PRN
  Administered 2013-09-13: 10 mL via INTRAVENOUS
  Filled 2013-09-13: qty 10

## 2013-09-13 MED ORDER — HEPARIN SOD (PORK) LOCK FLUSH 100 UNIT/ML IV SOLN
500.0000 [IU] | Freq: Once | INTRAVENOUS | Status: AC
  Administered 2013-09-13: 500 [IU] via INTRAVENOUS
  Filled 2013-09-13: qty 5

## 2013-10-25 ENCOUNTER — Ambulatory Visit (HOSPITAL_BASED_OUTPATIENT_CLINIC_OR_DEPARTMENT_OTHER): Payer: BC Managed Care – PPO

## 2013-10-25 VITALS — BP 129/88 | HR 87 | Temp 98.3°F

## 2013-10-25 DIAGNOSIS — Z95828 Presence of other vascular implants and grafts: Secondary | ICD-10-CM

## 2013-10-25 DIAGNOSIS — C8299 Follicular lymphoma, unspecified, extranodal and solid organ sites: Secondary | ICD-10-CM

## 2013-10-25 DIAGNOSIS — Z452 Encounter for adjustment and management of vascular access device: Secondary | ICD-10-CM

## 2013-10-25 MED ORDER — SODIUM CHLORIDE 0.9 % IJ SOLN
10.0000 mL | INTRAMUSCULAR | Status: DC | PRN
  Administered 2013-10-25: 10 mL via INTRAVENOUS
  Filled 2013-10-25: qty 10

## 2013-10-25 MED ORDER — HEPARIN SOD (PORK) LOCK FLUSH 100 UNIT/ML IV SOLN
500.0000 [IU] | Freq: Once | INTRAVENOUS | Status: AC
  Administered 2013-10-25: 500 [IU] via INTRAVENOUS
  Filled 2013-10-25: qty 5

## 2013-12-06 ENCOUNTER — Ambulatory Visit (HOSPITAL_BASED_OUTPATIENT_CLINIC_OR_DEPARTMENT_OTHER): Payer: BC Managed Care – PPO

## 2013-12-06 VITALS — BP 121/71 | HR 84 | Temp 98.2°F

## 2013-12-06 DIAGNOSIS — C8589 Other specified types of non-Hodgkin lymphoma, extranodal and solid organ sites: Secondary | ICD-10-CM

## 2013-12-06 DIAGNOSIS — Z95828 Presence of other vascular implants and grafts: Secondary | ICD-10-CM

## 2013-12-06 DIAGNOSIS — Z452 Encounter for adjustment and management of vascular access device: Secondary | ICD-10-CM

## 2013-12-06 MED ORDER — SODIUM CHLORIDE 0.9 % IJ SOLN
10.0000 mL | INTRAMUSCULAR | Status: DC | PRN
  Administered 2013-12-06: 10 mL via INTRAVENOUS
  Filled 2013-12-06: qty 10

## 2013-12-06 MED ORDER — HEPARIN SOD (PORK) LOCK FLUSH 100 UNIT/ML IV SOLN
500.0000 [IU] | Freq: Once | INTRAVENOUS | Status: AC
  Administered 2013-12-06: 500 [IU] via INTRAVENOUS
  Filled 2013-12-06: qty 5

## 2013-12-06 NOTE — Patient Instructions (Signed)

## 2014-01-29 ENCOUNTER — Telehealth: Payer: Self-pay | Admitting: Oncology

## 2014-01-29 NOTE — Telephone Encounter (Signed)
Lft msg confirming r/s time change only for 09/11 per provider req.....mailed updated schedule...KJ

## 2014-02-04 ENCOUNTER — Encounter (HOSPITAL_COMMUNITY): Payer: Self-pay

## 2014-02-04 ENCOUNTER — Other Ambulatory Visit (HOSPITAL_BASED_OUTPATIENT_CLINIC_OR_DEPARTMENT_OTHER): Payer: BC Managed Care – PPO

## 2014-02-04 ENCOUNTER — Ambulatory Visit: Payer: BC Managed Care – PPO

## 2014-02-04 ENCOUNTER — Ambulatory Visit (HOSPITAL_COMMUNITY)
Admission: RE | Admit: 2014-02-04 | Discharge: 2014-02-04 | Disposition: A | Discharge status: Home / Self Care | Payer: BC Managed Care – PPO | Source: Ambulatory Visit | Attending: Oncology | Admitting: Oncology

## 2014-02-04 ENCOUNTER — Ambulatory Visit (HOSPITAL_BASED_OUTPATIENT_CLINIC_OR_DEPARTMENT_OTHER): Payer: BC Managed Care – PPO

## 2014-02-04 VITALS — BP 141/97 | HR 77 | Temp 98.1°F

## 2014-02-04 DIAGNOSIS — C8589 Other specified types of non-Hodgkin lymphoma, extranodal and solid organ sites: Secondary | ICD-10-CM

## 2014-02-04 DIAGNOSIS — R918 Other nonspecific abnormal finding of lung field: Secondary | ICD-10-CM | POA: Diagnosis not present

## 2014-02-04 DIAGNOSIS — Z452 Encounter for adjustment and management of vascular access device: Secondary | ICD-10-CM

## 2014-02-04 DIAGNOSIS — Z95828 Presence of other vascular implants and grafts: Secondary | ICD-10-CM

## 2014-02-04 LAB — COMPREHENSIVE METABOLIC PANEL (CC13)
ALBUMIN: 3.6 g/dL (ref 3.5–5.0)
ALT: 62 U/L — AB (ref 0–55)
ANION GAP: 7 meq/L (ref 3–11)
AST: 47 U/L — AB (ref 5–34)
Alkaline Phosphatase: 72 U/L (ref 40–150)
BUN: 10.7 mg/dL (ref 7.0–26.0)
CALCIUM: 9.4 mg/dL (ref 8.4–10.4)
CHLORIDE: 103 meq/L (ref 98–109)
CO2: 29 meq/L (ref 22–29)
CREATININE: 1 mg/dL (ref 0.7–1.3)
Glucose: 136 mg/dl (ref 70–140)
POTASSIUM: 4.4 meq/L (ref 3.5–5.1)
Sodium: 139 mEq/L (ref 136–145)
Total Bilirubin: 0.57 mg/dL (ref 0.20–1.20)
Total Protein: 6.8 g/dL (ref 6.4–8.3)

## 2014-02-04 LAB — CBC WITH DIFFERENTIAL/PLATELET
BASO%: 1.1 % (ref 0.0–2.0)
BASOS ABS: 0.1 10*3/uL (ref 0.0–0.1)
EOS%: 3.3 % (ref 0.0–7.0)
Eosinophils Absolute: 0.2 10*3/uL (ref 0.0–0.5)
HEMATOCRIT: 41 % (ref 38.4–49.9)
HEMOGLOBIN: 13.9 g/dL (ref 13.0–17.1)
LYMPH#: 2 10*3/uL (ref 0.9–3.3)
LYMPH%: 28.6 % (ref 14.0–49.0)
MCH: 32.3 pg (ref 27.2–33.4)
MCHC: 33.9 g/dL (ref 32.0–36.0)
MCV: 95.5 fL (ref 79.3–98.0)
MONO#: 0.7 10*3/uL (ref 0.1–0.9)
MONO%: 10.5 % (ref 0.0–14.0)
NEUT#: 4 10*3/uL (ref 1.5–6.5)
NEUT%: 56.5 % (ref 39.0–75.0)
Platelets: 220 10*3/uL (ref 140–400)
RBC: 4.29 10*6/uL (ref 4.20–5.82)
RDW: 13.5 % (ref 11.0–14.6)
WBC: 7.1 10*3/uL (ref 4.0–10.3)

## 2014-02-04 MED ORDER — HEPARIN SOD (PORK) LOCK FLUSH 100 UNIT/ML IV SOLN
500.0000 [IU] | Freq: Once | INTRAVENOUS | Status: AC
  Administered 2014-02-04: 500 [IU] via INTRAVENOUS
  Filled 2014-02-04: qty 5

## 2014-02-04 MED ORDER — IOHEXOL 300 MG/ML  SOLN
100.0000 mL | Freq: Once | INTRAMUSCULAR | Status: AC | PRN
  Administered 2014-02-04: 100 mL via INTRAVENOUS

## 2014-02-04 MED ORDER — SODIUM CHLORIDE 0.9 % IJ SOLN
10.0000 mL | INTRAMUSCULAR | Status: DC | PRN
  Administered 2014-02-04: 10 mL via INTRAVENOUS
  Filled 2014-02-04: qty 10

## 2014-02-04 NOTE — Patient Instructions (Signed)

## 2014-02-04 NOTE — Progress Notes (Signed)
Patient in for Port-A-Cath flush and labs today. Patient accessed and flushed without difficulty. No blood return noted with flush. Patient denies any pain or discomfort. All labs drawn by Phlebotomist today.

## 2014-02-06 ENCOUNTER — Other Ambulatory Visit: Payer: Self-pay | Admitting: Family Medicine

## 2014-02-07 ENCOUNTER — Telehealth: Payer: Self-pay | Admitting: Oncology

## 2014-02-07 ENCOUNTER — Ambulatory Visit (HOSPITAL_BASED_OUTPATIENT_CLINIC_OR_DEPARTMENT_OTHER): Payer: BC Managed Care – PPO | Admitting: Oncology

## 2014-02-07 ENCOUNTER — Encounter: Payer: Self-pay | Admitting: Oncology

## 2014-02-07 VITALS — BP 126/88 | HR 77 | Temp 98.8°F | Resp 19 | Ht 70.0 in | Wt 261.7 lb

## 2014-02-07 DIAGNOSIS — C8299 Follicular lymphoma, unspecified, extranodal and solid organ sites: Secondary | ICD-10-CM

## 2014-02-07 DIAGNOSIS — C8290 Follicular lymphoma, unspecified, unspecified site: Secondary | ICD-10-CM

## 2014-02-07 DIAGNOSIS — C8589 Other specified types of non-Hodgkin lymphoma, extranodal and solid organ sites: Secondary | ICD-10-CM

## 2014-02-07 NOTE — Telephone Encounter (Signed)
gv and printed appt sched and avs for t for sept 2015 and Sept 2016

## 2014-02-07 NOTE — Telephone Encounter (Signed)
r/s pt appt for 44mth visit....Marland Kitchenreprinted appt sched/avs for pt

## 2014-02-07 NOTE — Progress Notes (Signed)
Hematology and Oncology Follow Up Visit  Tom Fuller [id number] 1950-12-05 63 y.o. 02/07/2014 2:09 PM    CC: Tom Fuller. Tom Le, MD  [name], [name]  [name], [name]: A 63 year old gentleman with stage IIIA low-grade follicular lymphoma diagnosed May 2010.  Past Therapy:  He is S/P Bendamustine and rituximab started on 08/25/11 for 6 cycles completed in 12/2011.  Interim History:  [name] presents today for a followup visit. Since his last visit, he reports no complaints. He is now referred any lymphadenopathy or constitutional symptoms. He continues to work full time without any decline. He denies fever, chest pain, shortness of breath. No vomiting reported at this time.  Appetite remains good. No new complaints. He has been working full time without any decline. No recent illness or hospitalizations. He denies any headaches, blurry vision or syncope. He did not report any chest pain, shortness of breath or cough. His activity palpitation or leg edema. He does not report any nausea, change in his bowel habits. This his review systems unremarkable.  Medications: I have reviewed the patient's current medications.  Current Outpatient Prescriptions  Medication Sig Dispense Refill  . aspirin 325 MG EC tablet Take 325 mg by mouth daily.        . celecoxib (CELEBREX) 200 MG capsule Take 200 mg by mouth daily as needed.       . fish oil-omega-3 fatty acids 1000 MG capsule Take 2 g by mouth 2 (two) times daily.        . metoprolol succinate (TOPROL-XL) 50 MG 24 hr tablet Take 1 tablet (50 mg total) by mouth daily.  90 tablet  3  . NEXIUM 40 MG capsule TAKE ONE CAPSULE BY MOUTH EVERY DAY  30 capsule  4  . valsartan-hydrochlorothiazide (DIOVAN-HCT) 320-12.5 MG per tablet TAKE 1 TABLET EVERY DAY  90 tablet  2   No current facility-administered medications for this visit.    Allergies:  Allergies  Allergen Reactions  . Ampicillin     rash    Past Medical  History, Surgical history, Social history, and Family History were reviewed and updated.  Review of Systems:  Remaining ROS negative.  Physical Exam: Blood pressure 126/88, pulse 77, temperature 98.8 F (37.1 C), temperature source Oral, resp. rate 19, height 5\' 10"  (1.778 m), weight 261 lb 11.2 oz (118.706 kg). ECOG: 0 General appearance: alert Head: Normocephalic, without obvious abnormality Neck: no adenopathy Lymph nodes: Cervical adenopathy decreased dramatically. Size range between 0.5 to 1 cm now Heart:regular rate and rhythm, S1, S2 normal, no murmur, click, rub or gallop Lung:chest clear, no wheezing, rales, normal symmetric air entry,  Abdomen: soft, non-tender, without masses or organomegaly Skin: noted erythema and induration in the paraumbilical area.  EXT:no erythema, induration, or nodules  Lab Results: Lab Results  Component Value Date   WBC 7.1 02/04/2014   HGB 13.9 02/04/2014   HCT 41.0 02/04/2014   MCV 95.5 02/04/2014   PLT 220 02/04/2014    EXAM:  CT OF THE NECK WITH CONTRAST  CT OF THE CHEST WITH CONTRAST  CT OF THE ABDOMEN AND PELVIS WITH CONTRAST  TECHNIQUE:  Multidetector CT imaging of the neck was performed with intravenous  contrast.; Multidetector CT imaging of the abdomen and pelvis was  performed following the standard protocol during bolus  administration of intravenous contrast.; Multidetector CT imaging of  the chest was performed following the standard protocol during bolus  administration of intravenous contrast.  CONTRAST: 146mL OMNIPAQUE IOHEXOL 300 MG/ML SOLN  COMPARISON: 02/11/2013  FINDINGS:  CT NECK FINDINGS  Numerous small less than 1 cm lymph nodes are seen throughout the  jugular chains bilaterally, and in the bilateral submandibular and  submental regions. These show no significant change in size or  number since previous exam. No new or increased areas of  lymphadenopathy are identified within the neck. No other soft tissue  masses  are identified.  CT CHEST FINDINGS  Mediastinum/Hilar Regions: Small less than 1 cm mediastinal lymph  nodes are seen which are stable. Index lymph node in the right  paratracheal region on image 11 measures 9 mm. No hilar  lymphadenopathy identified.  Other Thoracic Lymphadenopathy: None.  Lungs: Few tiny sub-cm right lung nodules remain stable. No new or  enlarging pulmonary nodules or masses are identified. No evidence of  pulmonary infiltrate or central endobronchial lesion.  Pleura: No evidence of effusion or mass.  Vascular/Cardiac: No thoracic aortic aneurysm or other significant  abnormality identified.  Musculoskeletal: No suspicious bone lesions identified.  Other: None.  CT ABDOMEN AND PELVIS FINDINGS  Liver: No mass or other parenchymal abnormality identified.  Gallbladder/Biliary: Unremarkable.  Pancreas: No mass, inflammatory changes, or other parenchymal  abnormality identified.  Spleen: Within normal limits in size and appearance.  Adrenal Glands: No mass identified.  Kidneys/Urinary Tract: No masses identified. No evidence of  hydronephrosis.  Lymph Nodes: Small less than 1 cm lymph nodes are again seen  throughout the small bowel mesentery with associated hazy mesenteric  opacity. This remains stable. Mild retroperitoneal lymphadenopathy  is also stable with largest lymph node in the left paraaortic space  measuring 10 mm on image 79 of series 6. Pelvic lymphadenopathy in  along both iliac lymph node chains is also stable with lymph nodes  measuring less than 1 cm. Index lymph node in the left external  iliac chain on image 108 measures 9 mm, without change.  Pelvic/Reproductive Organs: No mass or other significant abnormality  identified.  Bowel/Peritoneum: No evidence of bowel wall thickening, mass, or  obstruction.  Vascular: No evidence of abdominal aortic aneurysm.  Musculoskeletal: No suspicious bone lesions identified.  Other: None.  IMPRESSION:   Stable shotty lymphadenopathy throughout the neck, mediastinum,  abdomen, pelvis. No evidence of new or progressive disease.     Impression and Plan: This a pleasant 63 year old gentleman with the following issues: 1. Stage III follicular lymphoma. He is S/P 6 cycles of chemotherapy and tolerated it well overall without any major toxicities. CT scan performed on 02/04/2014 discuss today and continues to showe excellent response without evidence of relapse disease. The plan is to ontinue with active surveillance and treat him with chemotherapy with relapse with symptoms.    2. Atrial fibrillation.  He is normal sinus rhythm at this point, followed by cardiologist.   3. Follow-up. 6 months and repeat CT scan in 12 months. 4. Port management: We have agreed to remove the Port-A-Cath at this time and we will arrange for that in the near future.  [facility] 9/11/20152:09 PM

## 2014-02-11 ENCOUNTER — Other Ambulatory Visit: Payer: Self-pay | Admitting: Radiology

## 2014-02-11 ENCOUNTER — Encounter (HOSPITAL_COMMUNITY): Payer: Self-pay | Admitting: Pharmacy Technician

## 2014-02-13 ENCOUNTER — Encounter (HOSPITAL_COMMUNITY): Payer: Self-pay

## 2014-02-13 ENCOUNTER — Ambulatory Visit (HOSPITAL_COMMUNITY)
Admission: RE | Admit: 2014-02-13 | Discharge: 2014-02-13 | Disposition: A | Discharge status: Home / Self Care | Payer: BC Managed Care – PPO | Source: Ambulatory Visit | Attending: Oncology | Admitting: Oncology

## 2014-02-13 DIAGNOSIS — C8290 Follicular lymphoma, unspecified, unspecified site: Secondary | ICD-10-CM

## 2014-02-13 DIAGNOSIS — I10 Essential (primary) hypertension: Secondary | ICD-10-CM | POA: Insufficient documentation

## 2014-02-13 DIAGNOSIS — Z452 Encounter for adjustment and management of vascular access device: Secondary | ICD-10-CM | POA: Diagnosis not present

## 2014-02-13 DIAGNOSIS — Z7982 Long term (current) use of aspirin: Secondary | ICD-10-CM | POA: Insufficient documentation

## 2014-02-13 DIAGNOSIS — C8299 Follicular lymphoma, unspecified, extranodal and solid organ sites: Secondary | ICD-10-CM | POA: Insufficient documentation

## 2014-02-13 DIAGNOSIS — K219 Gastro-esophageal reflux disease without esophagitis: Secondary | ICD-10-CM | POA: Insufficient documentation

## 2014-02-13 DIAGNOSIS — I4891 Unspecified atrial fibrillation: Secondary | ICD-10-CM | POA: Insufficient documentation

## 2014-02-13 DIAGNOSIS — C8589 Other specified types of non-Hodgkin lymphoma, extranodal and solid organ sites: Secondary | ICD-10-CM

## 2014-02-13 LAB — CBC WITH DIFFERENTIAL/PLATELET
BASOS ABS: 0.1 10*3/uL (ref 0.0–0.1)
Basophils Relative: 1 % (ref 0–1)
EOS ABS: 0.3 10*3/uL (ref 0.0–0.7)
EOS PCT: 4 % (ref 0–5)
HCT: 39.2 % (ref 39.0–52.0)
Hemoglobin: 14.1 g/dL (ref 13.0–17.0)
LYMPHS ABS: 2.4 10*3/uL (ref 0.7–4.0)
Lymphocytes Relative: 34 % (ref 12–46)
MCH: 32.4 pg (ref 26.0–34.0)
MCHC: 36 g/dL (ref 30.0–36.0)
MCV: 90.1 fL (ref 78.0–100.0)
Monocytes Absolute: 0.7 10*3/uL (ref 0.1–1.0)
Monocytes Relative: 10 % (ref 3–12)
Neutro Abs: 3.8 10*3/uL (ref 1.7–7.7)
Neutrophils Relative %: 51 % (ref 43–77)
PLATELETS: 204 10*3/uL (ref 150–400)
RBC: 4.35 MIL/uL (ref 4.22–5.81)
RDW: 13 % (ref 11.5–15.5)
WBC: 7.3 10*3/uL (ref 4.0–10.5)

## 2014-02-13 LAB — PROTIME-INR
INR: 1.07 (ref 0.00–1.49)
PROTHROMBIN TIME: 13.9 s (ref 11.6–15.2)

## 2014-02-13 MED ORDER — VANCOMYCIN HCL IN DEXTROSE 1-5 GM/200ML-% IV SOLN
1000.0000 mg | Freq: Once | INTRAVENOUS | Status: AC
  Administered 2014-02-13: 1000 mg via INTRAVENOUS
  Filled 2014-02-13: qty 200

## 2014-02-13 MED ORDER — FENTANYL CITRATE 0.05 MG/ML IJ SOLN
INTRAMUSCULAR | Status: AC
  Filled 2014-02-13: qty 2

## 2014-02-13 MED ORDER — LIDOCAINE HCL 1 % IJ SOLN
INTRAMUSCULAR | Status: AC
  Filled 2014-02-13: qty 20

## 2014-02-13 MED ORDER — FENTANYL CITRATE 0.05 MG/ML IJ SOLN
INTRAMUSCULAR | Status: AC | PRN
  Administered 2014-02-13: 100 ug via INTRAVENOUS

## 2014-02-13 MED ORDER — SODIUM CHLORIDE 0.9 % IV SOLN
INTRAVENOUS | Status: DC
  Administered 2014-02-13: 13:00:00 via INTRAVENOUS

## 2014-02-13 MED ORDER — MIDAZOLAM HCL 2 MG/2ML IJ SOLN
INTRAMUSCULAR | Status: AC
  Filled 2014-02-13: qty 2

## 2014-02-13 MED ORDER — MIDAZOLAM HCL 2 MG/2ML IJ SOLN
INTRAMUSCULAR | Status: AC | PRN
  Administered 2014-02-13: 2 mg via INTRAVENOUS

## 2014-02-13 NOTE — H&P (Signed)
Agree.  Will remove port today.

## 2014-02-13 NOTE — Discharge Instructions (Signed)
Conscious Sedation, Adult, Care After Refer to this sheet in the next few weeks. These instructions provide you with information on caring for yourself after your procedure. Your health care provider may also give you more specific instructions. Your treatment has been planned according to current medical practices, but problems sometimes occur. Call your health care provider if you have any problems or questions after your procedure. WHAT TO EXPECT AFTER THE PROCEDURE  After your procedure:  You may feel sleepy, clumsy, and have poor balance for several hours.  Vomiting may occur if you eat too soon after the procedure. HOME CARE INSTRUCTIONS  Do not participate in any activities where you could become injured for at least 24 hours. Do not:  Drive.  Swim.  Ride a bicycle.  Operate heavy machinery.  Cook.  Use power tools.  Climb ladders.  Work from a high place.  Do not make important decisions or sign legal documents until you are improved.  If you vomit, drink water, juice, or soup when you can drink without vomiting. Make sure you have little or no nausea before eating solid foods.  Only take over-the-counter or prescription medicines for pain, discomfort, or fever as directed by your health care provider.  Make sure you and your family fully understand everything about the medicines given to you, including what side effects may occur.  You should not drink alcohol, take sleeping pills, or take medicines that cause drowsiness for at least 24 hours.  If you smoke, do not smoke without supervision.  If you are feeling better, you may resume normal activities 24 hours after you were sedated.  Keep all appointments with your health care provider. SEEK MEDICAL CARE IF:  Your skin is pale or bluish in color.  You continue to feel nauseous or vomit.  Your pain is getting worse and is not helped by medicine.  You have bleeding or swelling.  You are still sleepy or  feeling clumsy after 24 hours. SEEK IMMEDIATE MEDICAL CARE IF:  You develop a rash.  You have difficulty breathing.  You develop any type of allergic problem.  You have a fever. MAKE SURE YOU:  Understand these instructions.  Will watch your condition.  Will get help right away if you are not doing well or get worse. Document Released: 03/06/2013 Document Reviewed: 03/06/2013 [facility] Patient Information 2015 [place], [place]. This information is not intended to replace advice given to you by your health care provider. Make sure you discuss any questions you have with your health care provider.  Incision Care  An incision is a surgical cut to open your skin. You need to take care of your incision. This helps you to not get an infection. HOME CARE  Only take medicine as told by your doctor.  Do not take off your bandage (dressing) or get your incision wet until your doctor approves. Change the bandage and call your doctor if the bandage gets wet, dirty, or starts to smell.  Take showers. Do not take baths, swim, or do anything that may soak your incision until it heals.  Return to your normal diet and activities as told or allowed by your doctor.  Avoid lifting any weight until your doctor approves.  Put medicine that helps lessen itching on your incision as told by your doctor. Do not pick or scratch at your incision.  Keep your doctor visit to have your stitches (sutures) or staples removed.  Drink enough fluids to keep your pee (urine) clear or  pale yellow. GET HELP RIGHT AWAY IF:  You have a fever.  You have a rash.  You are dizzy, or you pass out (faint) while standing.  You have trouble breathing.  You have a reaction or side effects to medicine given to you.  You have redness, puffiness (swelling), or more pain in the incision and medicine does not help.  You have fluid, blood, or yellowish-white fluid (pus) coming from the incision lasting over 1 day.  You  have muscle aches, chills, or you feel sick.  You have a bad smell coming from the incision or bandage.  Your incision opens up after stitches, staples, or sticky strips have been removed.  You keep feeling sick to your stomach (nauseous) or keep throwing up (vomiting). MAKE SURE YOU:   Understand these instructions.  Will watch your condition.  Will get help right away if you are not doing well or get worse. Document Released: 08/08/2011 Document Reviewed: 07/10/2013 [facility] Patient Information 2015 [place]. This information is not intended to replace advice given to you by your health care provider. Make sure you discuss any questions you have with your health care provider.  May shower in 24-48 hours. Remove dressing prior to shower.

## 2014-02-13 NOTE — H&P (Signed)
Chief Complaint: "I'm getting my port out"   Referring Physician(s): Shadad,Firas N  History of Present Illness: Tom Fuller is a 63 y.o. male with history of stage lll follicular lymphoma, currently in remission, who presents today for port a cath removal.  Past Medical History  Diagnosis Date  . Atrial fibrillation   . Lymphoma, follicular     Dr.Shadad  . Cervical lymphadenopathy   . GERD (gastroesophageal reflux disease)   . Erectile dysfunction   . Hypertension     Past Surgical History  Procedure Laterality Date  . Vasectomy    . A bsc    . Abscess drainage      under rt eye   . Deep neck lymph node biopsy / excision  2011    Allergies: Ampicillin  Medications: Prior to Admission medications   Medication Sig Start Date End Date Taking? Authorizing Provider  aspirin 325 MG EC tablet Take 325 mg by mouth daily.     Yes Historical Provider, MD  esomeprazole (NEXIUM) 40 MG capsule Take 40 mg by mouth daily at 12 noon.   Yes Historical Provider, MD  fish oil-omega-3 fatty acids 1000 MG capsule Take 2 g by mouth 2 (two) times daily.     Yes Historical Provider, MD  metoprolol succinate (TOPROL-XL) 50 MG 24 hr tablet Take 50 mg by mouth every morning. Take with or immediately following a meal.   Yes Historical Provider, MD  valsartan-hydrochlorothiazide (DIOVAN-HCT) 320-12.5 MG per tablet Take 1 tablet by mouth every morning.   Yes Historical Provider, MD  celecoxib (CELEBREX) 200 MG capsule Take 200 mg by mouth daily as needed for mild pain.     Historical Provider, MD    Family History  Problem Relation Age of Onset  . Hypertension Mother   . Kidney disease Mother   . Hypertension Father   . Hypertension Brother   . Kidney disease Maternal Aunt   . Kidney disease Maternal Grandmother   . Diabetes Paternal Grandfather   . Hypertension Paternal Grandfather     History   Social History  . Marital Status: Married    Spouse Name: N/A    Number of  Children: N/A  . Years of Education: N/A   Social History Main Topics  . Smoking status: Never Smoker   . Smokeless tobacco: Never Used  . Alcohol Use: No  . Drug Use: No  . Sexual Activity: Yes    Partners: Female   Other Topics Concern  . None   Social History Narrative  . None        Review of Systems    Constitutional: Negative for fever and chills.  Respiratory: Negative for cough and shortness of breath.   Cardiovascular: Negative for chest pain.  Gastrointestinal: Negative for nausea, vomiting, abdominal pain and blood in stool.  Genitourinary: Negative for hematuria.  Musculoskeletal: Negative for back pain.  Neurological: Negative for headaches.  Hematological: Does not bruise/bleed easily.    Vital Signs: BP 132/82  Pulse 92  Temp(Src) 98.7 F (37.1 C) (Oral)  Resp 16  SpO2 99%  Physical Exam  Constitutional: He is oriented to person, place, and time. He appears well-developed and well-nourished.  Cardiovascular: Normal rate and regular rhythm.   Pulmonary/Chest: Effort normal.  Sl distant BS bases; rt chest wall PAC  Abdominal: Soft. Bowel sounds are normal.  Musculoskeletal: Normal range of motion.  Neurological: He is alert and oriented to person, place, and time.    Imaging:  Ct Soft Tissue Neck W Contrast  02/04/2014   CLINICAL DATA:  Followup follicular lymphoma.  EXAM: CT OF THE NECK WITH CONTRAST  CT OF THE CHEST WITH CONTRAST  CT OF THE ABDOMEN AND PELVIS WITH CONTRAST  TECHNIQUE: Multidetector CT imaging of the neck was performed with intravenous contrast.; Multidetector CT imaging of the abdomen and pelvis was performed following the standard protocol during bolus administration of intravenous contrast.; Multidetector CT imaging of the chest was performed following the standard protocol during bolus administration of intravenous contrast.  CONTRAST:  122mL OMNIPAQUE IOHEXOL 300 MG/ML  SOLN  COMPARISON:  02/11/2013  FINDINGS: CT NECK FINDINGS   Numerous small less than 1 cm lymph nodes are seen throughout the jugular chains bilaterally, and in the bilateral submandibular and submental regions. These show no significant change in size or number since previous exam. No new or increased areas of lymphadenopathy are identified within the neck. No other soft tissue masses are identified.  CT CHEST FINDINGS  Mediastinum/Hilar Regions: Small less than 1 cm mediastinal lymph nodes are seen which are stable. Index lymph node in the right paratracheal region on image 11 measures 9 mm. No hilar lymphadenopathy identified.  Other Thoracic Lymphadenopathy:  None.  Lungs: Few tiny sub-cm right lung nodules remain stable. No new or enlarging pulmonary nodules or masses are identified. No evidence of pulmonary infiltrate or central endobronchial lesion.  Pleura:  No evidence of effusion or mass.  Vascular/Cardiac: No thoracic aortic aneurysm or other significant abnormality identified.  Musculoskeletal:  No suspicious bone lesions identified.  Other:  None.  CT ABDOMEN AND PELVIS FINDINGS  Liver:  No mass or other parenchymal abnormality identified.  Gallbladder/Biliary:  Unremarkable.  Pancreas: No mass, inflammatory changes, or other parenchymal abnormality identified.  Spleen:  Within normal limits in size and appearance.  Adrenal Glands:  No mass identified.  Kidneys/Urinary Tract: No masses identified. No evidence of hydronephrosis.  Lymph Nodes: Small less than 1 cm lymph nodes are again seen throughout the small bowel mesentery with associated hazy mesenteric opacity. This remains stable. Mild retroperitoneal lymphadenopathy is also stable with largest lymph node in the left paraaortic space measuring 10 mm on image 79 of series 6. Pelvic lymphadenopathy in along both iliac lymph node chains is also stable with lymph nodes measuring less than 1 cm. Index lymph node in the left external iliac chain on image 108 measures 9 mm, without change.  Pelvic/Reproductive  Organs: No mass or other significant abnormality identified.  Bowel/Peritoneum: No evidence of bowel wall thickening, mass, or obstruction.  Vascular:  No evidence of abdominal aortic aneurysm.  Musculoskeletal:  No suspicious bone lesions identified.  Other:  None.  IMPRESSION: Stable shotty lymphadenopathy throughout the neck, mediastinum, abdomen, pelvis. No evidence of new or progressive disease.   Electronically Signed   By: Earle Gell [name]   On: 02/04/2014 11:53   Ct Chest W Contrast  02/04/2014   CLINICAL DATA:  Followup follicular lymphoma.  EXAM: CT OF THE NECK WITH CONTRAST  CT OF THE CHEST WITH CONTRAST  CT OF THE ABDOMEN AND PELVIS WITH CONTRAST  TECHNIQUE: Multidetector CT imaging of the neck was performed with intravenous contrast.; Multidetector CT imaging of the abdomen and pelvis was performed following the standard protocol during bolus administration of intravenous contrast.; Multidetector CT imaging of the chest was performed following the standard protocol during bolus administration of intravenous contrast.  CONTRAST:  129mL OMNIPAQUE IOHEXOL 300 MG/ML  SOLN  COMPARISON:  02/11/2013  FINDINGS:  CT NECK FINDINGS  Numerous small less than 1 cm lymph nodes are seen throughout the jugular chains bilaterally, and in the bilateral submandibular and submental regions. These show no significant change in size or number since previous exam. No new or increased areas of lymphadenopathy are identified within the neck. No other soft tissue masses are identified.  CT CHEST FINDINGS  Mediastinum/Hilar Regions: Small less than 1 cm mediastinal lymph nodes are seen which are stable. Index lymph node in the right paratracheal region on image 11 measures 9 mm. No hilar lymphadenopathy identified.  Other Thoracic Lymphadenopathy:  None.  Lungs: Few tiny sub-cm right lung nodules remain stable. No new or enlarging pulmonary nodules or masses are identified. No evidence of pulmonary infiltrate or central  endobronchial lesion.  Pleura:  No evidence of effusion or mass.  Vascular/Cardiac: No thoracic aortic aneurysm or other significant abnormality identified.  Musculoskeletal:  No suspicious bone lesions identified.  Other:  None.  CT ABDOMEN AND PELVIS FINDINGS  Liver:  No mass or other parenchymal abnormality identified.  Gallbladder/Biliary:  Unremarkable.  Pancreas: No mass, inflammatory changes, or other parenchymal abnormality identified.  Spleen:  Within normal limits in size and appearance.  Adrenal Glands:  No mass identified.  Kidneys/Urinary Tract: No masses identified. No evidence of hydronephrosis.  Lymph Nodes: Small less than 1 cm lymph nodes are again seen throughout the small bowel mesentery with associated hazy mesenteric opacity. This remains stable. Mild retroperitoneal lymphadenopathy is also stable with largest lymph node in the left paraaortic space measuring 10 mm on image 79 of series 6. Pelvic lymphadenopathy in along both iliac lymph node chains is also stable with lymph nodes measuring less than 1 cm. Index lymph node in the left external iliac chain on image 108 measures 9 mm, without change.  Pelvic/Reproductive Organs: No mass or other significant abnormality identified.  Bowel/Peritoneum: No evidence of bowel wall thickening, mass, or obstruction.  Vascular:  No evidence of abdominal aortic aneurysm.  Musculoskeletal:  No suspicious bone lesions identified.  Other:  None.  IMPRESSION: Stable shotty lymphadenopathy throughout the neck, mediastinum, abdomen, pelvis. No evidence of new or progressive disease.   Electronically Signed   By: Earle Gell [name]   On: 02/04/2014 11:53   Ct Abdomen Pelvis W Contrast  02/04/2014   CLINICAL DATA:  Followup follicular lymphoma.  EXAM: CT OF THE NECK WITH CONTRAST  CT OF THE CHEST WITH CONTRAST  CT OF THE ABDOMEN AND PELVIS WITH CONTRAST  TECHNIQUE: Multidetector CT imaging of the neck was performed with intravenous contrast.; Multidetector CT  imaging of the abdomen and pelvis was performed following the standard protocol during bolus administration of intravenous contrast.; Multidetector CT imaging of the chest was performed following the standard protocol during bolus administration of intravenous contrast.  CONTRAST:  14mL OMNIPAQUE IOHEXOL 300 MG/ML  SOLN  COMPARISON:  02/11/2013  FINDINGS: CT NECK FINDINGS  Numerous small less than 1 cm lymph nodes are seen throughout the jugular chains bilaterally, and in the bilateral submandibular and submental regions. These show no significant change in size or number since previous exam. No new or increased areas of lymphadenopathy are identified within the neck. No other soft tissue masses are identified.  CT CHEST FINDINGS  Mediastinum/Hilar Regions: Small less than 1 cm mediastinal lymph nodes are seen which are stable. Index lymph node in the right paratracheal region on image 11 measures 9 mm. No hilar lymphadenopathy identified.  Other Thoracic Lymphadenopathy:  None.  Lungs: Few tiny  sub-cm right lung nodules remain stable. No new or enlarging pulmonary nodules or masses are identified. No evidence of pulmonary infiltrate or central endobronchial lesion.  Pleura:  No evidence of effusion or mass.  Vascular/Cardiac: No thoracic aortic aneurysm or other significant abnormality identified.  Musculoskeletal:  No suspicious bone lesions identified.  Other:  None.  CT ABDOMEN AND PELVIS FINDINGS  Liver:  No mass or other parenchymal abnormality identified.  Gallbladder/Biliary:  Unremarkable.  Pancreas: No mass, inflammatory changes, or other parenchymal abnormality identified.  Spleen:  Within normal limits in size and appearance.  Adrenal Glands:  No mass identified.  Kidneys/Urinary Tract: No masses identified. No evidence of hydronephrosis.  Lymph Nodes: Small less than 1 cm lymph nodes are again seen throughout the small bowel mesentery with associated hazy mesenteric opacity. This remains stable. Mild  retroperitoneal lymphadenopathy is also stable with largest lymph node in the left paraaortic space measuring 10 mm on image 79 of series 6. Pelvic lymphadenopathy in along both iliac lymph node chains is also stable with lymph nodes measuring less than 1 cm. Index lymph node in the left external iliac chain on image 108 measures 9 mm, without change.  Pelvic/Reproductive Organs: No mass or other significant abnormality identified.  Bowel/Peritoneum: No evidence of bowel wall thickening, mass, or obstruction.  Vascular:  No evidence of abdominal aortic aneurysm.  Musculoskeletal:  No suspicious bone lesions identified.  Other:  None.  IMPRESSION: Stable shotty lymphadenopathy throughout the neck, mediastinum, abdomen, pelvis. No evidence of new or progressive disease.   Electronically Signed   By: Earle Gell [name]   On: 02/04/2014 11:53    Labs: Lab Results  Component Value Date   WBC 7.3 02/13/2014   HCT 39.2 02/13/2014   MCV 90.1 02/13/2014   PLT 204 02/13/2014   NA 139 02/04/2014   K 4.4 02/04/2014   CL 107 11/09/2012   CO2 29 02/04/2014   GLUCOSE 136 02/04/2014   BUN 10.7 02/04/2014   CREATININE 1.0 02/04/2014   CALCIUM 9.4 02/04/2014   PROT 6.8 02/04/2014   ALBUMIN 3.6 02/04/2014   AST 47* 02/04/2014   ALT 62* 02/04/2014   ALKPHOS 72 02/04/2014   BILITOT 0.57 02/04/2014   GFRNONAA 88* 08/19/2011   GFRAA >90 08/19/2011   INR 1.07 02/13/2014    Assessment and Plan: KATSUMI WISLER is a 63 y.o. male with history of stage lll follicular lymphoma, currently in remission, who presents today for port a cath removal. Details/risks of procedure d/w pt with his understanding and consent.   Rowe Robert, PAC

## 2014-02-13 NOTE — Procedures (Signed)
Procedure:  Port removal Findings:  Right chest port successfully removed.  No complications.

## 2014-03-14 ENCOUNTER — Other Ambulatory Visit: Payer: Self-pay

## 2014-03-27 ENCOUNTER — Other Ambulatory Visit: Payer: Self-pay

## 2014-03-27 MED ORDER — METOPROLOL SUCCINATE ER 50 MG PO TB24: 50.0000 mg | ORAL_TABLET | Freq: Every morning | ORAL | Status: DC

## 2014-04-23 ENCOUNTER — Ambulatory Visit (INDEPENDENT_AMBULATORY_CARE_PROVIDER_SITE_OTHER): Payer: BC Managed Care – PPO | Admitting: Internal Medicine

## 2014-04-23 ENCOUNTER — Encounter: Payer: Self-pay | Admitting: Internal Medicine

## 2014-04-23 VITALS — BP 110/80 | HR 84 | Ht 70.0 in | Wt 264.2 lb

## 2014-04-23 DIAGNOSIS — I10 Essential (primary) hypertension: Secondary | ICD-10-CM

## 2014-04-23 DIAGNOSIS — I480 Paroxysmal atrial fibrillation: Secondary | ICD-10-CM

## 2014-04-23 DIAGNOSIS — I482 Chronic atrial fibrillation, unspecified: Secondary | ICD-10-CM

## 2014-04-23 NOTE — Assessment & Plan Note (Signed)
We discussed the importance of weight loss. He admits to dietary indiscretion. He will try to lose weight by eating less and exercising more.

## 2014-04-23 NOTE — Assessment & Plan Note (Signed)
>>  ASSESSMENT AND PLAN FOR ESSENTIAL HYPERTENSION WRITTEN ON 04/23/2014  5:46 PM BY Evans Lance, MD  His blood pressure is well controlled. He will continue his antihypertensive medications, and I've encouraged the patient to lose weight, and maintain a low-sodium diet.

## 2014-04-23 NOTE — Assessment & Plan Note (Signed)
His blood pressure is well controlled. He will continue his antihypertensive medications, and I've encouraged the patient to lose weight, and maintain a low-sodium diet.

## 2014-04-23 NOTE — Assessment & Plan Note (Signed)
He states that his cancer is in remission. He will follow-up with our oncologist.

## 2014-04-23 NOTE — Progress Notes (Signed)
HPI [name] returns for followup. He is a pleasant 63 yo man with morbid obesity, chronic atrial fibrillation, hypertension, who returns today for follow-up. In the interim, he has done well except for difficulty losing weight. He denies chest pain or shortness of breath. He has chronic but mild peripheral edema. He is trying to avoid sodium indiscretion. He was diagnosed with lymphoma, had it treated, and now appears to be in remission. He remains working a regular job. He has not had syncope. Allergies  Allergen Reactions  . Ampicillin     rash     Current Outpatient Prescriptions  Medication Sig Dispense Refill  . aspirin 325 MG EC tablet Take 325 mg by mouth daily.      . celecoxib (CELEBREX) 200 MG capsule Take 200 mg by mouth daily as needed for mild pain.     . fish oil-omega-3 fatty acids 1000 MG capsule Take 2 g by mouth 2 (two) times daily.      . metoprolol succinate (TOPROL-XL) 50 MG 24 hr tablet Take 1 tablet (50 mg total) by mouth every morning. Take with or immediately following a meal. 90 tablet 1  . valsartan-hydrochlorothiazide (DIOVAN-HCT) 320-12.5 MG per tablet Take 1 tablet by mouth every morning.     No current facility-administered medications for this visit.     Past Medical History  Diagnosis Date  . Atrial fibrillation   . Lymphoma, follicular     Dr.Shadad  . Cervical lymphadenopathy   . GERD (gastroesophageal reflux disease)   . Erectile dysfunction   . Hypertension     ROS:   All systems reviewed and negative except as noted in the HPI.   Past Surgical History  Procedure Laterality Date  . Vasectomy    . A bsc    . Abscess drainage      under rt eye   . Deep neck lymph node biopsy / excision  2011     Family History  Problem Relation Age of Onset  . Hypertension Mother   . Kidney disease Mother   . Hypertension Father   . Hypertension Brother   . Kidney disease Maternal Aunt   . Kidney disease Maternal Grandmother   .  Diabetes Paternal Grandfather   . Hypertension Paternal Grandfather      History   Social History  . Marital Status: Married    Spouse Name: N/A    Number of Children: N/A  . Years of Education: N/A   Occupational History  . Not on file.   Social History Main Topics  . Smoking status: Never Smoker   . Smokeless tobacco: Never Used  . Alcohol Use: No  . Drug Use: No  . Sexual Activity:    Partners: Female   Other Topics Concern  . Not on file   Social History Narrative     BP 110/80 mmHg  Pulse 84  Ht 5\' 10"  (1.778 m)  Wt 264 lb 3.2 oz (119.84 kg)  BMI 37.91 kg/m2  Physical Exam:  Well appearing 63yo man, NAD HEENT: Unremarkable Neck:  No JVD, no thyromegally Back:  No CVA tenderness Lungs:  Clear with no wheezes, rales, or rhonchi  HEART:  IRegular rate rhythm, no murmurs, no rubs, no clicks Abd:  soft, positive bowel sounds, no organomegally, no rebound, no guarding Ext:  2 plus pulses, no edema, no cyanosis, no clubbing Skin:  No rashes no nodules Neuro:  CN II through XII intact, motor grossly intact  EKG - atrial fib with a controlled VR   Assess/Plan:

## 2014-04-23 NOTE — Patient Instructions (Signed)
Your physician wants you to follow-up in: 1 year with Dr. [name]. You will receive a reminder letter in the mail two months in advance. If you don't receive a letter, please call our office to schedule the follow-up appointment.  Your physician recommends that you continue on your current medications as directed. Please refer to the Current Medication list given to you today.

## 2014-04-23 NOTE — Assessment & Plan Note (Signed)
His ventricular rate is well controlled. He is on medical therapy, and will remain on a strategy of rate control. His stroke risk is low. He will continue aspirin for now.

## 2014-04-28 ENCOUNTER — Other Ambulatory Visit: Payer: Self-pay | Admitting: Internal Medicine

## 2014-08-13 ENCOUNTER — Encounter: Payer: Self-pay | Admitting: *Deleted

## 2014-08-13 ENCOUNTER — Ambulatory Visit (HOSPITAL_BASED_OUTPATIENT_CLINIC_OR_DEPARTMENT_OTHER): Payer: BLUE CROSS/BLUE SHIELD | Admitting: Oncology

## 2014-08-13 ENCOUNTER — Telehealth: Payer: Self-pay | Admitting: Oncology

## 2014-08-13 ENCOUNTER — Other Ambulatory Visit (HOSPITAL_BASED_OUTPATIENT_CLINIC_OR_DEPARTMENT_OTHER): Payer: BLUE CROSS/BLUE SHIELD

## 2014-08-13 VITALS — BP 153/77 | HR 94 | Temp 98.8°F | Resp 18 | Ht 70.0 in | Wt 267.6 lb

## 2014-08-13 DIAGNOSIS — C8290 Follicular lymphoma, unspecified, unspecified site: Secondary | ICD-10-CM

## 2014-08-13 DIAGNOSIS — I4891 Unspecified atrial fibrillation: Secondary | ICD-10-CM

## 2014-08-13 DIAGNOSIS — C8590 Non-Hodgkin lymphoma, unspecified, unspecified site: Secondary | ICD-10-CM

## 2014-08-13 DIAGNOSIS — C8599 Non-Hodgkin lymphoma, unspecified, extranodal and solid organ sites: Secondary | ICD-10-CM

## 2014-08-13 LAB — CBC WITH DIFFERENTIAL/PLATELET
BASO%: 1 % (ref 0.0–2.0)
Basophils Absolute: 0.1 10*3/uL (ref 0.0–0.1)
EOS%: 5.5 % (ref 0.0–7.0)
Eosinophils Absolute: 0.5 10*3/uL (ref 0.0–0.5)
HCT: 42.5 % (ref 38.4–49.9)
HGB: 14.3 g/dL (ref 13.0–17.1)
LYMPH%: 28.8 % (ref 14.0–49.0)
MCH: 32.1 pg (ref 27.2–33.4)
MCHC: 33.8 g/dL (ref 32.0–36.0)
MCV: 95.2 fL (ref 79.3–98.0)
MONO#: 0.9 10*3/uL (ref 0.1–0.9)
MONO%: 9.7 % (ref 0.0–14.0)
NEUT#: 5 10*3/uL (ref 1.5–6.5)
NEUT%: 55 % (ref 39.0–75.0)
Platelets: 226 10*3/uL (ref 140–400)
RBC: 4.46 10*6/uL (ref 4.20–5.82)
RDW: 13.6 % (ref 11.0–14.6)
WBC: 9.2 10*3/uL (ref 4.0–10.3)
lymph#: 2.6 10*3/uL (ref 0.9–3.3)

## 2014-08-13 LAB — COMPREHENSIVE METABOLIC PANEL (CC13)
ALT: 58 U/L — ABNORMAL HIGH (ref 0–55)
AST: 42 U/L — ABNORMAL HIGH (ref 5–34)
Albumin: 3.8 g/dL (ref 3.5–5.0)
Alkaline Phosphatase: 73 U/L (ref 40–150)
Anion Gap: 9 mEq/L (ref 3–11)
BUN: 15.9 mg/dL (ref 7.0–26.0)
CO2: 28 mEq/L (ref 22–29)
Calcium: 9.6 mg/dL (ref 8.4–10.4)
Chloride: 102 mEq/L (ref 98–109)
Creatinine: 1 mg/dL (ref 0.7–1.3)
EGFR: 83 mL/min/{1.73_m2} — ABNORMAL LOW (ref >90)
Glucose: 195 mg/dl — ABNORMAL HIGH (ref 70–140)
Potassium: 3.9 mEq/L (ref 3.5–5.1)
Sodium: 139 mEq/L (ref 136–145)
Total Bilirubin: 0.88 mg/dL (ref 0.20–1.20)
Total Protein: 6.8 g/dL (ref 6.4–8.3)

## 2014-08-13 NOTE — Telephone Encounter (Signed)
Gave avs & calendar for September °

## 2014-08-13 NOTE — Progress Notes (Signed)
Hematology and Oncology Follow Up Visit  Tom Fuller [id number] [birth year] 64 y.o. 08/13/2014 3:10 PM    CC: Tom Fuller. [name], MD  [name], [name]  [name], [name]: A 64 year old gentleman with stage IIIA low-grade follicular lymphoma diagnosed May 2010.  Past Therapy:  He is S/P Bendamustine and rituximab started on 08/25/11 for 6 cycles completed in 12/2011.  Interim History:  Tom Fuller presents today for a followup visit. Since his last visit, he continues to do very well. He continues to work full time without any decline. He denies any lymphadenopathy in the neck, axilla or inguinal area. He denies fever, chest pain, shortness of breath. No vomiting reported at this time.  Appetite remains normal. He reports no recent illness or hospitalizations. He denies any headaches, blurry vision or syncope. He did not report any chest pain, shortness of breath or cough. He does not report any nausea, change in his bowel habits. The remaining review of systems unremarkable.  Medications: I have reviewed the patient's current medications.  Current Outpatient Prescriptions  Medication Sig Dispense Refill  . aspirin 325 MG EC tablet Take 325 mg by mouth daily.      . celecoxib (CELEBREX) 200 MG capsule Take 200 mg by mouth daily as needed for mild pain.     . fish oil-omega-3 fatty acids 1000 MG capsule Take 2 g by mouth 2 (two) times daily.      . metoprolol succinate (TOPROL-XL) 50 MG 24 hr tablet Take 1 tablet (50 mg total) by mouth every morning. Take with or immediately following a meal. 90 tablet 1  . valsartan-hydrochlorothiazide (DIOVAN-HCT) 320-12.5 MG per tablet Take 1 tablet by mouth every morning.    . valsartan-hydrochlorothiazide (DIOVAN-HCT) 320-12.5 MG per tablet TAKE 1 TABLET EVERY DAY 90 tablet 3   No current facility-administered medications for this visit.    Allergies:  Allergies  Allergen Reactions  . Ampicillin     rash    Past Medical  History, Surgical history, Social history, and Family History were reviewed and updated.    Physical Exam: Blood pressure 153/77, pulse 94, temperature 98.8 F (37.1 C), temperature source Oral, resp. rate 18, height 5\' 10"  (1.778 m), weight 267 lb 9.6 oz (121.383 kg), SpO2 98 %. ECOG: 0 General appearance: alert, awake not in any distress. Head: Normocephalic, without obvious abnormality Neck: no adenopathy Lymph nodes: I cannot appreciate any cervical adenopathy today. Heart: Irregular without murmurs. Lung:chest clear, no wheezing, rales, normal symmetric air entry,  Abdomen: soft, non-tender, without masses or organomegaly Skin: noted erythema and induration in the paraumbilical area.  EXT:no erythema, induration, or nodules  Lab Results: Lab Results  Component Value Date   WBC 9.2 08/13/2014   HGB 14.3 08/13/2014   HCT 42.5 08/13/2014   MCV 95.2 08/13/2014   PLT 226 08/13/2014     Impression and Plan: This a pleasant 64 year old gentleman with the following issues: 1. Stage III follicular lymphoma. He is S/P 6 cycles of chemotherapy concluded in March 2013 and tolerated it well overall without any major toxicities. CT scan performed on 02/04/2014 continue to show excellent response without evidence of relapse disease. The plan is to ontinue with active surveillance and treat him with chemotherapy with relapse with symptoms.    2. Atrial fibrillation. Follows up with cardiology. His rate is controlled although he is irregular at times. 3. Port management: Port-A-Cath removed in September 2015. 4. Follow-up: Will be in 6 months  after a repeat CT scan for staging purposes.  [id number] 3/16/20163:10 PM

## 2014-09-18 ENCOUNTER — Other Ambulatory Visit: Payer: Self-pay | Admitting: Internal Medicine

## 2015-02-11 ENCOUNTER — Ambulatory Visit (HOSPITAL_COMMUNITY)
Admission: RE | Admit: 2015-02-11 | Discharge: 2015-02-11 | Disposition: A | Discharge status: Home / Self Care | Payer: BLUE CROSS/BLUE SHIELD | Source: Ambulatory Visit | Attending: Oncology | Admitting: Oncology

## 2015-02-11 ENCOUNTER — Other Ambulatory Visit (HOSPITAL_BASED_OUTPATIENT_CLINIC_OR_DEPARTMENT_OTHER): Payer: BLUE CROSS/BLUE SHIELD

## 2015-02-11 ENCOUNTER — Encounter (HOSPITAL_COMMUNITY): Payer: Self-pay

## 2015-02-11 DIAGNOSIS — C8290 Follicular lymphoma, unspecified, unspecified site: Secondary | ICD-10-CM | POA: Insufficient documentation

## 2015-02-11 DIAGNOSIS — C8590 Non-Hodgkin lymphoma, unspecified, unspecified site: Secondary | ICD-10-CM | POA: Diagnosis not present

## 2015-02-11 LAB — CBC WITH DIFFERENTIAL/PLATELET
BASO%: 1.1 % (ref 0.0–2.0)
Basophils Absolute: 0.1 10*3/uL (ref 0.0–0.1)
EOS%: 5.6 % (ref 0.0–7.0)
Eosinophils Absolute: 0.5 10*3/uL (ref 0.0–0.5)
HCT: 41.2 % (ref 38.4–49.9)
HEMOGLOBIN: 14.2 g/dL (ref 13.0–17.1)
LYMPH%: 28.3 % (ref 14.0–49.0)
MCH: 32.8 pg (ref 27.2–33.4)
MCHC: 34.6 g/dL (ref 32.0–36.0)
MCV: 94.8 fL (ref 79.3–98.0)
MONO#: 0.8 10*3/uL (ref 0.1–0.9)
MONO%: 9.5 % (ref 0.0–14.0)
NEUT%: 55.5 % (ref 39.0–75.0)
NEUTROS ABS: 4.9 10*3/uL (ref 1.5–6.5)
Platelets: 224 10*3/uL (ref 140–400)
RBC: 4.35 10*6/uL (ref 4.20–5.82)
RDW: 13.6 % (ref 11.0–14.6)
WBC: 8.9 10*3/uL (ref 4.0–10.3)
lymph#: 2.5 10*3/uL (ref 0.9–3.3)

## 2015-02-11 LAB — COMPREHENSIVE METABOLIC PANEL (CC13)
ALBUMIN: 3.9 g/dL (ref 3.5–5.0)
ALK PHOS: 79 U/L (ref 40–150)
ALT: 53 U/L (ref 0–55)
AST: 37 U/L — ABNORMAL HIGH (ref 5–34)
Anion Gap: 7 mEq/L (ref 3–11)
BILIRUBIN TOTAL: 0.91 mg/dL (ref 0.20–1.20)
BUN: 16 mg/dL (ref 7.0–26.0)
CO2: 29 mEq/L (ref 22–29)
Calcium: 9.8 mg/dL (ref 8.4–10.4)
Chloride: 102 mEq/L (ref 98–109)
Creatinine: 1 mg/dL (ref 0.7–1.3)
EGFR: 85 mL/min/{1.73_m2} — ABNORMAL LOW (ref >90)
GLUCOSE: 163 mg/dL — AB (ref 70–140)
POTASSIUM: 4 meq/L (ref 3.5–5.1)
SODIUM: 138 meq/L (ref 136–145)
TOTAL PROTEIN: 6.8 g/dL (ref 6.4–8.3)

## 2015-02-11 MED ORDER — IOHEXOL 300 MG/ML  SOLN
100.0000 mL | Freq: Once | INTRAMUSCULAR | Status: AC | PRN
  Administered 2015-02-11: 100 mL via INTRAVENOUS

## 2015-02-13 ENCOUNTER — Other Ambulatory Visit: Payer: BC Managed Care – PPO

## 2015-02-13 ENCOUNTER — Ambulatory Visit (HOSPITAL_BASED_OUTPATIENT_CLINIC_OR_DEPARTMENT_OTHER): Payer: BLUE CROSS/BLUE SHIELD | Admitting: Oncology

## 2015-02-13 ENCOUNTER — Telehealth: Payer: Self-pay | Admitting: Oncology

## 2015-02-13 ENCOUNTER — Ambulatory Visit: Payer: BC Managed Care – PPO | Admitting: Oncology

## 2015-02-13 VITALS — BP 130/57 | HR 87 | Temp 98.5°F | Resp 18 | Ht 70.0 in | Wt 261.1 lb

## 2015-02-13 DIAGNOSIS — C8590 Non-Hodgkin lymphoma, unspecified, unspecified site: Secondary | ICD-10-CM | POA: Diagnosis not present

## 2015-02-13 NOTE — Progress Notes (Signed)
Hematology and Oncology Follow Up Visit  Tom Fuller [id number] Sep 24, 1950 64 y.o. 02/13/2015 3:29 PM    CC: Tom Fuller. Tom Le, MD  Tom Fuller, [name]  Tom Chessman, [name]: A 64 year old gentleman with stage IIIA low-grade follicular lymphoma diagnosed May 2010.  Past Therapy:  He is S/P Bendamustine and rituximab started on 08/25/11 for 6 cycles completed in 12/2011.  Interim History:  Tom Fuller presents today for a followup visit. Since his last visit, he reports no complaints. He does not report any recent hospitalization or illnesses. He is able to perform activities of daily living without any decline. He continues to work full time without any interruption. He denies any lymphadenopathy in the neck, axilla or inguinal area.   He denies fever, chest pain, shortness of breath. No vomiting reported at this time.  Appetite remains normal. He denies any headaches, blurry vision or syncope. He did not report any chest pain, shortness of breath or cough. He does not report any nausea, change in his bowel habits. The remaining review of systems unremarkable.  Medications: I have reviewed the patient's current medications.  Current Outpatient Prescriptions  Medication Sig Dispense Refill  . aspirin 325 MG EC tablet Take 325 mg by mouth daily.      . celecoxib (CELEBREX) 200 MG capsule Take 200 mg by mouth daily as needed for mild pain.     . fish oil-omega-3 fatty acids 1000 MG capsule Take 2 g by mouth 2 (two) times daily.      . metoprolol succinate (TOPROL-XL) 50 MG 24 hr tablet TAKE 1 TABLET (50 MG TOTAL) BY MOUTH EVERY MORNING. TAKE WITH OR IMMEDIATELY FOLLOWING A MEAL. 90 tablet 1  . valsartan-hydrochlorothiazide (DIOVAN-HCT) 320-12.5 MG per tablet Take 1 tablet by mouth every morning.    . valsartan-hydrochlorothiazide (DIOVAN-HCT) 320-12.5 MG per tablet TAKE 1 TABLET EVERY DAY 90 tablet 3   No current facility-administered medications for this visit.     Allergies:  Allergies  Allergen Reactions  . Ampicillin     rash    Past Medical History, Surgical history, Social history, and Family History were reviewed and updated.    Physical Exam: Blood pressure 130/57, pulse 87, temperature 98.5 F (36.9 C), temperature source Oral, resp. rate 18, height 5\' 10"  (1.778 m), weight 261 lb 1.6 oz (118.434 kg), SpO2 98 %. ECOG: 0 General appearance: alert, awake gentleman without distress. Head: Normocephalic, without obvious abnormality Neck: no adenopathy Lymph nodes: No cervical, inguinal or axillary adenopathy. Heart: Irregular without murmurs. Lung:chest clear, no wheezing, rales, normal symmetric air entry,  Abdomen: soft, non-tender, without masses or organomegaly Skin: noted erythema and induration in the paraumbilical area.  EXT:no erythema, induration, or nodules  Lab Results: Lab Results  Component Value Date   WBC 8.9 02/11/2015   HGB 14.2 02/11/2015   HCT 41.2 02/11/2015   MCV 94.8 02/11/2015   PLT 224 02/11/2015   EXAM: CT CHEST, ABDOMEN, AND PELVIS WITH CONTRAST  TECHNIQUE: Multidetector CT imaging of the chest, abdomen and pelvis was performed following the standard protocol during bolus administration of intravenous contrast.  CONTRAST: 191mL OMNIPAQUE IOHEXOL 300 MG/ML SOLN  COMPARISON: CT scan 02/04/2014  FINDINGS: CT CHEST FINDINGS  Chest wall: No chest wall mass, supraclavicular or axillary lymphadenopathy. Small scattered lymph nodes are stable. Small thyroid gland nodules are stable. The bony thorax is intact. No destructive bone lesions or spinal canal compromise.  Mediastinum: The heart is normal in size and  stable. No pericardial effusion. Mild tortuosity of the thoracic aorta but no aneurysm or dissection. The branch vessels are patent. The esophagus is grossly normal. There are small scattered stable mediastinal and hilar lymph nodes.  8.5 mm right paratracheal node on image 9  is unchanged.  7 mm subcarinal node on image 24 is unchanged. 10 mm retroesophageal lymph node on image 20 measures 10.5 mm and previously measured 9.5 mm.  Lungs/pleura: No acute pulmonary findings. No worrisome pulmonary lesions. No pleural effusion.  CT ABDOMEN AND PELVIS FINDINGS  Hepatobiliary: No focal hepatic lesions or intrahepatic biliary dilatation. The gallbladder is normal. No common bile duct dilatation.  Pancreas: Mild fatty change involving the pancreas but no mass, inflammation or ductal dilatation.  Spleen: Normal size. No focal lesions.  Adrenals/Urinary Tract: The adrenal glands are normal and stable.  Stable small left renal cyst. No worrisome renal lesions or hydronephrosis.  Stomach/Bowel: The stomach, duodenum, small bowel and colon are unremarkable. No inflammatory changes, mass lesions or obstructive findings.  Vascular/Lymphatic: Persistent hazy appearance of the small bowel mesenteric with numerous small mesenteric lymph nodes. Index lymph node on image number 61 measures 10 mm and is unchanged. Stable retroperitoneal lymphadenopathy. Index node on image number 70 measures 19 x 12 mm and previously measured 18 x 13 mm. Left-sided iliac lymph node on image number 92 measures 9 mm and is stable.  Other: The bladder, prostate gland and seminal vesicles are unremarkable. Small scattered pelvic lymph nodes are stable. No inguinal adenopathy.  Musculoskeletal: No significant bony findings.  IMPRESSION: 1. Stable small scattered mediastinal lymph nodes, mesenteric lymph nodes and retroperitoneal lymph nodes. No findings for progressive adenopathy or new adenopathy. 2. No acute pulmonary findings. 3. No acute abdominal/pelvic findings or mass lesions and no organomegaly.    Impression and Plan: This a pleasant 64 year old gentleman with the following issues: 1. Stage III follicular lymphoma. He is S/P 6 cycles of chemotherapy  concluded in March 2013 and tolerated it well overall without any major toxicities. CT scans performed on 02/11/2015 was reviewed today and continue to show excellent response without evidence of relapse disease. The plan is to ontinue with active surveillance and treat him with chemotherapy with relapse with symptoms. I plan to continue physical examination and laboratory testing every 6 months and repeat imaging studies in 12 months.   2. Atrial fibrillation. Follows up with cardiology. His rate is controlled although he is irregular at times. 3. Port management: Port-A-Cath removed in September 2015. 4. Follow-up: Will be in 6 months.   [facility] 9/16/20163:29 PM

## 2015-02-13 NOTE — Telephone Encounter (Signed)
Pt confirmed labs/ov per 09/16 POF, gave pt AVS and Calendar... KJ °

## 2015-03-16 ENCOUNTER — Other Ambulatory Visit: Payer: Self-pay | Admitting: Internal Medicine

## 2015-03-19 ENCOUNTER — Other Ambulatory Visit: Payer: Self-pay | Admitting: Internal Medicine

## 2015-04-24 ENCOUNTER — Other Ambulatory Visit: Payer: Self-pay | Admitting: Internal Medicine

## 2015-06-09 ENCOUNTER — Encounter: Payer: Self-pay | Admitting: Internal Medicine

## 2015-06-09 ENCOUNTER — Ambulatory Visit (INDEPENDENT_AMBULATORY_CARE_PROVIDER_SITE_OTHER): Payer: BLUE CROSS/BLUE SHIELD | Admitting: Internal Medicine

## 2015-06-09 VITALS — BP 130/88 | HR 86 | Ht 70.0 in | Wt 251.2 lb

## 2015-06-09 DIAGNOSIS — I480 Paroxysmal atrial fibrillation: Secondary | ICD-10-CM | POA: Diagnosis not present

## 2015-06-09 DIAGNOSIS — I10 Essential (primary) hypertension: Secondary | ICD-10-CM

## 2015-06-09 MED ORDER — METOPROLOL SUCCINATE ER 50 MG PO TB24: ORAL_TABLET | ORAL | Status: DC

## 2015-06-09 NOTE — Assessment & Plan Note (Signed)
His VR is a little high. I have recommended he uptitrate his beta blocker.

## 2015-06-09 NOTE — Assessment & Plan Note (Signed)
His weight remains high. He is encouraged to reduce his weight.

## 2015-06-09 NOTE — Assessment & Plan Note (Signed)
His blood pressure is not well controlled. Will uptitrate his beta blocker

## 2015-06-09 NOTE — Progress Notes (Signed)
HPI [name] returns for followup. He is a pleasant 65 yo man with morbid obesity, chronic atrial fibrillation, hypertension, who returns today for follow-up. In the interim, he has done well except for difficulty losing weight. He denies chest pain or shortness of breath. He has chronic but mild peripheral edema. He is trying to avoid sodium indiscretion. He was diagnosed with lymphoma, had it treated, and now appears to be in remission. He remains working a regular job. He has not had syncope. Allergies  Allergen Reactions  . Ampicillin     rash     Current Outpatient Prescriptions  Medication Sig Dispense Refill  . aspirin 325 MG EC tablet Take 325 mg by mouth daily.      . celecoxib (CELEBREX) 200 MG capsule Take 200 mg by mouth daily as needed for mild pain.     . metoprolol succinate (TOPROL-XL) 50 MG 24 hr tablet TAKE 1 TABLET (50 MG TOTAL) BY MOUTH EVERY MORNING. TAKE WITH OR IMMEDIATELY FOLLOWING A MEAL. 90 tablet 1  . valsartan-hydrochlorothiazide (DIOVAN-HCT) 320-12.5 MG per tablet Take 1 tablet by mouth every morning.    . fish oil-omega-3 fatty acids 1000 MG capsule Take 2 g by mouth 2 (two) times daily. Reported on 06/09/2015     No current facility-administered medications for this visit.     Past Medical History  Diagnosis Date  . Atrial fibrillation ([place])   . Lymphoma, follicular ([place])     Dr.Shadad  . Cervical lymphadenopathy   . GERD (gastroesophageal reflux disease)   . Erectile dysfunction   . Hypertension     ROS:   All systems reviewed and negative except as noted in the HPI.   Past Surgical History  Procedure Laterality Date  . Vasectomy    . A bsc    . Abscess drainage      under rt eye   . Deep neck lymph node biopsy / excision  2011     Family History  Problem Relation Age of Onset  . Hypertension Mother   . Kidney disease Mother   . Hypertension Father   . Hypertension Brother   . Kidney disease Maternal Aunt   . Kidney  disease Maternal Grandmother   . Diabetes Paternal Grandfather   . Hypertension Paternal Grandfather      Social History   Social History  . Marital Status: Married    Spouse Name: N/A  . Number of Children: N/A  . Years of Education: N/A   Occupational History  . Not on file.   Social History Main Topics  . Smoking status: Never Smoker   . Smokeless tobacco: Never Used  . Alcohol Use: No  . Drug Use: No  . Sexual Activity:    Partners: Female   Other Topics Concern  . Not on file   Social History Narrative     BP 130/88 mmHg  Pulse 86  Ht 5\' 10"  (1.778 m)  Wt 251 lb 3.2 oz (113.944 kg)  BMI 36.04 kg/m2  Physical Exam:  Well appearing 65 yo man, NAD HEENT: Unremarkable Neck:  7 cm JVD, no thyromegally Back:  No CVA tenderness Lungs:  Clear with no wheezes, rales, or rhonchi  HEART:  IRegular rate rhythm, no murmurs, no rubs, no clicks Abd:  soft, positive bowel sounds, no organomegally, no rebound, no guarding Ext:  2 plus pulses, no edema, no cyanosis, no clubbing Skin:  No rashes no nodules Neuro:  CN II through  XII intact, motor grossly intact  Assess/Plan:

## 2015-06-09 NOTE — Patient Instructions (Signed)
Medication Instructions:  Your physician has recommended you make the following change in your medication:  1) Increase Metoprolol to 50mg  in the am and 25mg  in the pm   Labwork: None ordered   Testing/Procedures: None ordered   Follow-Up: Your physician wants you to follow-up in: 12 months with Dr Knox Saliva will receive a reminder letter in the mail two months in advance. If you don't receive a letter, please call our office to schedule the follow-up appointment.   Any Other Special Instructions Will Be Listed Below (If Applicable).     If you need a refill on your cardiac medications before your next appointment, please call your pharmacy.

## 2015-06-09 NOTE — Assessment & Plan Note (Signed)
>>  ASSESSMENT AND PLAN FOR ESSENTIAL HYPERTENSION WRITTEN ON 06/09/2015  4:59 PM BY Evans Lance, MD  His blood pressure is not well controlled. Will uptitrate his beta blocker

## 2015-06-16 ENCOUNTER — Encounter: Payer: Self-pay | Admitting: Family Medicine

## 2015-06-16 ENCOUNTER — Ambulatory Visit (INDEPENDENT_AMBULATORY_CARE_PROVIDER_SITE_OTHER): Payer: BLUE CROSS/BLUE SHIELD | Admitting: Family Medicine

## 2015-06-16 VITALS — BP 134/90 | HR 86 | Temp 99.1°F | Wt 252.8 lb

## 2015-06-16 DIAGNOSIS — H66002 Acute suppurative otitis media without spontaneous rupture of ear drum, left ear: Secondary | ICD-10-CM | POA: Diagnosis not present

## 2015-06-16 DIAGNOSIS — H6692 Otitis media, unspecified, left ear: Secondary | ICD-10-CM

## 2015-06-16 MED ORDER — AZITHROMYCIN 250 MG PO TABS: ORAL_TABLET | ORAL | Status: DC

## 2015-06-16 NOTE — Patient Instructions (Signed)

## 2015-06-16 NOTE — Progress Notes (Signed)
Pre visit review using our clinic review tool, if applicable. No additional management support is needed unless otherwise documented below in the visit note. 

## 2015-06-17 DIAGNOSIS — H6692 Otitis media, unspecified, left ear: Secondary | ICD-10-CM | POA: Insufficient documentation

## 2015-06-17 NOTE — Assessment & Plan Note (Signed)
abx per orders Cerumen irrigated with success.  Unable to use hoop

## 2015-06-17 NOTE — Progress Notes (Signed)
Patient ID: Tom Fuller, male    DOB: [birth year]  Age: 65 y.o. MRN: [id number]    Subjective:  Subjective HPI Tom Fuller presents with c/o ear feeling full b/l and painful.  No other complaints.    Review of Systems  Constitutional: Negative for diaphoresis, appetite change, fatigue and unexpected weight change.  HENT: Positive for ear pain.   Eyes: Negative for pain, redness and visual disturbance.  Respiratory: Negative for cough, chest tightness, shortness of breath and wheezing.   Cardiovascular: Negative for chest pain, palpitations and leg swelling.  Endocrine: Negative for cold intolerance, heat intolerance, polydipsia, polyphagia and polyuria.  Genitourinary: Negative for dysuria, frequency and difficulty urinating.  Neurological: Negative for dizziness, light-headedness, numbness and headaches.    History Past Medical History  Diagnosis Date  . Atrial fibrillation ([place])   . Lymphoma, follicular ([place])     Dr.Shadad  . Cervical lymphadenopathy   . GERD (gastroesophageal reflux disease)   . Erectile dysfunction   . Hypertension     Tom Fuller has past surgical history that includes Vasectomy; a bsc; Abscess drainage; and Deep neck lymph node biopsy / excision (2011).   His family history includes Diabetes in his paternal grandfather; Hypertension in his brother, father, mother, and paternal grandfather; Kidney disease in his maternal aunt, maternal grandmother, and mother.Tom Fuller reports that Tom Fuller has never smoked. Tom Fuller has never used smokeless tobacco. Tom Fuller reports that Tom Fuller does not drink alcohol or use illicit drugs.  Current Outpatient Prescriptions on File Prior to Visit  Medication Sig Dispense Refill  . aspirin 325 MG EC tablet Take 325 mg by mouth daily.      . celecoxib (CELEBREX) 200 MG capsule Take 200 mg by mouth daily as needed for mild pain.     . fish oil-omega-3 fatty acids 1000 MG capsule Take 2 g by mouth 2 (two) times daily. Reported on 06/09/2015    . metoprolol  succinate (TOPROL-XL) 50 MG 24 hr tablet Take 1 tablet by mouth in the am and 1/2 tablet by mouth in the pm 135 tablet 3  . valsartan-hydrochlorothiazide (DIOVAN-HCT) 320-12.5 MG per tablet Take 1 tablet by mouth every morning.     No current facility-administered medications on file prior to visit.     Objective:  Objective Physical Exam  Constitutional: Tom Fuller is oriented to person, place, and time. Vital signs are normal. Tom Fuller appears well-developed and well-nourished. Tom Fuller is sleeping.  HENT:  Head: Normocephalic and atraumatic.  Right Ear: Hearing, tympanic membrane, external ear and ear canal normal.  Left Ear: There is tenderness. Tympanic membrane is injected.  Ears:  Mouth/Throat: Oropharynx is clear and moist.  Eyes: EOM are normal. Pupils are equal, round, and reactive to light.  Neck: Normal range of motion. Neck supple. No thyromegaly present.  Cardiovascular: Normal rate and regular rhythm.   No murmur heard. Pulmonary/Chest: Effort normal and breath sounds normal. No respiratory distress. Tom Fuller has no wheezes. Tom Fuller has no rales. Tom Fuller exhibits no tenderness.  Musculoskeletal: Tom Fuller exhibits no edema or tenderness.  Neurological: Tom Fuller is alert and oriented to person, place, and time.  Skin: Skin is warm and dry.  Psychiatric: Tom Fuller has a normal mood and affect. His behavior is normal. Judgment and thought content normal.  Nursing note and vitals reviewed.  BP 134/90 mmHg  Pulse 86  Temp(Src) 99.1 F (37.3 C) (Oral)  Wt 252 lb 12.8 oz (114.669 kg)  SpO2 97% Wt Readings from Last 3 Encounters:  06/16/15 252 lb 12.8 oz (  114.669 kg)  06/09/15 251 lb 3.2 oz (113.944 kg)  02/13/15 261 lb 1.6 oz (118.434 kg)     Lab Results  Component Value Date   WBC 8.9 02/11/2015   HGB 14.2 02/11/2015   HCT 41.2 02/11/2015   PLT 224 02/11/2015   GLUCOSE 163* 02/11/2015   CHOL 149 03/28/2011   TRIG 83.0 03/28/2011   HDL 34.50* 03/28/2011   LDLCALC 98 03/28/2011   ALT 53 02/11/2015   AST 37*  02/11/2015   NA 138 02/11/2015   K 4.0 02/11/2015   CL 107 11/09/2012   CREATININE 1.0 02/11/2015   BUN 16.0 02/11/2015   CO2 29 02/11/2015   TSH 1.69 09/23/2010   PSA 0.32 09/23/2010   INR 1.07 02/13/2014    Ct Soft Tissue Neck W Contrast  02/11/2015  CLINICAL DATA:  Follicular lymphoma follow-up. No current complaints. EXAM: CT NECK WITH CONTRAST TECHNIQUE: Multidetector CT imaging of the neck was performed using the standard protocol following the bolus administration of intravenous contrast. CONTRAST:  127mL OMNIPAQUE IOHEXOL 300 MG/ML  SOLN COMPARISON:  02/04/2014 FINDINGS: Pharynx and larynx: Nasopharynx is unremarkable. Mild symmetric palatine tonsillar soft tissue prominence is unchanged without a discrete mass. Asymmetry of the piriform sinuses is similar to the 02/11/2013 study without a hypopharyngeal or laryngeal mass. Salivary glands: Submandibular and parotid glands are unremarkable. Thyroid: Low-density, partially calcified nodules involving the thyroid isthmus and anterior left lobe are grossly unchanged, measuring up to approximately 1.8 cm in size. Lymph nodes: Small bilateral cervical lymph nodes are unchanged from the prior study, the largest measuring 9 mm in short axis in level IIA. Vascular: Major vascular structures of the neck appear patent. Mild atherosclerotic plaque at the left carotid bifurcation. Right vertebral artery is dominant. Limited intracranial: The visualized portion of the brain is unremarkable. Visualized orbits: Unremarkable. Mastoids and visualized paranasal sinuses: Chronic bilateral maxillary sinusitis. Small amount of fluid in the left maxillary sinus, increased from prior. Small bilateral mastoid effusions. Skeleton: Moderate lower cervical spondylosis. Upper chest: Evaluated on concurrent chest CT. IMPRESSION: 1. Small bilateral lymph nodes throughout the neck, unchanged. 2. Chronic bilateral maxillary sinusitis with increased left maxillary sinus fluid  which could reflect superimposed acute sinusitis. Electronically Signed   By: Logan Bores [name]   On: 02/11/2015 17:04   Ct Chest W Contrast  02/11/2015  CLINICAL DATA:  Restaging follicular lymphoma. EXAM: CT CHEST, ABDOMEN, AND PELVIS WITH CONTRAST TECHNIQUE: Multidetector CT imaging of the chest, abdomen and pelvis was performed following the standard protocol during bolus administration of intravenous contrast. CONTRAST:  136mL OMNIPAQUE IOHEXOL 300 MG/ML  SOLN COMPARISON:  CT scan 02/04/2014 FINDINGS: CT CHEST FINDINGS Chest wall: No chest wall mass, supraclavicular or axillary lymphadenopathy. Small scattered lymph nodes are stable. Small thyroid gland nodules are stable. The bony thorax is intact. No destructive bone lesions or spinal canal compromise. Mediastinum: The heart is normal in size and stable. No pericardial effusion. Mild tortuosity of the thoracic aorta but no aneurysm or dissection. The branch vessels are patent. The esophagus is grossly normal. There are small scattered stable mediastinal and hilar lymph nodes. 8.5 mm right paratracheal node on image 9 is unchanged. 7 mm subcarinal node on image 24 is unchanged. 10 mm retroesophageal lymph node on image 20 measures 10.5 mm and previously measured 9.5 mm. Lungs/pleura: No acute pulmonary findings. No worrisome pulmonary lesions. No pleural effusion. CT ABDOMEN AND PELVIS FINDINGS Hepatobiliary: No focal hepatic lesions or intrahepatic biliary dilatation. The gallbladder is normal.  No common bile duct dilatation. Pancreas: Mild fatty change involving the pancreas but no mass, inflammation or ductal dilatation. Spleen: Normal size.  No focal lesions. Adrenals/Urinary Tract: The adrenal glands are normal and stable. Stable small left renal cyst. No worrisome renal lesions or hydronephrosis. Stomach/Bowel: The stomach, duodenum, small bowel and colon are unremarkable. No inflammatory changes, mass lesions or obstructive findings.  Vascular/Lymphatic: Persistent hazy appearance of the small bowel mesenteric with numerous small mesenteric lymph nodes. Index lymph node on image number 61 measures 10 mm and is unchanged. Stable retroperitoneal lymphadenopathy. Index node on image number 70 measures 19 x 12 mm and previously measured 18 x 13 mm. Left-sided iliac lymph node on image number 92 measures 9 mm and is stable. Other: The bladder, prostate gland and seminal vesicles are unremarkable. Small scattered pelvic lymph nodes are stable. No inguinal adenopathy. Musculoskeletal: No significant bony findings. IMPRESSION: 1. Stable small scattered mediastinal lymph nodes, mesenteric lymph nodes and retroperitoneal lymph nodes. No findings for progressive adenopathy or new adenopathy. 2. No acute pulmonary findings. 3. No acute abdominal/pelvic findings or mass lesions and no organomegaly. Electronically Signed   By: Marijo Sanes [name]   On: 02/11/2015 17:14   Ct Abdomen Pelvis W Contrast  02/11/2015  CLINICAL DATA:  Restaging follicular lymphoma. EXAM: CT CHEST, ABDOMEN, AND PELVIS WITH CONTRAST TECHNIQUE: Multidetector CT imaging of the chest, abdomen and pelvis was performed following the standard protocol during bolus administration of intravenous contrast. CONTRAST:  152mL OMNIPAQUE IOHEXOL 300 MG/ML  SOLN COMPARISON:  CT scan 02/04/2014 FINDINGS: CT CHEST FINDINGS Chest wall: No chest wall mass, supraclavicular or axillary lymphadenopathy. Small scattered lymph nodes are stable. Small thyroid gland nodules are stable. The bony thorax is intact. No destructive bone lesions or spinal canal compromise. Mediastinum: The heart is normal in size and stable. No pericardial effusion. Mild tortuosity of the thoracic aorta but no aneurysm or dissection. The branch vessels are patent. The esophagus is grossly normal. There are small scattered stable mediastinal and hilar lymph nodes. 8.5 mm right paratracheal node on image 9 is unchanged. 7 mm  subcarinal node on image 24 is unchanged. 10 mm retroesophageal lymph node on image 20 measures 10.5 mm and previously measured 9.5 mm. Lungs/pleura: No acute pulmonary findings. No worrisome pulmonary lesions. No pleural effusion. CT ABDOMEN AND PELVIS FINDINGS Hepatobiliary: No focal hepatic lesions or intrahepatic biliary dilatation. The gallbladder is normal. No common bile duct dilatation. Pancreas: Mild fatty change involving the pancreas but no mass, inflammation or ductal dilatation. Spleen: Normal size.  No focal lesions. Adrenals/Urinary Tract: The adrenal glands are normal and stable. Stable small left renal cyst. No worrisome renal lesions or hydronephrosis. Stomach/Bowel: The stomach, duodenum, small bowel and colon are unremarkable. No inflammatory changes, mass lesions or obstructive findings. Vascular/Lymphatic: Persistent hazy appearance of the small bowel mesenteric with numerous small mesenteric lymph nodes. Index lymph node on image number 61 measures 10 mm and is unchanged. Stable retroperitoneal lymphadenopathy. Index node on image number 70 measures 19 x 12 mm and previously measured 18 x 13 mm. Left-sided iliac lymph node on image number 92 measures 9 mm and is stable. Other: The bladder, prostate gland and seminal vesicles are unremarkable. Small scattered pelvic lymph nodes are stable. No inguinal adenopathy. Musculoskeletal: No significant bony findings. IMPRESSION: 1. Stable small scattered mediastinal lymph nodes, mesenteric lymph nodes and retroperitoneal lymph nodes. No findings for progressive adenopathy or new adenopathy. 2. No acute pulmonary findings. 3. No acute abdominal/pelvic findings or  mass lesions and no organomegaly. Electronically Signed   By: Marijo Sanes [name]   On: 02/11/2015 17:14     Assessment & Plan:  Plan I am having Tom Fuller start on azithromycin. I am also having him maintain his aspirin, celecoxib, fish oil-omega-3 fatty acids,  valsartan-hydrochlorothiazide, and metoprolol succinate.  Meds ordered this encounter  Medications  . azithromycin (ZITHROMAX Z-PAK) 250 MG tablet    Sig: As directed    Dispense:  6 each    Refill:  0    Problem List Items Addressed This Visit      Unprioritized   Left otitis media    abx per orders Cerumen irrigated with success.  Unable to use hoop      Relevant Medications   azithromycin (ZITHROMAX Z-PAK) 250 MG tablet    Other Visit Diagnoses    Acute suppurative otitis media of left ear without spontaneous rupture of tympanic membrane, recurrence not specified    -  Primary    Relevant Medications    azithromycin (ZITHROMAX Z-PAK) 250 MG tablet       Follow-up: Return if symptoms worsen or fail to improve.  Garnet Koyanagi, DO

## 2015-07-22 ENCOUNTER — Other Ambulatory Visit: Payer: Self-pay | Admitting: Internal Medicine

## 2015-08-14 ENCOUNTER — Ambulatory Visit (HOSPITAL_BASED_OUTPATIENT_CLINIC_OR_DEPARTMENT_OTHER): Payer: BLUE CROSS/BLUE SHIELD | Admitting: Oncology

## 2015-08-14 ENCOUNTER — Other Ambulatory Visit (HOSPITAL_BASED_OUTPATIENT_CLINIC_OR_DEPARTMENT_OTHER): Payer: BLUE CROSS/BLUE SHIELD

## 2015-08-14 ENCOUNTER — Telehealth: Payer: Self-pay | Admitting: Oncology

## 2015-08-14 VITALS — BP 158/95 | HR 78 | Temp 98.6°F | Resp 18 | Ht 70.0 in | Wt 250.5 lb

## 2015-08-14 DIAGNOSIS — C8290 Follicular lymphoma, unspecified, unspecified site: Secondary | ICD-10-CM | POA: Diagnosis not present

## 2015-08-14 DIAGNOSIS — C8590 Non-Hodgkin lymphoma, unspecified, unspecified site: Secondary | ICD-10-CM

## 2015-08-14 DIAGNOSIS — I4891 Unspecified atrial fibrillation: Secondary | ICD-10-CM

## 2015-08-14 LAB — CBC WITH DIFFERENTIAL/PLATELET
BASO%: 0.9 % (ref 0.0–2.0)
BASOS ABS: 0.1 10*3/uL (ref 0.0–0.1)
EOS%: 5 % (ref 0.0–7.0)
Eosinophils Absolute: 0.4 10*3/uL (ref 0.0–0.5)
HEMATOCRIT: 43.1 % (ref 38.4–49.9)
HGB: 14.5 g/dL (ref 13.0–17.1)
LYMPH#: 2.3 10*3/uL (ref 0.9–3.3)
LYMPH%: 26.5 % (ref 14.0–49.0)
MCH: 31.8 pg (ref 27.2–33.4)
MCHC: 33.6 g/dL (ref 32.0–36.0)
MCV: 94.6 fL (ref 79.3–98.0)
MONO#: 1 10*3/uL — AB (ref 0.1–0.9)
MONO%: 11.3 % (ref 0.0–14.0)
NEUT#: 5 10*3/uL (ref 1.5–6.5)
NEUT%: 56.3 % (ref 39.0–75.0)
PLATELETS: 224 10*3/uL (ref 140–400)
RBC: 4.55 10*6/uL (ref 4.20–5.82)
RDW: 13.5 % (ref 11.0–14.6)
WBC: 8.9 10*3/uL (ref 4.0–10.3)

## 2015-08-14 LAB — COMPREHENSIVE METABOLIC PANEL
ALBUMIN: 3.9 g/dL (ref 3.5–5.0)
ALK PHOS: 74 U/L (ref 40–150)
ALT: 26 U/L (ref 0–55)
ANION GAP: 7 meq/L (ref 3–11)
AST: 21 U/L (ref 5–34)
BUN: 17.5 mg/dL (ref 7.0–26.0)
CALCIUM: 9.8 mg/dL (ref 8.4–10.4)
CO2: 30 mEq/L — ABNORMAL HIGH (ref 22–29)
Chloride: 103 mEq/L (ref 98–109)
Creatinine: 1.1 mg/dL (ref 0.7–1.3)
EGFR: 69 mL/min/{1.73_m2} — AB (ref >90)
Glucose: 186 mg/dl — ABNORMAL HIGH (ref 70–140)
POTASSIUM: 4.3 meq/L (ref 3.5–5.1)
Sodium: 140 mEq/L (ref 136–145)
Total Bilirubin: 0.65 mg/dL (ref 0.20–1.20)
Total Protein: 7 g/dL (ref 6.4–8.3)

## 2015-08-14 LAB — LACTATE DEHYDROGENASE: LDH: 150 U/L (ref 125–245)

## 2015-08-14 NOTE — Progress Notes (Signed)
Hematology and Oncology Follow Up Visit  Tom Fuller [id number] Mar 02, 1951 65 y.o. 08/14/2015 3:31 PM    CC: Tom Fuller. Tom Le, MD  Tom Fuller, [name]  Tom Chessman, [name]: A 65 year old gentleman with stage IIIA low-grade follicular lymphoma diagnosed May 2010.  Past Therapy:  He is S/P Bendamustine and rituximab started on 08/25/11 for 6 cycles completed in 12/2011.  Interim History:  Tom Fuller presents today for a followup visit. Since his last visit, he continues to do well without any recent complaints. He continues to work full time without any decline in ability to do so. He denied any fevers, chills or constitutional symptoms. He denied any painful adenopathy. He does not report any abdominal pain or early satiety. He denied any weight loss or changes in his mentation. He does take metoprolol for atrial fibrillation and does report some occasional fatigue after recent changes in his dose.  He denies fever, chest pain, shortness of breath. No vomiting reported at this time. He denies any headaches, blurry vision or syncope. He did not report any chest pain, shortness of breath or cough. He does not report any nausea, change in his bowel habits. The remaining review of systems unremarkable.  Medications: I have reviewed the patient's current medications.  Current Outpatient Prescriptions  Medication Sig Dispense Refill  . aspirin 325 MG EC tablet Take 325 mg by mouth daily.      . fish oil-omega-3 fatty acids 1000 MG capsule Take 2 g by mouth 2 (two) times daily. Reported on 06/09/2015    . metoprolol succinate (TOPROL-XL) 50 MG 24 hr tablet Take 1 tablet by mouth in the am and 1/2 tablet by mouth in the pm 135 tablet 3  . valsartan-hydrochlorothiazide (DIOVAN-HCT) 320-12.5 MG tablet TAKE 1 TABLET BY MOUTH EVERY DAY 90 tablet 3   No current facility-administered medications for this visit.    Allergies:  Allergies  Allergen Reactions  . Ampicillin    rash    Past Medical History, Surgical history, Social history, and Family History were reviewed and updated.    Physical Exam: Blood pressure 158/95, pulse 78, temperature 98.6 F (37 C), temperature source Oral, resp. rate 18, height 5\' 10"  (1.778 m), weight 250 lb 8 oz (113.626 kg), SpO2 100 %. ECOG: 0 General appearance: alert  gentleman appeared without distress. Head: Normocephalic, without obvious abnormality no oral ulcers or lesions. Neck: no adenopathy Lymph nodes: No cervical, inguinal or axillary adenopathy. Heart: Irregular without murmurs. Lung:chest clear, no wheezing, rales, normal symmetric air entry,  Abdomen: soft, non-tender, without masses or organomegaly no shifting dullness or ascites. Skin: No ecchymosis or petechiae. EXT:no erythema, induration, or nodules  Lab Results: Lab Results  Component Value Date   WBC 8.9 08/14/2015   HGB 14.5 08/14/2015   HCT 43.1 08/14/2015   MCV 94.6 08/14/2015   PLT 224 08/14/2015       Impression and Plan: This a pleasant 65 year old gentleman with the following issues: 1. Stage III follicular lymphoma. He is S/P 6 cycles of chemotherapy concluded in March 2013 and tolerated it well overall without any major toxicities. CT scans on 02/11/2015  continue to show excellent response without evidence of relapse disease. The plan is to ontinue with active surveillanceWith physical examination, laboratory data every 6 months. We will repeat imaging studies as needed. If he develops symptomatic progression, the first salvage therapy will be utilized at that time.   2. Atrial fibrillation. Follows up with cardiology.  His rate is controlled.  3. Port management: Port-A-Cath removed in September 2015. 4. Follow-up: Will be in 6 months.   [id number] 3/17/20173:31 PM

## 2015-08-14 NOTE — Telephone Encounter (Signed)
Gave patient avs report and appointments for September.  °

## 2015-10-29 ENCOUNTER — Encounter: Payer: Self-pay | Admitting: Family Medicine

## 2015-10-29 ENCOUNTER — Ambulatory Visit (INDEPENDENT_AMBULATORY_CARE_PROVIDER_SITE_OTHER): Payer: BLUE CROSS/BLUE SHIELD | Admitting: Family Medicine

## 2015-10-29 VITALS — BP 144/90 | HR 85 | Temp 98.8°F | Ht 70.0 in | Wt 248.8 lb

## 2015-10-29 DIAGNOSIS — E1151 Type 2 diabetes mellitus with diabetic peripheral angiopathy without gangrene: Secondary | ICD-10-CM | POA: Diagnosis not present

## 2015-10-29 DIAGNOSIS — IMO0002 Reserved for concepts with insufficient information to code with codable children: Secondary | ICD-10-CM

## 2015-10-29 DIAGNOSIS — E1165 Type 2 diabetes mellitus with hyperglycemia: Secondary | ICD-10-CM | POA: Diagnosis not present

## 2015-10-29 DIAGNOSIS — I10 Essential (primary) hypertension: Secondary | ICD-10-CM | POA: Diagnosis not present

## 2015-10-29 LAB — COMPREHENSIVE METABOLIC PANEL
ALBUMIN: 4.4 g/dL (ref 3.5–5.2)
ALK PHOS: 65 U/L (ref 39–117)
ALT: 30 U/L (ref 0–53)
AST: 23 U/L (ref 0–37)
BILIRUBIN TOTAL: 1.1 mg/dL (ref 0.2–1.2)
BUN: 24 mg/dL — ABNORMAL HIGH (ref 6–23)
CALCIUM: 9.8 mg/dL (ref 8.4–10.5)
CO2: 31 mEq/L (ref 19–32)
Chloride: 101 mEq/L (ref 96–112)
Creatinine, Ser: 1.06 mg/dL (ref 0.40–1.50)
GFR: 74.88 mL/min (ref >60.00)
GLUCOSE: 144 mg/dL — AB (ref 70–99)
POTASSIUM: 3.7 meq/L (ref 3.5–5.1)
Sodium: 138 mEq/L (ref 135–145)
TOTAL PROTEIN: 7.1 g/dL (ref 6.0–8.3)

## 2015-10-29 LAB — LIPID PANEL
CHOLESTEROL: 127 mg/dL (ref 0–200)
HDL: 33.7 mg/dL — AB (ref >39.00)
LDL Cholesterol: 82 mg/dL (ref 0–99)
NONHDL: 93.12
TRIGLYCERIDES: 58 mg/dL (ref 0.0–149.0)
Total CHOL/HDL Ratio: 4
VLDL: 11.6 mg/dL (ref 0.0–40.0)

## 2015-10-29 LAB — CBC WITH DIFFERENTIAL/PLATELET
BASOS ABS: 0 10*3/uL (ref 0.0–0.1)
Basophils Relative: 0.5 % (ref 0.0–3.0)
EOS PCT: 4.9 % (ref 0.0–5.0)
Eosinophils Absolute: 0.5 10*3/uL (ref 0.0–0.7)
HEMATOCRIT: 43.8 % (ref 39.0–52.0)
Hemoglobin: 14.8 g/dL (ref 13.0–17.0)
LYMPHS ABS: 2.6 10*3/uL (ref 0.7–4.0)
LYMPHS PCT: 28.1 % (ref 12.0–46.0)
MCHC: 33.7 g/dL (ref 30.0–36.0)
MCV: 94.7 fl (ref 78.0–100.0)
MONOS PCT: 10 % (ref 3.0–12.0)
Monocytes Absolute: 0.9 10*3/uL (ref 0.1–1.0)
NEUTROS PCT: 56.5 % (ref 43.0–77.0)
Neutro Abs: 5.2 10*3/uL (ref 1.4–7.7)
Platelets: 259 10*3/uL (ref 150.0–400.0)
RBC: 4.63 Mil/uL (ref 4.22–5.81)
RDW: 14.5 % (ref 11.5–15.5)
WBC: 9.2 10*3/uL (ref 4.0–10.5)

## 2015-10-29 LAB — MICROALBUMIN / CREATININE URINE RATIO
Creatinine,U: 178.3 mg/dL
MICROALB/CREAT RATIO: 9.2 mg/g (ref 0.0–30.0)
Microalb, Ur: 16.4 mg/dL — ABNORMAL HIGH (ref 0.0–1.9)

## 2015-10-29 LAB — HEMOGLOBIN A1C: HEMOGLOBIN A1C: 7.9 % — AB (ref 4.6–6.5)

## 2015-10-29 MED ORDER — METOPROLOL SUCCINATE ER 50 MG PO TB24
ORAL_TABLET | ORAL | Status: DC
Start: 2015-10-29 — End: 2016-03-21

## 2015-10-29 MED ORDER — VALSARTAN-HYDROCHLOROTHIAZIDE 320-12.5 MG PO TABS: 1.0000 | ORAL_TABLET | Freq: Every day | ORAL | Status: DC

## 2015-10-29 NOTE — Progress Notes (Signed)
Pre visit review using our clinic review tool, if applicable. No additional management support is needed unless otherwise documented below in the visit note. 

## 2015-10-29 NOTE — Patient Instructions (Signed)

## 2015-10-29 NOTE — Progress Notes (Signed)
Patient ID: Tom Fuller, male    DOB: 02-22-1951  Age: 65 y.o. MRN: [id number]    Subjective:  Subjective HPI Tom Fuller presents for f/u bp and his wife has been checking his sugar and its been running high.  No other problems/ complaints.   Review of Systems  Constitutional: Negative for diaphoresis, appetite change, fatigue and unexpected weight change.  Eyes: Negative for pain, redness and visual disturbance.  Respiratory: Negative for cough, chest tightness, shortness of breath and wheezing.   Cardiovascular: Negative for chest pain, palpitations and leg swelling.  Endocrine: Negative for cold intolerance, heat intolerance, polydipsia, polyphagia and polyuria.  Genitourinary: Negative for dysuria, frequency and difficulty urinating.  Neurological: Negative for dizziness, light-headedness, numbness and headaches.    History Past Medical History  Diagnosis Date  . Atrial fibrillation ([place])   . Lymphoma, follicular ([place])     Dr.Shadad  . Cervical lymphadenopathy   . GERD (gastroesophageal reflux disease)   . Erectile dysfunction   . Hypertension     He has past surgical history that includes Vasectomy; a bsc; Abscess drainage; and Deep neck lymph node biopsy / excision (2011).   His family history includes Diabetes in his paternal grandfather; Hypertension in his brother, father, mother, and paternal grandfather; Kidney disease in his maternal aunt, maternal grandmother, and mother.He reports that he has never smoked. He has never used smokeless tobacco. He reports that he does not drink alcohol or use illicit drugs.  Current Outpatient Prescriptions on File Prior to Visit  Medication Sig Dispense Refill  . aspirin 325 MG EC tablet Take 325 mg by mouth daily.      . fish oil-omega-3 fatty acids 1000 MG capsule Take 2 g by mouth 2 (two) times daily. Reported on 06/09/2015     No current facility-administered medications on file prior to visit.     Objective:   Objective Physical Exam  Constitutional: He is oriented to person, place, and time. Vital signs are normal. He appears well-developed and well-nourished. He is sleeping.  HENT:  Head: Normocephalic and atraumatic.  Mouth/Throat: Oropharynx is clear and moist.  Eyes: EOM are normal. Pupils are equal, round, and reactive to light.  Neck: Normal range of motion. Neck supple. No thyromegaly present.  Cardiovascular: Normal rate and regular rhythm.   No murmur heard. Pulmonary/Chest: Effort normal and breath sounds normal. No respiratory distress. He has no wheezes. He has no rales. He exhibits no tenderness.  Musculoskeletal: He exhibits no edema or tenderness.  Neurological: He is alert and oriented to person, place, and time.  Skin: Skin is warm and dry.  Psychiatric: He has a normal mood and affect. His behavior is normal. Judgment and thought content normal.  Nursing note and vitals reviewed. Sensory exam of the foot is normal, tested with the monofilament. Good pulses, no lesions or ulcers, good peripheral pulses.   BP 144/90 mmHg  Pulse 85  Temp(Src) 98.8 F (37.1 C) (Oral)  Ht _0  (1.778 m)  Wt 248 lb 12.8 oz (112.855 kg)  BMI 35.70 kg/m2  SpO2 98% Wt Readings from Last 3 Encounters:  10/29/15 248 lb 12.8 oz (112.855 kg)  08/14/15 250 lb 8 oz (113.626 kg)  06/16/15 252 lb 12.8 oz (114.669 kg)     Lab Results  Component Value Date   WBC 9.2 10/29/2015   HGB 14.8 10/29/2015   HCT 43.8 10/29/2015   PLT 259.0 10/29/2015   GLUCOSE 144* 10/29/2015   CHOL 127 10/29/2015  TRIG 58.0 10/29/2015   HDL 33.70* 10/29/2015   LDLCALC 82 10/29/2015   ALT 30 10/29/2015   AST 23 10/29/2015   NA 138 10/29/2015   K 3.7 10/29/2015   CL 101 10/29/2015   CREATININE 1.06 10/29/2015   BUN 24* 10/29/2015   CO2 31 10/29/2015   TSH 1.69 09/23/2010   PSA 0.32 09/23/2010   INR 1.07 02/13/2014   HGBA1C 7.9* 10/29/2015   MICROALBUR 16.4* 10/29/2015    Ct Soft Tissue Neck W  Contrast  02/11/2015  CLINICAL DATA:  Follicular lymphoma follow-up. No current complaints. EXAM: CT NECK WITH CONTRAST TECHNIQUE: Multidetector CT imaging of the neck was performed using the standard protocol following the bolus administration of intravenous contrast. CONTRAST:  165m OMNIPAQUE IOHEXOL 300 MG/ML  SOLN COMPARISON:  02/04/2014 FINDINGS: Pharynx and larynx: Nasopharynx is unremarkable. Mild symmetric palatine tonsillar soft tissue prominence is unchanged without a discrete mass. Asymmetry of the piriform sinuses is similar to the 02/11/2013 study without a hypopharyngeal or laryngeal mass. Salivary glands: Submandibular and parotid glands are unremarkable. Thyroid: Low-density, partially calcified nodules involving the thyroid isthmus and anterior left lobe are grossly unchanged, measuring up to approximately 1.8 cm in size. Lymph nodes: Small bilateral cervical lymph nodes are unchanged from the prior study, the largest measuring 9 mm in short axis in level IIA. Vascular: Major vascular structures of the neck appear patent. Mild atherosclerotic plaque at the left carotid bifurcation. Right vertebral artery is dominant. Limited intracranial: The visualized portion of the brain is unremarkable. Visualized orbits: Unremarkable. Mastoids and visualized paranasal sinuses: Chronic bilateral maxillary sinusitis. Small amount of fluid in the left maxillary sinus, increased from prior. Small bilateral mastoid effusions. Skeleton: Moderate lower cervical spondylosis. Upper chest: Evaluated on concurrent chest CT. IMPRESSION: 1. Small bilateral lymph nodes throughout the neck, unchanged. 2. Chronic bilateral maxillary sinusitis with increased left maxillary sinus fluid which could reflect superimposed acute sinusitis. Electronically Signed   By: ALogan BoresM.D.   On: 02/11/2015 17:04   Ct Chest W Contrast  02/11/2015  CLINICAL DATA:  Restaging follicular lymphoma. EXAM: CT CHEST, ABDOMEN, AND PELVIS WITH  CONTRAST TECHNIQUE: Multidetector CT imaging of the chest, abdomen and pelvis was performed following the standard protocol during bolus administration of intravenous contrast. CONTRAST:  1044mOMNIPAQUE IOHEXOL 300 MG/ML  SOLN COMPARISON:  CT scan 02/04/2014 FINDINGS: CT CHEST FINDINGS Chest wall: No chest wall mass, supraclavicular or axillary lymphadenopathy. Small scattered lymph nodes are stable. Small thyroid gland nodules are stable. The bony thorax is intact. No destructive bone lesions or spinal canal compromise. Mediastinum: The heart is normal in size and stable. No pericardial effusion. Mild tortuosity of the thoracic aorta but no aneurysm or dissection. The branch vessels are patent. The esophagus is grossly normal. There are small scattered stable mediastinal and hilar lymph nodes. 8.5 mm right paratracheal node on image 9 is unchanged. 7 mm subcarinal node on image 24 is unchanged. 10 mm retroesophageal lymph node on image 20 measures 10.5 mm and previously measured 9.5 mm. Lungs/pleura: No acute pulmonary findings. No worrisome pulmonary lesions. No pleural effusion. CT ABDOMEN AND PELVIS FINDINGS Hepatobiliary: No focal hepatic lesions or intrahepatic biliary dilatation. The gallbladder is normal. No common bile duct dilatation. Pancreas: Mild fatty change involving the pancreas but no mass, inflammation or ductal dilatation. Spleen: Normal size.  No focal lesions. Adrenals/Urinary Tract: The adrenal glands are normal and stable. Stable small left renal cyst. No worrisome renal lesions or hydronephrosis. Stomach/Bowel: The stomach, duodenum,  small bowel and colon are unremarkable. No inflammatory changes, mass lesions or obstructive findings. Vascular/Lymphatic: Persistent hazy appearance of the small bowel mesenteric with numerous small mesenteric lymph nodes. Index lymph node on image number 61 measures 10 mm and is unchanged. Stable retroperitoneal lymphadenopathy. Index node on image number 70  measures 19 x 12 mm and previously measured 18 x 13 mm. Left-sided iliac lymph node on image number 92 measures 9 mm and is stable. Other: The bladder, prostate gland and seminal vesicles are unremarkable. Small scattered pelvic lymph nodes are stable. No inguinal adenopathy. Musculoskeletal: No significant bony findings. IMPRESSION: 1. Stable small scattered mediastinal lymph nodes, mesenteric lymph nodes and retroperitoneal lymph nodes. No findings for progressive adenopathy or new adenopathy. 2. No acute pulmonary findings. 3. No acute abdominal/pelvic findings or mass lesions and no organomegaly. Electronically Signed   By: Marijo Sanes [name]   On: 02/11/2015 17:14   Ct Abdomen Pelvis W Contrast  02/11/2015  CLINICAL DATA:  Restaging follicular lymphoma. EXAM: CT CHEST, ABDOMEN, AND PELVIS WITH CONTRAST TECHNIQUE: Multidetector CT imaging of the chest, abdomen and pelvis was performed following the standard protocol during bolus administration of intravenous contrast. CONTRAST:  16m OMNIPAQUE IOHEXOL 300 MG/ML  SOLN COMPARISON:  CT scan 02/04/2014 FINDINGS: CT CHEST FINDINGS Chest wall: No chest wall mass, supraclavicular or axillary lymphadenopathy. Small scattered lymph nodes are stable. Small thyroid gland nodules are stable. The bony thorax is intact. No destructive bone lesions or spinal canal compromise. Mediastinum: The heart is normal in size and stable. No pericardial effusion. Mild tortuosity of the thoracic aorta but no aneurysm or dissection. The branch vessels are patent. The esophagus is grossly normal. There are small scattered stable mediastinal and hilar lymph nodes. 8.5 mm right paratracheal node on image 9 is unchanged. 7 mm subcarinal node on image 24 is unchanged. 10 mm retroesophageal lymph node on image 20 measures 10.5 mm and previously measured 9.5 mm. Lungs/pleura: No acute pulmonary findings. No worrisome pulmonary lesions. No pleural effusion. CT ABDOMEN AND PELVIS FINDINGS  Hepatobiliary: No focal hepatic lesions or intrahepatic biliary dilatation. The gallbladder is normal. No common bile duct dilatation. Pancreas: Mild fatty change involving the pancreas but no mass, inflammation or ductal dilatation. Spleen: Normal size.  No focal lesions. Adrenals/Urinary Tract: The adrenal glands are normal and stable. Stable small left renal cyst. No worrisome renal lesions or hydronephrosis. Stomach/Bowel: The stomach, duodenum, small bowel and colon are unremarkable. No inflammatory changes, mass lesions or obstructive findings. Vascular/Lymphatic: Persistent hazy appearance of the small bowel mesenteric with numerous small mesenteric lymph nodes. Index lymph node on image number 61 measures 10 mm and is unchanged. Stable retroperitoneal lymphadenopathy. Index node on image number 70 measures 19 x 12 mm and previously measured 18 x 13 mm. Left-sided iliac lymph node on image number 92 measures 9 mm and is stable. Other: The bladder, prostate gland and seminal vesicles are unremarkable. Small scattered pelvic lymph nodes are stable. No inguinal adenopathy. Musculoskeletal: No significant bony findings. IMPRESSION: 1. Stable small scattered mediastinal lymph nodes, mesenteric lymph nodes and retroperitoneal lymph nodes. No findings for progressive adenopathy or new adenopathy. 2. No acute pulmonary findings. 3. No acute abdominal/pelvic findings or mass lesions and no organomegaly. Electronically Signed   By: PMarijo SanesM.D.   On: 02/11/2015 17:14     Assessment & Plan:  Plan I have changed Mr. Ulysse's metoprolol succinate, valsartan-hydrochlorothiazide, and glucose blood. I am also having him maintain his aspirin, fish oil-omega-3 fatty  acids, and [place].  Meds ordered this encounter  Medications  . metoprolol succinate (TOPROL-XL) 50 MG 24 hr tablet    Sig: Take 1 tablet by mouth bid    Dispense:  180 tablet    Refill:  3  . valsartan-hydrochlorothiazide  (DIOVAN-HCT) 320-12.5 MG tablet    Sig: Take 1 tablet by mouth daily.    Dispense:  90 tablet    Refill:  3  . Blood Glucose Monitoring Suppl ([place]) w/Device KIT    Sig: by Does not apply route.  Marland Kitchen DISCONTD: glucose blood test strip    Sig: 1 each by Other route as needed for other. Use as instructed  . glucose blood test strip    Sig: 1 each by Other route 2 (two) times daily. Check blood sugar two times daily.    Dispense:  100 each    Refill:  10    DX code E11.51,E11.65 Pt will need 1 touch verio strips.    Problem List Items Addressed This Visit    Essential hypertension   Relevant Medications   metoprolol succinate (TOPROL-XL) 50 MG 24 hr tablet   valsartan-hydrochlorothiazide (DIOVAN-HCT) 320-12.5 MG tablet   Other Relevant Orders   Hemoglobin A1c (Completed)   CBC with Differential/Platelet (Completed)   Comprehensive metabolic panel (Completed)   Lipid panel (Completed)   Microalbumin / creatinine urine ratio (Completed)   POCT urinalysis dipstick    Other Visit Diagnoses    DM (diabetes mellitus) type II uncontrolled, periph vascular disorder ([place])    -  Primary    Relevant Medications    metoprolol succinate (TOPROL-XL) 50 MG 24 hr tablet    valsartan-hydrochlorothiazide (DIOVAN-HCT) 320-12.5 MG tablet    Other Relevant Orders    Hemoglobin A1c (Completed)    CBC with Differential/Platelet (Completed)    Comprehensive metabolic panel (Completed)    Lipid panel (Completed)    Microalbumin / creatinine urine ratio (Completed)    POCT urinalysis dipstick       Follow-up: Return in about 3 months (around 01/29/2016), or if symptoms worsen or fail to improve, for hypertension, diabetes II.  Ann Held, DO

## 2015-10-30 ENCOUNTER — Telehealth: Payer: Self-pay

## 2015-10-30 MED ORDER — GLUCOSE BLOOD VI STRP: 1.0000 | ORAL_STRIP | Freq: Two times a day (BID) | Status: DC

## 2015-10-30 NOTE — Telephone Encounter (Signed)
Entered in error

## 2015-11-06 ENCOUNTER — Other Ambulatory Visit: Payer: Self-pay

## 2015-11-06 MED ORDER — SITAGLIPTIN PHOSPHATE 100 MG PO TABS: 100.0000 mg | ORAL_TABLET | Freq: Every day | ORAL | Status: DC

## 2015-11-09 ENCOUNTER — Telehealth: Payer: Self-pay | Admitting: Family Medicine

## 2015-11-09 NOTE — Telephone Encounter (Signed)
Discussed with patient and I made him aware he needs to pick up the 24 hour urine container, he said he will be here to pick it up tomorrow.     KP

## 2015-11-09 NOTE — Telephone Encounter (Signed)
Caller name: Suvan, Stcyr   Relationship to patient: Wife Can be reached: [phone]   Reason for call: Wife request call back in ref to picking up urine test kit. States she received a call last week.

## 2015-12-30 ENCOUNTER — Other Ambulatory Visit: Payer: Self-pay

## 2015-12-30 MED ORDER — SITAGLIPTIN PHOSPHATE 100 MG PO TABS: 100.0000 mg | ORAL_TABLET | Freq: Every day | ORAL | 1 refills | Status: DC

## 2015-12-30 MED ORDER — GLUCOSE BLOOD VI STRP: ORAL_STRIP | 3 refills | Status: DC

## 2015-12-30 NOTE — Telephone Encounter (Signed)
Patient's insurance is requiring a 90 day supply. Rx faxed.      KP

## 2016-02-11 ENCOUNTER — Telehealth: Payer: Self-pay | Admitting: Oncology

## 2016-02-11 NOTE — Telephone Encounter (Signed)
02/12/2016 Appointment rescheduled to 03/03/2016 per patient request. Patient available late afternoon on that scheduled date.

## 2016-02-12 ENCOUNTER — Other Ambulatory Visit: Payer: BLUE CROSS/BLUE SHIELD

## 2016-02-12 ENCOUNTER — Ambulatory Visit: Payer: BLUE CROSS/BLUE SHIELD | Admitting: Oncology

## 2016-03-03 ENCOUNTER — Other Ambulatory Visit (HOSPITAL_BASED_OUTPATIENT_CLINIC_OR_DEPARTMENT_OTHER): Payer: BLUE CROSS/BLUE SHIELD

## 2016-03-03 ENCOUNTER — Ambulatory Visit (HOSPITAL_BASED_OUTPATIENT_CLINIC_OR_DEPARTMENT_OTHER): Payer: BLUE CROSS/BLUE SHIELD | Admitting: Oncology

## 2016-03-03 VITALS — BP 128/82 | HR 80 | Temp 99.2°F | Resp 18 | Ht 70.0 in | Wt 254.0 lb

## 2016-03-03 DIAGNOSIS — C8280 Other types of follicular lymphoma, unspecified site: Secondary | ICD-10-CM | POA: Diagnosis not present

## 2016-03-03 DIAGNOSIS — C8290 Follicular lymphoma, unspecified, unspecified site: Secondary | ICD-10-CM

## 2016-03-03 LAB — CBC WITH DIFFERENTIAL/PLATELET
BASO%: 0.8 % (ref 0.0–2.0)
Basophils Absolute: 0.1 10*3/uL (ref 0.0–0.1)
EOS%: 4.9 % (ref 0.0–7.0)
Eosinophils Absolute: 0.4 10*3/uL (ref 0.0–0.5)
HCT: 40 % (ref 38.4–49.9)
HGB: 14.5 g/dL (ref 13.0–17.1)
LYMPH%: 26.1 % (ref 14.0–49.0)
MCH: 32.6 pg (ref 27.2–33.4)
MCHC: 36.3 g/dL — AB (ref 32.0–36.0)
MCV: 89.9 fL (ref 79.3–98.0)
MONO#: 0.9 10*3/uL (ref 0.1–0.9)
MONO%: 10.4 % (ref 0.0–14.0)
NEUT#: 5.1 10*3/uL (ref 1.5–6.5)
NEUT%: 57.8 % (ref 39.0–75.0)
Platelets: 196 10*3/uL (ref 140–400)
RBC: 4.45 10*6/uL (ref 4.20–5.82)
RDW: 13.3 % (ref 11.0–14.6)
WBC: 8.8 10*3/uL (ref 4.0–10.3)
lymph#: 2.3 10*3/uL (ref 0.9–3.3)

## 2016-03-03 LAB — COMPREHENSIVE METABOLIC PANEL
ALT: 25 U/L (ref 0–55)
ANION GAP: 8 meq/L (ref 3–11)
AST: 21 U/L (ref 5–34)
Albumin: 3.9 g/dL (ref 3.5–5.0)
Alkaline Phosphatase: 73 U/L (ref 40–150)
BUN: 18.4 mg/dL (ref 7.0–26.0)
CHLORIDE: 102 meq/L (ref 98–109)
CO2: 29 meq/L (ref 22–29)
CREATININE: 0.9 mg/dL (ref 0.7–1.3)
Calcium: 10.1 mg/dL (ref 8.4–10.4)
EGFR: 88 mL/min/{1.73_m2} — ABNORMAL LOW (ref >90)
Glucose: 158 mg/dl — ABNORMAL HIGH (ref 70–140)
Potassium: 4.4 mEq/L (ref 3.5–5.1)
Sodium: 138 mEq/L (ref 136–145)
Total Bilirubin: 0.7 mg/dL (ref 0.20–1.20)
Total Protein: 7 g/dL (ref 6.4–8.3)

## 2016-03-03 LAB — LACTATE DEHYDROGENASE: LDH: 165 U/L (ref 125–245)

## 2016-03-03 NOTE — Progress Notes (Signed)
Hematology and Oncology Follow Up Visit  KAIRAV RUSSOMANNO [id number] [birth year] 65 y.o. 03/03/2016 3:51 PM    CC: [name] [name], MD  [name], [name]  [name], [name]: A 65 year old gentleman with stage IIIA low-grade follicular lymphoma diagnosed May 2010.  Past Therapy:  He is S/P Bendamustine and rituximab started on 08/25/11 for 6 cycles completed in 12/2011.  Current therapy: Observation and surveillance.  Interim History:  Mr. Diel presents today for a followup visit. Since his last visit, he reports no major changes in his health. He reports slight increase in a right knee pain related to his occupation where he is on his feet for an extended period of time.  He denied any fevers, chills or constitutional symptoms. He denied any painful adenopathy. He does not report any abdominal pain or early satiety. He denied any weight loss or changes in his mentation. He denied any hematuria, dysuria or difficulty with urination.  He denies fever, chest pain, shortness of breath. No vomiting reported at this time. He denies any headaches, blurry vision or syncope. He did not report any chest pain, shortness of breath or cough. He does not report any nausea, change in his bowel habits. The remaining review of systems unremarkable.  Medications: I have reviewed the patient's current medications.  Current Outpatient Prescriptions  Medication Sig Dispense Refill  . aspirin 325 MG EC tablet Take 325 mg by mouth daily.      . Blood Glucose Monitoring Suppl ([place]) w/Device KIT by Does not apply route.    . fish oil-omega-3 fatty acids 1000 MG capsule Take 2 g by mouth 2 (two) times daily. Reported on 06/09/2015    . glucose blood test strip Check blood sugar two times daily. 200 each 3  . metoprolol succinate (TOPROL-XL) 50 MG 24 hr tablet Take 1 tablet by mouth bid 180 tablet 3  . sitaGLIPtin (JANUVIA) 100 MG tablet Take 1 tablet (100 mg total) by  mouth daily. 90 tablet 1  . valsartan-hydrochlorothiazide (DIOVAN-HCT) 320-12.5 MG tablet Take 1 tablet by mouth daily. 90 tablet 3   No current facility-administered medications for this visit.     Allergies:  Allergies  Allergen Reactions  . Ampicillin Rash    Past Medical History, Surgical history, Social history, and Family History were reviewed and updated.    Physical Exam: Blood pressure 128/82, pulse 80, temperature 99.2 F (37.3 C), temperature source Oral, resp. rate 18, height '5\' 10"'$  (1.778 m), weight 254 lb (115.2 kg), SpO2 97 %. ECOG: 0 General appearance: Well-appearing gentleman without distress. Head: Normocephalic, without obvious abnormality no oral thrush noted. Neck: no adenopathy or thyromegaly. Lymph nodes: No cervical, inguinal or axillary adenopathy. Heart: Irregular without murmurs. Lung:chest clear, no wheezing, rales, normal symmetric air entry,  Abdomen: soft, non-tender, without masses or organomegaly no rebound or guarding. Skin: No ecchymosis or petechiae. EXT:no erythema, induration, or nodules  Lab Results: Lab Results  Component Value Date   WBC 8.8 03/03/2016   HGB 14.5 03/03/2016   HCT 40.0 03/03/2016   MCV 89.9 03/03/2016   PLT 196 03/03/2016       Impression and Plan: This a pleasant 65 year old gentleman with the following issues: 1. Stage III follicular lymphoma. He is S/P 6 cycles of chemotherapy concluded in March 2013 and tolerated it well overall without any major toxicities. CT scans on 02/11/2015 continues to show excellent response to his previous therapy. His laboratory data and  physical examination do not suggest any evidence of recurrent disease. The plan is to continue with active surveillance and institute salvage therapy upon symptomatic progression. Imaging studies will be obtained as needed if he develops any symptoms or any indication to do so. 2. Atrial fibrillation. Follows up with cardiology. His rate is  controlled.  3. Port management: Port-A-Cath removed in September 2015. No residual complications noted. 4. Follow-up: Will be in 6 months.   EGBTDV,VOHYW 10/5/20173:51 PM

## 2016-03-21 ENCOUNTER — Ambulatory Visit (INDEPENDENT_AMBULATORY_CARE_PROVIDER_SITE_OTHER): Payer: BLUE CROSS/BLUE SHIELD | Admitting: Family Medicine

## 2016-03-21 ENCOUNTER — Encounter: Payer: Self-pay | Admitting: Family Medicine

## 2016-03-21 VITALS — BP 136/82 | HR 88 | Temp 99.0°F | Ht 70.0 in | Wt 255.2 lb

## 2016-03-21 DIAGNOSIS — E1165 Type 2 diabetes mellitus with hyperglycemia: Secondary | ICD-10-CM

## 2016-03-21 DIAGNOSIS — I10 Essential (primary) hypertension: Secondary | ICD-10-CM | POA: Diagnosis not present

## 2016-03-21 DIAGNOSIS — E785 Hyperlipidemia, unspecified: Secondary | ICD-10-CM

## 2016-03-21 DIAGNOSIS — IMO0002 Reserved for concepts with insufficient information to code with codable children: Secondary | ICD-10-CM

## 2016-03-21 DIAGNOSIS — M1711 Unilateral primary osteoarthritis, right knee: Secondary | ICD-10-CM

## 2016-03-21 DIAGNOSIS — E118 Type 2 diabetes mellitus with unspecified complications: Secondary | ICD-10-CM | POA: Diagnosis not present

## 2016-03-21 DIAGNOSIS — C8280 Other types of follicular lymphoma, unspecified site: Secondary | ICD-10-CM

## 2016-03-21 DIAGNOSIS — R319 Hematuria, unspecified: Secondary | ICD-10-CM

## 2016-03-21 LAB — POCT URINALYSIS DIPSTICK
Bilirubin, UA: NEGATIVE
GLUCOSE UA: NEGATIVE
Ketones, UA: NEGATIVE
LEUKOCYTES UA: NEGATIVE
NITRITE UA: NEGATIVE
Protein, UA: NEGATIVE
Spec Grav, UA: 1.02
UROBILINOGEN UA: 0.2
pH, UA: 5.5

## 2016-03-21 LAB — LIPID PANEL
CHOLESTEROL: 119 mg/dL (ref 0–200)
HDL: 33.2 mg/dL — AB (ref >39.00)
LDL Cholesterol: 75 mg/dL (ref 0–99)
NONHDL: 85.89
TRIGLYCERIDES: 53 mg/dL (ref 0.0–149.0)
Total CHOL/HDL Ratio: 4
VLDL: 10.6 mg/dL (ref 0.0–40.0)

## 2016-03-21 LAB — COMPREHENSIVE METABOLIC PANEL
ALK PHOS: 61 U/L (ref 39–117)
ALT: 25 U/L (ref 0–53)
AST: 21 U/L (ref 0–37)
Albumin: 4.3 g/dL (ref 3.5–5.2)
BILIRUBIN TOTAL: 0.8 mg/dL (ref 0.2–1.2)
BUN: 18 mg/dL (ref 6–23)
CALCIUM: 10 mg/dL (ref 8.4–10.5)
CO2: 32 mEq/L (ref 19–32)
Chloride: 101 mEq/L (ref 96–112)
Creatinine, Ser: 0.89 mg/dL (ref 0.40–1.50)
GFR: 91.5 mL/min (ref >60.00)
Glucose, Bld: 148 mg/dL — ABNORMAL HIGH (ref 70–99)
POTASSIUM: 3.8 meq/L (ref 3.5–5.1)
Sodium: 138 mEq/L (ref 135–145)
TOTAL PROTEIN: 7 g/dL (ref 6.0–8.3)

## 2016-03-21 LAB — HEMOGLOBIN A1C: Hgb A1c MFr Bld: 8.1 % — ABNORMAL HIGH (ref 4.6–6.5)

## 2016-03-21 MED ORDER — METOPROLOL SUCCINATE ER 50 MG PO TB24: ORAL_TABLET | ORAL | 3 refills | Status: DC

## 2016-03-21 MED ORDER — VALSARTAN-HYDROCHLOROTHIAZIDE 320-12.5 MG PO TABS: 1.0000 | ORAL_TABLET | Freq: Every day | ORAL | 3 refills | Status: DC

## 2016-03-21 MED ORDER — SITAGLIPTIN PHOSPHATE 100 MG PO TABS: 100.0000 mg | ORAL_TABLET | Freq: Every day | ORAL | 1 refills | Status: DC

## 2016-03-21 MED ORDER — GLUCOSE BLOOD VI STRP: ORAL_STRIP | 3 refills | Status: DC

## 2016-03-21 MED ORDER — CELECOXIB 100 MG PO CAPS: 100.0000 mg | ORAL_CAPSULE | Freq: Every day | ORAL | 3 refills | Status: DC

## 2016-03-21 MED ORDER — ONETOUCH ULTRASOFT LANCETS MISC: 12 refills | Status: DC

## 2016-03-21 NOTE — Progress Notes (Signed)
Pre visit review using our clinic review tool, if applicable. No additional management support is needed unless otherwise documented below in the visit note. 

## 2016-03-21 NOTE — Patient Instructions (Signed)

## 2016-03-21 NOTE — Progress Notes (Signed)
Patient ID: Tom Fuller, male    DOB: 1951/02/09  Age: 65 y.o. MRN: [id number]    Subjective:  Subjective  HPI Tom Fuller presents for f/u dm, cholesterol and bp.    HPI HYPERTENSION   Blood pressure range-not checking   Chest pain- no      Dyspnea- no Lightheadedness- no   Edema- no  Other side effects - no   Medication compliance: giood Low salt diet- yes    DIABETES    Blood Sugar ranges-high per pt  Polyuria- no New Visual problems- no  Hypoglycemic symptoms- no  Other side effects-no Medication compliance - good Last eye exam- due Foot exam- today   HYPERLIPIDEMIA  Medication compliance- good RUQ pain- no  Muscle aches- no Other side effects-no   Review of Systems  Constitutional: Negative for appetite change, diaphoresis, fatigue and unexpected weight change.  Eyes: Negative for pain, redness and visual disturbance.  Respiratory: Negative for cough, chest tightness, shortness of breath and wheezing.   Cardiovascular: Negative for chest pain, palpitations and leg swelling.  Endocrine: Negative for cold intolerance, heat intolerance, polydipsia, polyphagia and polyuria.  Genitourinary: Negative for difficulty urinating, dysuria and frequency.  Neurological: Negative for dizziness, light-headedness, numbness and headaches.    History Past Medical History:  Diagnosis Date  . Atrial fibrillation ([place])   . Cervical lymphadenopathy   . Erectile dysfunction   . GERD (gastroesophageal reflux disease)   . Hypertension   . Lymphoma, follicular ([place])    Dr.Shadad    He has a past surgical history that includes Vasectomy; a bsc; Abscess drainage; and Deep neck lymph node biopsy / excision (2011).   His family history includes Diabetes in his paternal grandfather; Hypertension in his brother, father, mother, and paternal grandfather; Kidney disease in his maternal aunt, maternal grandmother, and mother.He reports that he has never smoked. He has never used  smokeless tobacco. He reports that he does not drink alcohol or use drugs.  Current Outpatient Prescriptions on File Prior to Visit  Medication Sig Dispense Refill  . aspirin 325 MG EC tablet Take 325 mg by mouth daily.      . Blood Glucose Monitoring Suppl ([place]) w/Device KIT by Does not apply route.    . fish oil-omega-3 fatty acids 1000 MG capsule Take 2 g by mouth 2 (two) times daily. Reported on 06/09/2015     No current facility-administered medications on file prior to visit.      Objective:  Objective  Physical Exam  Constitutional: He is oriented to person, place, and time. Vital signs are normal. He appears well-developed and well-nourished. He is sleeping.  HENT:  Head: Normocephalic and atraumatic.  Mouth/Throat: Oropharynx is clear and moist.  Eyes: EOM are normal. Pupils are equal, round, and reactive to light.  Neck: Normal range of motion. Neck supple. No thyromegaly present.  Cardiovascular: Normal rate and regular rhythm.   No murmur heard. Pulmonary/Chest: Effort normal and breath sounds normal. No respiratory distress. He has no wheezes. He has no rales. He exhibits no tenderness.  Musculoskeletal: He exhibits no edema or tenderness.  Neurological: He is alert and oriented to person, place, and time.  Skin: Skin is warm and dry.  Psychiatric: He has a normal mood and affect. His behavior is normal. Judgment and thought content normal.  Nursing note and vitals reviewed. Sensory exam of the foot is normal, tested with the monofilament. Good pulses, no lesions or ulcers, good peripheral pulses.  BP  136/82 (BP Location: Left Arm, Patient Position: Sitting, Cuff Size: Normal)   Pulse 88   Temp 99 F (37.2 C) (Oral)   Ht _0  (1.778 m)   Wt 255 lb 3.2 oz (115.8 kg)   SpO2 98%   BMI 36.62 kg/m  Wt Readings from Last 3 Encounters:  03/21/16 255 lb 3.2 oz (115.8 kg)  03/03/16 254 lb (115.2 kg)  10/29/15 248 lb 12.8 oz (112.9 kg)     Lab  Results  Component Value Date   WBC 8.8 03/03/2016   HGB 14.5 03/03/2016   HCT 40.0 03/03/2016   PLT 196 03/03/2016   GLUCOSE 148 (H) 03/21/2016   CHOL 119 03/21/2016   TRIG 53.0 03/21/2016   HDL 33.20 (L) 03/21/2016   LDLCALC 75 03/21/2016   ALT 25 03/21/2016   AST 21 03/21/2016   NA 138 03/21/2016   K 3.8 03/21/2016   CL 101 03/21/2016   CREATININE 0.89 03/21/2016   BUN 18 03/21/2016   CO2 32 03/21/2016   TSH 1.69 09/23/2010   PSA 0.32 09/23/2010   INR 1.07 02/13/2014   HGBA1C 8.1 (H) 03/21/2016   MICROALBUR 16.4 (H) 10/29/2015    Ct Soft Tissue Neck W Contrast  Result Date: 02/11/2015 CLINICAL DATA:  Follicular lymphoma follow-up. No current complaints. EXAM: CT NECK WITH CONTRAST TECHNIQUE: Multidetector CT imaging of the neck was performed using the standard protocol following the bolus administration of intravenous contrast. CONTRAST:  162m OMNIPAQUE IOHEXOL 300 MG/ML  SOLN COMPARISON:  02/04/2014 FINDINGS: Pharynx and larynx: Nasopharynx is unremarkable. Mild symmetric palatine tonsillar soft tissue prominence is unchanged without a discrete mass. Asymmetry of the piriform sinuses is similar to the 02/11/2013 study without a hypopharyngeal or laryngeal mass. Salivary glands: Submandibular and parotid glands are unremarkable. Thyroid: Low-density, partially calcified nodules involving the thyroid isthmus and anterior left lobe are grossly unchanged, measuring up to approximately 1.8 cm in size. Lymph nodes: Small bilateral cervical lymph nodes are unchanged from the prior study, the largest measuring 9 mm in short axis in level IIA. Vascular: Major vascular structures of the neck appear patent. Mild atherosclerotic plaque at the left carotid bifurcation. Right vertebral artery is dominant. Limited intracranial: The visualized portion of the brain is unremarkable. Visualized orbits: Unremarkable. Mastoids and visualized paranasal sinuses: Chronic bilateral maxillary sinusitis. Small  amount of fluid in the left maxillary sinus, increased from prior. Small bilateral mastoid effusions. Skeleton: Moderate lower cervical spondylosis. Upper chest: Evaluated on concurrent chest CT. IMPRESSION: 1. Small bilateral lymph nodes throughout the neck, unchanged. 2. Chronic bilateral maxillary sinusitis with increased left maxillary sinus fluid which could reflect superimposed acute sinusitis. Electronically Signed   By: ALogan BoresM.D.   On: 02/11/2015 17:04   Ct Chest W Contrast  Result Date: 02/11/2015 CLINICAL DATA:  Restaging follicular lymphoma. EXAM: CT CHEST, ABDOMEN, AND PELVIS WITH CONTRAST TECHNIQUE: Multidetector CT imaging of the chest, abdomen and pelvis was performed following the standard protocol during bolus administration of intravenous contrast. CONTRAST:  1044mOMNIPAQUE IOHEXOL 300 MG/ML  SOLN COMPARISON:  CT scan 02/04/2014 FINDINGS: CT CHEST FINDINGS Chest wall: No chest wall mass, supraclavicular or axillary lymphadenopathy. Small scattered lymph nodes are stable. Small thyroid gland nodules are stable. The bony thorax is intact. No destructive bone lesions or spinal canal compromise. Mediastinum: The heart is normal in size and stable. No pericardial effusion. Mild tortuosity of the thoracic aorta but no aneurysm or dissection. The branch vessels are patent. The esophagus is grossly normal.  There are small scattered stable mediastinal and hilar lymph nodes. 8.5 mm right paratracheal node on image 9 is unchanged. 7 mm subcarinal node on image 24 is unchanged. 10 mm retroesophageal lymph node on image 20 measures 10.5 mm and previously measured 9.5 mm. Lungs/pleura: No acute pulmonary findings. No worrisome pulmonary lesions. No pleural effusion. CT ABDOMEN AND PELVIS FINDINGS Hepatobiliary: No focal hepatic lesions or intrahepatic biliary dilatation. The gallbladder is normal. No common bile duct dilatation. Pancreas: Mild fatty change involving the pancreas but no mass,  inflammation or ductal dilatation. Spleen: Normal size.  No focal lesions. Adrenals/Urinary Tract: The adrenal glands are normal and stable. Stable small left renal cyst. No worrisome renal lesions or hydronephrosis. Stomach/Bowel: The stomach, duodenum, small bowel and colon are unremarkable. No inflammatory changes, mass lesions or obstructive findings. Vascular/Lymphatic: Persistent hazy appearance of the small bowel mesenteric with numerous small mesenteric lymph nodes. Index lymph node on image number 61 measures 10 mm and is unchanged. Stable retroperitoneal lymphadenopathy. Index node on image number 70 measures 19 x 12 mm and previously measured 18 x 13 mm. Left-sided iliac lymph node on image number 92 measures 9 mm and is stable. Other: The bladder, prostate gland and seminal vesicles are unremarkable. Small scattered pelvic lymph nodes are stable. No inguinal adenopathy. Musculoskeletal: No significant bony findings. IMPRESSION: 1. Stable small scattered mediastinal lymph nodes, mesenteric lymph nodes and retroperitoneal lymph nodes. No findings for progressive adenopathy or new adenopathy. 2. No acute pulmonary findings. 3. No acute abdominal/pelvic findings or mass lesions and no organomegaly. Electronically Signed   By: Marijo Sanes [name]   On: 02/11/2015 17:14   Ct Abdomen Pelvis W Contrast  Result Date: 02/11/2015 CLINICAL DATA:  Restaging follicular lymphoma. EXAM: CT CHEST, ABDOMEN, AND PELVIS WITH CONTRAST TECHNIQUE: Multidetector CT imaging of the chest, abdomen and pelvis was performed following the standard protocol during bolus administration of intravenous contrast. CONTRAST:  185m OMNIPAQUE IOHEXOL 300 MG/ML  SOLN COMPARISON:  CT scan 02/04/2014 FINDINGS: CT CHEST FINDINGS Chest wall: No chest wall mass, supraclavicular or axillary lymphadenopathy. Small scattered lymph nodes are stable. Small thyroid gland nodules are stable. The bony thorax is intact. No destructive bone lesions or  spinal canal compromise. Mediastinum: The heart is normal in size and stable. No pericardial effusion. Mild tortuosity of the thoracic aorta but no aneurysm or dissection. The branch vessels are patent. The esophagus is grossly normal. There are small scattered stable mediastinal and hilar lymph nodes. 8.5 mm right paratracheal node on image 9 is unchanged. 7 mm subcarinal node on image 24 is unchanged. 10 mm retroesophageal lymph node on image 20 measures 10.5 mm and previously measured 9.5 mm. Lungs/pleura: No acute pulmonary findings. No worrisome pulmonary lesions. No pleural effusion. CT ABDOMEN AND PELVIS FINDINGS Hepatobiliary: No focal hepatic lesions or intrahepatic biliary dilatation. The gallbladder is normal. No common bile duct dilatation. Pancreas: Mild fatty change involving the pancreas but no mass, inflammation or ductal dilatation. Spleen: Normal size.  No focal lesions. Adrenals/Urinary Tract: The adrenal glands are normal and stable. Stable small left renal cyst. No worrisome renal lesions or hydronephrosis. Stomach/Bowel: The stomach, duodenum, small bowel and colon are unremarkable. No inflammatory changes, mass lesions or obstructive findings. Vascular/Lymphatic: Persistent hazy appearance of the small bowel mesenteric with numerous small mesenteric lymph nodes. Index lymph node on image number 61 measures 10 mm and is unchanged. Stable retroperitoneal lymphadenopathy. Index node on image number 70 measures 19 x 12 mm and previously measured 18  x 13 mm. Left-sided iliac lymph node on image number 92 measures 9 mm and is stable. Other: The bladder, prostate gland and seminal vesicles are unremarkable. Small scattered pelvic lymph nodes are stable. No inguinal adenopathy. Musculoskeletal: No significant bony findings. IMPRESSION: 1. Stable small scattered mediastinal lymph nodes, mesenteric lymph nodes and retroperitoneal lymph nodes. No findings for progressive adenopathy or new adenopathy. 2.  No acute pulmonary findings. 3. No acute abdominal/pelvic findings or mass lesions and no organomegaly. Electronically Signed   By: Marijo Sanes [name]   On: 02/11/2015 17:14     Assessment & Plan:  Plan  I have discontinued Tom Fuller's sitaGLIPtin. I am also having him start on onetouch ultrasoft and celecoxib. Additionally, I am having him maintain his aspirin, fish oil-omega-3 fatty acids, ONETOUCH VERIO SYNC SYSTEM, glucose blood, valsartan-hydrochlorothiazide, and metoprolol succinate.  Meds ordered this encounter  Medications  . DISCONTD: metoprolol succinate (TOPROL-XL) 50 MG 24 hr tablet    Sig: Take 1 tablet by mouth bid    Dispense:  180 tablet    Refill:  3  . glucose blood test strip    Sig: Check blood sugar two times daily.    Dispense:  200 each    Refill:  3    DX code E11.51,E11.65 Pt will need 1 touch verio strips.  . Lancets (ONETOUCH ULTRASOFT) lancets    Sig: Use as instructed    Dispense:  100 each    Refill:  12    One touch verio---- delica  . celecoxib (CELEBREX) 100 MG capsule    Sig: Take 1 capsule (100 mg total) by mouth daily.    Dispense:  90 capsule    Refill:  3  . valsartan-hydrochlorothiazide (DIOVAN-HCT) 320-12.5 MG tablet    Sig: Take 1 tablet by mouth daily.    Dispense:  90 tablet    Refill:  3  . DISCONTD: sitaGLIPtin (JANUVIA) 100 MG tablet    Sig: Take 1 tablet (100 mg total) by mouth daily.    Dispense:  90 tablet    Refill:  1  . DISCONTD: metoprolol succinate (TOPROL-XL) 50 MG 24 hr tablet    Sig: Take 1 tablet by mouth bid    Dispense:  180 tablet    Refill:  3  . metoprolol succinate (TOPROL-XL) 50 MG 24 hr tablet    Sig: Take 1 tablet by mouth bid    Dispense:  180 tablet    Refill:  3    Problem List Items Addressed This Visit      Unprioritized   Essential hypertension    Stable , cont meds      Relevant Medications   valsartan-hydrochlorothiazide (DIOVAN-HCT) 320-12.5 MG tablet   metoprolol succinate (TOPROL-XL) 50  MG 24 hr tablet   Other Relevant Orders   POCT urinalysis dipstick (Completed)   Hyperlipidemia LDL goal <70   Relevant Medications   valsartan-hydrochlorothiazide (DIOVAN-HCT) 320-12.5 MG tablet   metoprolol succinate (TOPROL-XL) 50 MG 24 hr tablet   Other Relevant Orders   Comprehensive metabolic panel (Completed)   Lipid panel (Completed)   POCT urinalysis dipstick (Completed)   Lymphoma (HCC)    Per oncology      Relevant Medications   celecoxib (CELEBREX) 100 MG capsule   Morbid obesity (HCC)    con't diet and exercise      Uncontrolled type 2 diabetes mellitus with complication, without long-term current use of insulin (HCC) - Primary    Con't [place] and farxiga Check  labs      Relevant Medications   glucose blood test strip   Lancets (ONETOUCH ULTRASOFT) lancets   valsartan-hydrochlorothiazide (DIOVAN-HCT) 320-12.5 MG tablet   Other Relevant Orders   Comprehensive metabolic panel (Completed)   Hemoglobin A1c (Completed)   POCT urinalysis dipstick (Completed)    Other Visit Diagnoses    Primary osteoarthritis of right knee       Relevant Medications   celecoxib (CELEBREX) 100 MG capsule   Hematuria, unspecified type       Relevant Orders   Urine Culture (Completed)      Follow-up: Return in about 3 months (around 06/21/2016) for hypertension, hyperlipidemia, diabetes II.  Ann Held, DO

## 2016-03-23 LAB — URINE CULTURE: ORGANISM ID, BACTERIA: NO GROWTH

## 2016-03-24 ENCOUNTER — Other Ambulatory Visit: Payer: Self-pay

## 2016-03-24 DIAGNOSIS — IMO0002 Reserved for concepts with insufficient information to code with codable children: Secondary | ICD-10-CM

## 2016-03-24 DIAGNOSIS — E118 Type 2 diabetes mellitus with unspecified complications: Principal | ICD-10-CM

## 2016-03-24 DIAGNOSIS — E1165 Type 2 diabetes mellitus with hyperglycemia: Secondary | ICD-10-CM

## 2016-03-24 MED ORDER — DAPAGLIFLOZIN PROPANEDIOL 5 MG PO TABS: 5.0000 mg | ORAL_TABLET | Freq: Every day | ORAL | 2 refills | Status: DC

## 2016-03-24 MED ORDER — SITAGLIPTIN PHOSPHATE 100 MG PO TABS: 100.0000 mg | ORAL_TABLET | Freq: Every day | ORAL | 1 refills | Status: DC

## 2016-03-26 DIAGNOSIS — E1122 Type 2 diabetes mellitus with diabetic chronic kidney disease: Secondary | ICD-10-CM | POA: Insufficient documentation

## 2016-03-26 DIAGNOSIS — E785 Hyperlipidemia, unspecified: Secondary | ICD-10-CM | POA: Insufficient documentation

## 2016-03-26 MED ORDER — METOPROLOL SUCCINATE ER 50 MG PO TB24: ORAL_TABLET | ORAL | 3 refills | Status: DC

## 2016-03-26 NOTE — Assessment & Plan Note (Signed)
con't diet and exercise  

## 2016-03-26 NOTE — Assessment & Plan Note (Signed)
Con't [place] and farxiga Check labs

## 2016-03-26 NOTE — Assessment & Plan Note (Signed)
Per oncology °

## 2016-03-26 NOTE — Assessment & Plan Note (Signed)
Stable , con't meds  

## 2016-03-26 NOTE — Assessment & Plan Note (Signed)
>>  ASSESSMENT AND PLAN FOR ESSENTIAL HYPERTENSION WRITTEN ON 03/26/2016  5:10 PM BY LOWNE CHASE, YVONNE R, DO  Stable , cont meds

## 2016-05-04 ENCOUNTER — Encounter: Payer: Self-pay | Admitting: Family Medicine

## 2016-05-04 ENCOUNTER — Ambulatory Visit (INDEPENDENT_AMBULATORY_CARE_PROVIDER_SITE_OTHER): Payer: BLUE CROSS/BLUE SHIELD | Admitting: Family Medicine

## 2016-05-04 VITALS — BP 120/76 | HR 86 | Temp 98.0°F | Ht 70.0 in | Wt 253.4 lb

## 2016-05-04 DIAGNOSIS — J209 Acute bronchitis, unspecified: Secondary | ICD-10-CM | POA: Diagnosis not present

## 2016-05-04 MED ORDER — DOXYCYCLINE HYCLATE 100 MG PO CAPS: 100.0000 mg | ORAL_CAPSULE | Freq: Two times a day (BID) | ORAL | 0 refills | Status: DC

## 2016-05-04 NOTE — Patient Instructions (Signed)
We are going to treat your for bronchitis with doxycycline antibiotic- take it twice a day for 10 days Let me know if you are not feeling better soon- Sooner if worse.

## 2016-05-04 NOTE — Progress Notes (Signed)
[place] at [facility] [address], [place],  [address] [phone]  Date:  05/04/2016   Name:  Tom Fuller   DOB:  10-15-1950   MRN:  [id number]  PCP:  Ann Held, DO    Chief Complaint: Cough (c/o cough x 3 weeks that keeps coming and going. )   History of Present Illness:  Tom Fuller is a 65 y.o. very pleasant male patient who presents with the following:  History of DM, dyslipidemia, obesity, atrial fib, lymphoma.  Hi He has noted a cough for about 3 weeks. It would wax and wane- got worse again about 5 days ago He has not noted any fever The cough is not productive but he feels congested in his chest He has noted some runny nose, off an on No ST or earache No GI symptoms He has not noted any wheezing He is a never smoker  He does have atrial fib and is on metoprolol for rate control, but he is not on coumadin He did have lymphona but he is in remission He is allergic to ampicillin- nothing else  He started farxiga about 30 days ago and is tolerating it well so far   Lab Results  Component Value Date   HGBA1C 8.1 (H) 03/21/2016     Patient Active Problem List   Diagnosis Date Noted  . Uncontrolled type 2 diabetes mellitus with complication, without long-term current use of insulin ([place]) 03/26/2016  . Hyperlipidemia LDL goal <70 03/26/2016  . Left otitis media 06/17/2015  . Morbid obesity ([place]) 04/23/2014  . Preventative health care 09/23/2010  . ATRIAL FIBRILLATION 09/09/2008  . Lymphoma ([place]) 08/07/2008  . Enlarged lymph nodes 07/07/2008  . GERD 07/25/2007  . ERECTILE DYSFUNCTION 03/15/2007  . Essential hypertension 02/27/2007    Past Medical History:  Diagnosis Date  . Atrial fibrillation ([place])   . Cervical lymphadenopathy   . Erectile dysfunction   . GERD (gastroesophageal reflux disease)   . Hypertension   . Lymphoma, follicular ([place])    Dr.Shadad    Past Surgical  History:  Procedure Laterality Date  . a bsc    . ABSCESS DRAINAGE     under rt eye   . DEEP NECK LYMPH NODE BIOPSY / EXCISION  2011  . VASECTOMY      Social History  Substance Use Topics  . Smoking status: Never Smoker  . Smokeless tobacco: Never Used  . Alcohol use No    Family History  Problem Relation Age of Onset  . Hypertension Mother   . Kidney disease Mother   . Hypertension Father   . Hypertension Brother   . Diabetes Paternal Grandfather   . Hypertension Paternal Grandfather   . Kidney disease Maternal Aunt   . Kidney disease Maternal Grandmother     Allergies  Allergen Reactions  . Ampicillin Rash    Medication list has been reviewed and updated.  Current Outpatient Prescriptions on File Prior to Visit  Medication Sig Dispense Refill  . aspirin 325 MG EC tablet Take 325 mg by mouth daily.      . Blood Glucose Monitoring Suppl ([place]) w/Device KIT by Does not apply route.    . dapagliflozin propanediol (FARXIGA) 5 MG TABS tablet Take 5 mg by mouth daily. 30 tablet 2  . glucose blood test strip Check blood sugar two times daily. 200 each 3  . Lancets (ONETOUCH ULTRASOFT)  lancets Use as instructed 100 each 12  . metoprolol succinate (TOPROL-XL) 50 MG 24 hr tablet Take 1 tablet by mouth bid 180 tablet 3  . sitaGLIPtin (JANUVIA) 100 MG tablet Take 1 tablet (100 mg total) by mouth daily. 90 tablet 1  . valsartan-hydrochlorothiazide (DIOVAN-HCT) 320-12.5 MG tablet Take 1 tablet by mouth daily. 90 tablet 3  . celecoxib (CELEBREX) 100 MG capsule Take 1 capsule (100 mg total) by mouth daily. (Patient not taking: Reported on 05/04/2016) 90 capsule 3  . fish oil-omega-3 fatty acids 1000 MG capsule Take 2 g by mouth 2 (two) times daily. Reported on 06/09/2015     No current facility-administered medications on file prior to visit.     Review of Systems:  As per HPI- otherwise negative.  Wt Readings from Last 3 Encounters:  05/04/16 253 lb 6.4  oz (114.9 kg)  03/21/16 255 lb 3.2 oz (115.8 kg)  03/03/16 254 lb (115.2 kg)      Physical Examination: Vitals:   05/04/16 1632  BP: 120/76  Pulse: 86  Temp: 98 F (36.7 C)   Vitals:   05/04/16 1632  Weight: 253 lb 6.4 oz (114.9 kg)  Height: _0  (1.778 m)   Body mass index is 36.36 kg/m. Ideal Body Weight: Weight in (lb) to have BMI = 25: 173.9  GEN: WDWN, NAD, Non-toxic, A & O x 3, obese, looks well HEENT: Atraumatic, Normocephalic. Neck supple. No masses, No LAD.  Bilateral TM wnl, oropharynx normal.  PEERL,EOMI.   Ears and Nose: No external deformity. CV: RRR, No M/G/R. No JVD. No thrill. No extra heart sounds. PULM: CTA B, no wheezes, crackles, rhonchi. No retractions. No resp. distress. No accessory muscle use. EXTR: No c/c/e NEURO Normal gait.  PSYCH: Normally interactive. Conversant. Not depressed or anxious appearing.  Calm demeanor.    Assessment and Plan: Acute bronchitis, unspecified organism - Plan: doxycycline (VIBRAMYCIN) 100 MG capsule  Here today with cough for several weeks, started to get better and then returned.  Will treat for bronchitis with doxycycline  Note for work today He will let me know if not feeling better in the next few days- Sooner if worse.    Signed Lamar Blinks, MD

## 2016-05-04 NOTE — Progress Notes (Signed)
Pre visit review using our clinic review tool, if applicable. No additional management support is needed unless otherwise documented below in the visit note. 

## 2016-05-30 ENCOUNTER — Other Ambulatory Visit: Payer: Self-pay | Admitting: Nurse Practitioner

## 2016-06-01 ENCOUNTER — Other Ambulatory Visit: Payer: Self-pay | Admitting: *Deleted

## 2016-06-01 MED ORDER — DAPAGLIFLOZIN PROPANEDIOL 5 MG PO TABS: 5.0000 mg | ORAL_TABLET | Freq: Every day | ORAL | 0 refills | Status: DC

## 2016-06-07 ENCOUNTER — Telehealth: Payer: Self-pay | Admitting: *Deleted

## 2016-06-07 NOTE — Telephone Encounter (Signed)
[facility] sent a fax over:  We spoke to your patient about diabetes care and noticed your patient has not [facility] in the last 180 days.  Your patient would like [place] to reach out their behalf to determine if it is appropriate to start a statin therapy.  Please send a new prescription for statin therapy if it is appropriate.

## 2016-06-08 NOTE — Telephone Encounter (Signed)
Note faxed back to pharmacy.

## 2016-06-08 NOTE — Telephone Encounter (Signed)
Pt is due for labs this month Will start at that point

## 2016-06-21 ENCOUNTER — Ambulatory Visit (INDEPENDENT_AMBULATORY_CARE_PROVIDER_SITE_OTHER): Payer: BLUE CROSS/BLUE SHIELD | Admitting: Family Medicine

## 2016-06-21 ENCOUNTER — Encounter: Payer: Self-pay | Admitting: Family Medicine

## 2016-06-21 VITALS — BP 118/70 | HR 79 | Temp 98.1°F | Resp 16 | Ht 70.0 in | Wt 247.6 lb

## 2016-06-21 DIAGNOSIS — Z1159 Encounter for screening for other viral diseases: Secondary | ICD-10-CM

## 2016-06-21 DIAGNOSIS — IMO0001 Reserved for inherently not codable concepts without codable children: Secondary | ICD-10-CM

## 2016-06-21 DIAGNOSIS — E1165 Type 2 diabetes mellitus with hyperglycemia: Secondary | ICD-10-CM

## 2016-06-21 DIAGNOSIS — I159 Secondary hypertension, unspecified: Secondary | ICD-10-CM

## 2016-06-21 DIAGNOSIS — E785 Hyperlipidemia, unspecified: Secondary | ICD-10-CM

## 2016-06-21 NOTE — Progress Notes (Signed)
Subjective:    Patient ID: Tom Fuller, male    DOB: June 05, 1950, 66 y.o.   MRN: [id number]  Chief Complaint  Patient presents with  . Diabetes    follow up  . Hypertension    follow up    HPI Patient is in today for hypertension and diabetes follow up appointment.  HPI HYPERTENSION   Blood pressure range-  135/79  Chest pain- no      Dyspnea- no Lightheadedness- no   Edema- no  Other side effects - no   Medication compliance: good Low salt diet- yes    DIABETES Blood Sugar ranges-      114 - 202 Polyuria- no New Visual problems- no  Hypoglycemic symptoms- no  Other side effects-no Medication compliance - good Last eye exam- due Foot exam- today   HYPERLIPIDEMIA  Medication compliance- good RUQ pain- no  Muscle aches- no Other side effects-no    Past Medical History:  Diagnosis Date  . Atrial fibrillation ([place])   . Cervical lymphadenopathy   . Erectile dysfunction   . GERD (gastroesophageal reflux disease)   . Hypertension   . Lymphoma, follicular ([place])    Dr.Shadad    Past Surgical History:  Procedure Laterality Date  . a bsc    . ABSCESS DRAINAGE     under rt eye   . DEEP NECK LYMPH NODE BIOPSY / EXCISION  2011  . VASECTOMY      Family History  Problem Relation Age of Onset  . Hypertension Mother   . Kidney disease Mother   . Hypertension Father   . Hypertension Brother   . Diabetes Paternal Grandfather   . Hypertension Paternal Grandfather   . Kidney disease Maternal Aunt   . Kidney disease Maternal Grandmother     Social History   Social History  . Marital status: Married    Spouse name: N/A  . Number of children: N/A  . Years of education: N/A   Occupational History  . Not on file.   Social History Main Topics  . Smoking status: Never Smoker  . Smokeless tobacco: Never Used  . Alcohol use No  . Drug use: No  . Sexual activity: Yes    Partners: Female   Other Topics Concern  . Not on file   Social History Narrative    . No narrative on file    Outpatient Medications Prior to Visit  Medication Sig Dispense Refill  . aspirin 325 MG EC tablet Take 325 mg by mouth daily.      . Blood Glucose Monitoring Suppl ([place]) w/Device KIT by Does not apply route.    . celecoxib (CELEBREX) 100 MG capsule Take 1 capsule (100 mg total) by mouth daily. 90 capsule 3  . dapagliflozin propanediol (FARXIGA) 5 MG TABS tablet Take 5 mg by mouth daily. 90 tablet 0  . fish oil-omega-3 fatty acids 1000 MG capsule Take 2 g by mouth 2 (two) times daily. Reported on 06/09/2015    . glucose blood test strip Check blood sugar two times daily. 200 each 3  . Lancets (ONETOUCH ULTRASOFT) lancets Use as instructed 100 each 12  . metoprolol succinate (TOPROL-XL) 50 MG 24 hr tablet Take 1 tablet by mouth bid 180 tablet 3  . sitaGLIPtin (JANUVIA) 100 MG tablet Take 1 tablet (100 mg total) by mouth daily. 90 tablet 1  . valsartan-hydrochlorothiazide (DIOVAN-HCT) 320-12.5 MG tablet Take 1 tablet by mouth daily. 90 tablet 3  .  doxycycline (VIBRAMYCIN) 100 MG capsule Take 1 capsule (100 mg total) by mouth 2 (two) times daily. 20 capsule 0   No facility-administered medications prior to visit.     Allergies  Allergen Reactions  . Ampicillin Rash    Review of Systems  Constitutional: Negative.  Negative for fever.  HENT: Negative.  Negative for congestion.   Eyes: Negative.  Negative for blurred vision.  Respiratory: Negative.  Negative for cough.   Cardiovascular: Negative.  Negative for chest pain and palpitations.  Gastrointestinal: Negative.  Negative for vomiting.  Genitourinary: Negative.   Musculoskeletal: Negative.  Negative for back pain.  Skin: Negative.  Negative for rash.  Neurological: Negative.  Negative for loss of consciousness and headaches.  Endo/Heme/Allergies: Negative.   Psychiatric/Behavioral: Negative.        Objective:    Physical Exam  Constitutional: He is oriented to person, place,  and time. Vital signs are normal. He appears well-developed and well-nourished. He is sleeping.  HENT:  Head: Normocephalic and atraumatic.  Mouth/Throat: Oropharynx is clear and moist.  Eyes: EOM are normal. Pupils are equal, round, and reactive to light.  Neck: Normal range of motion. Neck supple. No thyromegaly present.  Cardiovascular: Normal rate and regular rhythm.   No murmur heard. Pulmonary/Chest: Effort normal and breath sounds normal. No respiratory distress. He has no wheezes. He has no rales. He exhibits no tenderness.  Musculoskeletal: He exhibits no edema or tenderness.  Neurological: He is alert and oriented to person, place, and time.  Skin: Skin is warm and dry.  Psychiatric: He has a normal mood and affect. His behavior is normal. Judgment and thought content normal.  Nursing note and vitals reviewed. Sensory exam of the foot is normal, tested with the monofilament. Good pulses, no lesions or ulcers, good peripheral pulses.  BP 118/70 (BP Location: Left Arm)   Pulse 79   Temp 98.1 F (36.7 C) (Oral)   Resp 16   Ht '5\' 10"'$  (1.778 m)   Wt 247 lb 9.6 oz (112.3 kg)   SpO2 98%   BMI 35.53 kg/m  Wt Readings from Last 3 Encounters:  06/21/16 247 lb 9.6 oz (112.3 kg)  05/04/16 253 lb 6.4 oz (114.9 kg)  03/21/16 255 lb 3.2 oz (115.8 kg)     Lab Results  Component Value Date   WBC 8.8 03/03/2016   HGB 14.5 03/03/2016   HCT 40.0 03/03/2016   PLT 196 03/03/2016   GLUCOSE 148 (H) 03/21/2016   CHOL 119 03/21/2016   TRIG 53.0 03/21/2016   HDL 33.20 (L) 03/21/2016   LDLCALC 75 03/21/2016   ALT 25 03/21/2016   AST 21 03/21/2016   NA 138 03/21/2016   K 3.8 03/21/2016   CL 101 03/21/2016   CREATININE 0.89 03/21/2016   BUN 18 03/21/2016   CO2 32 03/21/2016   TSH 1.69 09/23/2010   PSA 0.32 09/23/2010   INR 1.07 02/13/2014   HGBA1C 8.1 (H) 03/21/2016   MICROALBUR 16.4 (H) 10/29/2015    Lab Results  Component Value Date   TSH 1.69 09/23/2010   Lab Results    Component Value Date   WBC 8.8 03/03/2016   HGB 14.5 03/03/2016   HCT 40.0 03/03/2016   MCV 89.9 03/03/2016   PLT 196 03/03/2016   Lab Results  Component Value Date   NA 138 03/21/2016   K 3.8 03/21/2016   CHLORIDE 102 03/03/2016   CO2 32 03/21/2016   GLUCOSE 148 (H) 03/21/2016   BUN 18  03/21/2016   CREATININE 0.89 03/21/2016   BILITOT 0.8 03/21/2016   ALKPHOS 61 03/21/2016   AST 21 03/21/2016   ALT 25 03/21/2016   PROT 7.0 03/21/2016   ALBUMIN 4.3 03/21/2016   CALCIUM 10.0 03/21/2016   ANIONGAP 8 03/03/2016   EGFR 88 (L) 03/03/2016   GFR 91.50 03/21/2016   Lab Results  Component Value Date   CHOL 119 03/21/2016   Lab Results  Component Value Date   HDL 33.20 (L) 03/21/2016   Lab Results  Component Value Date   LDLCALC 75 03/21/2016   Lab Results  Component Value Date   TRIG 53.0 03/21/2016   Lab Results  Component Value Date   CHOLHDL 4 03/21/2016   Lab Results  Component Value Date   HGBA1C 8.1 (H) 03/21/2016       Assessment & Plan:   Problem List Items Addressed This Visit      Unprioritized   Hyperlipidemia LDL goal <70   Relevant Orders   Lipid panel   Hemoglobin A1c   Comprehensive metabolic panel    Other Visit Diagnoses    Uncontrolled diabetes mellitus type 2 without complications, unspecified long term insulin use status (HCC)    -  Primary   Relevant Orders   Lipid panel   Hemoglobin A1c   Comprehensive metabolic panel   Secondary hypertension       Relevant Orders   Lipid panel   Hemoglobin A1c   Comprehensive metabolic panel   Need for hepatitis C screening test       Relevant Orders   Hepatitis C antibody      I have discontinued Mr. Bryner's doxycycline. I am also having him maintain his aspirin, fish oil-omega-3 fatty acids, ONETOUCH VERIO SYNC SYSTEM, glucose blood, onetouch ultrasoft, celecoxib, valsartan-hydrochlorothiazide, sitaGLIPtin, metoprolol succinate, and dapagliflozin propanediol.  No orders of the  defined types were placed in this encounter.   CMA served as Education administrator during this visit. History, Physical and Plan performed by medical provider. Documentation and orders reviewed and attested to.   Ann Held, DO

## 2016-06-21 NOTE — Progress Notes (Signed)
Pre visit review using our clinic review tool, if applicable. No additional management support is needed unless otherwise documented below in the visit note. 

## 2016-06-21 NOTE — Patient Instructions (Signed)
Carbohydrate Counting for Diabetes Mellitus, Adult Carbohydrate counting is a method for keeping track of how many carbohydrates you eat. Eating carbohydrates naturally increases the amount of sugar (glucose) in the blood. Counting how many carbohydrates you eat helps keep your blood glucose within normal limits, which helps you manage your diabetes (diabetes mellitus). It is important to know how many carbohydrates you can safely have in each meal. This is different for every person. A diet and nutrition specialist (registered dietitian) can help you make a meal plan and calculate how many carbohydrates you should have at each meal and snack. Carbohydrates are found in the following foods:  Grains, such as breads and cereals.  Dried beans and soy products.  Starchy vegetables, such as potatoes, peas, and corn.  Fruit and fruit juices.  Milk and yogurt.  Sweets and snack foods, such as cake, cookies, candy, chips, and soft drinks. How do I count carbohydrates? There are two ways to count carbohydrates in food. You can use either of the methods or a combination of both. Reading "Nutrition Facts" on packaged food  The "Nutrition Facts" list is included on the labels of almost all packaged foods and beverages in the [place] It includes:  The serving size.  Information about nutrients in each serving, including the grams (g) of carbohydrate per serving. To use the "Nutrition Facts":  Decide how many servings you will have.  Multiply the number of servings by the number of carbohydrates per serving.  The resulting number is the total amount of carbohydrates that you will be having. Learning standard serving sizes of other foods  When you eat foods containing carbohydrates that are not packaged or do not include "Nutrition Facts" on the label, you need to measure the servings in order to count the amount of carbohydrates:  Measure the foods that you will eat with a food scale or measuring  cup, if needed.  Decide how many standard-size servings you will eat.  Multiply the number of servings by 15. Most carbohydrate-rich foods have about 15 g of carbohydrates per serving.  For example, if you eat 8 oz (170 g) of strawberries, you will have eaten 2 servings and 30 g of carbohydrates (2 servings x 15 g = 30 g).  For foods that have more than one food mixed, such as soups and casseroles, you must count the carbohydrates in each food that is included. The following list contains standard serving sizes of common carbohydrate-rich foods. Each of these servings has about 15 g of carbohydrates:   hamburger bun or  English muffin.   oz (15 mL) syrup.   oz (14 g) jelly.  1 slice of bread.  1 six-inch tortilla.  3 oz (85 g) cooked rice or pasta.  4 oz (113 g) cooked dried beans.  4 oz (113 g) starchy vegetable, such as peas, corn, or potatoes.  4 oz (113 g) hot cereal.  4 oz (113 g) mashed potatoes or  of a large baked potato.  4 oz (113 g) canned or frozen fruit.  4 oz (120 mL) fruit juice.  4-6 crackers.  6 chicken nuggets.  6 oz (170 g) unsweetened dry cereal.  6 oz (170 g) plain fat-free yogurt or yogurt sweetened with artificial sweeteners.  8 oz (240 mL) milk.  8 oz (170 g) fresh fruit or one small piece of fruit.  24 oz (680 g) popped popcorn. Example of carbohydrate counting Sample meal  3 oz (85 g) chicken breast.  6 oz (  170 g) brown rice.  4 oz (113 g) corn.  8 oz (240 mL) milk.  8 oz (170 g) strawberries with sugar-free whipped topping. Carbohydrate calculation 1. Identify the foods that contain carbohydrates:  Rice.  Corn.  Milk.  Strawberries. 2. Calculate how many servings you have of each food:  2 servings rice.  1 serving corn.  1 serving milk.  1 serving strawberries. 3. Multiply each number of servings by 15 g:  2 servings rice x 15 g = 30 g.  1 serving corn x 15 g = 15 g.  1 serving milk x 15 g = 15  g.  1 serving strawberries x 15 g = 15 g. 4. Add together all of the amounts to find the total grams of carbohydrates eaten:  30 g + 15 g + 15 g + 15 g = 75 g of carbohydrates total. This information is not intended to replace advice given to you by your health care provider. Make sure you discuss any questions you have with your health care provider. Document Released: 05/16/2005 Document Revised: 12/04/2015 Document Reviewed: 10/28/2015 Elsevier Interactive Patient Education  2017 Elsevier Inc.  

## 2016-07-23 ENCOUNTER — Other Ambulatory Visit: Payer: Self-pay | Admitting: Internal Medicine

## 2016-07-28 ENCOUNTER — Other Ambulatory Visit: Payer: Self-pay | Admitting: Internal Medicine

## 2016-07-28 NOTE — Telephone Encounter (Signed)
Order Providers   Prescribing Provider Encounter Provider  Evans Lance, MD Evans Lance, MD  Medication Detail    Disp Refills Start End   valsartan-hydrochlorothiazide (DIOVAN-HCT) 320-12.5 MG tablet 30 tablet 0 07/25/2016    Sig: TAKE 1 TABLET BY MOUTH EVERY DAY   Notes to Pharmacy: Please call our office to schedule an overdue yearly appointment before anymore refills. [phone]. Thank you 1st attempt   E-Prescribing Status: Receipt confirmed by pharmacy (07/25/2016 12:49 PM EST)   Pharmacy   [id number] - [place], [place].

## 2016-08-26 ENCOUNTER — Other Ambulatory Visit: Payer: Self-pay | Admitting: Family Medicine

## 2016-08-26 ENCOUNTER — Other Ambulatory Visit: Payer: Self-pay | Admitting: Internal Medicine

## 2016-09-02 ENCOUNTER — Other Ambulatory Visit: Payer: Self-pay | Admitting: Family Medicine

## 2016-09-02 ENCOUNTER — Other Ambulatory Visit (HOSPITAL_BASED_OUTPATIENT_CLINIC_OR_DEPARTMENT_OTHER): Payer: BLUE CROSS/BLUE SHIELD

## 2016-09-02 ENCOUNTER — Ambulatory Visit (HOSPITAL_BASED_OUTPATIENT_CLINIC_OR_DEPARTMENT_OTHER): Payer: BLUE CROSS/BLUE SHIELD | Admitting: Oncology

## 2016-09-02 ENCOUNTER — Telehealth: Payer: Self-pay | Admitting: Oncology

## 2016-09-02 VITALS — BP 115/66 | HR 99 | Temp 99.2°F | Resp 18 | Ht 70.0 in | Wt 244.1 lb

## 2016-09-02 DIAGNOSIS — I4891 Unspecified atrial fibrillation: Secondary | ICD-10-CM | POA: Diagnosis not present

## 2016-09-02 DIAGNOSIS — C8280 Other types of follicular lymphoma, unspecified site: Secondary | ICD-10-CM

## 2016-09-02 LAB — CBC WITH DIFFERENTIAL/PLATELET
BASO%: 0.5 % (ref 0.0–2.0)
Basophils Absolute: 0.1 10*3/uL (ref 0.0–0.1)
EOS ABS: 0.3 10*3/uL (ref 0.0–0.5)
EOS%: 2.7 % (ref 0.0–7.0)
HCT: 46.4 % (ref 38.4–49.9)
HEMOGLOBIN: 15.8 g/dL (ref 13.0–17.1)
LYMPH#: 1.3 10*3/uL (ref 0.9–3.3)
LYMPH%: 10 % — ABNORMAL LOW (ref 14.0–49.0)
MCH: 31.8 pg (ref 27.2–33.4)
MCHC: 34.1 g/dL (ref 32.0–36.0)
MCV: 93.3 fL (ref 79.3–98.0)
MONO#: 0.9 10*3/uL (ref 0.1–0.9)
MONO%: 7.4 % (ref 0.0–14.0)
NEUT%: 79.4 % — ABNORMAL HIGH (ref 39.0–75.0)
NEUTROS ABS: 10 10*3/uL — AB (ref 1.5–6.5)
Platelets: 185 10*3/uL (ref 140–400)
RBC: 4.97 10*6/uL (ref 4.20–5.82)
RDW: 14 % (ref 11.0–14.6)
WBC: 12.5 10*3/uL — AB (ref 4.0–10.3)

## 2016-09-02 LAB — COMPREHENSIVE METABOLIC PANEL
ALBUMIN: 4.1 g/dL (ref 3.5–5.0)
ALK PHOS: 75 U/L (ref 40–150)
ALT: 24 U/L (ref 0–55)
AST: 18 U/L (ref 5–34)
Anion Gap: 11 mEq/L (ref 3–11)
BILIRUBIN TOTAL: 1.4 mg/dL — AB (ref 0.20–1.20)
BUN: 17.6 mg/dL (ref 7.0–26.0)
CALCIUM: 10 mg/dL (ref 8.4–10.4)
CO2: 26 mEq/L (ref 22–29)
Chloride: 101 mEq/L (ref 98–109)
Creatinine: 1.2 mg/dL (ref 0.7–1.3)
EGFR: 67 mL/min/{1.73_m2} — AB (ref >90)
Glucose: 236 mg/dl — ABNORMAL HIGH (ref 70–140)
Potassium: 3.9 mEq/L (ref 3.5–5.1)
Sodium: 138 mEq/L (ref 136–145)
TOTAL PROTEIN: 6.8 g/dL (ref 6.4–8.3)

## 2016-09-02 LAB — LACTATE DEHYDROGENASE: LDH: 152 U/L (ref 125–245)

## 2016-09-02 NOTE — Telephone Encounter (Signed)
Gave patient AVS and calender per 4/6 los,.

## 2016-09-02 NOTE — Progress Notes (Signed)
Hematology and Oncology Follow Up Visit  Tom Fuller [id number] 22-Jan-1951 66 y.o. 09/02/2016 3:39 PM    CC: [name] [name], MD  [name], [name]  [name], [name]: 66 year old gentleman with stage IIIA low-grade follicular lymphoma diagnosed May 2010.  Past Therapy:  He is S/P Bendamustine and rituximab started on 08/25/11 for 6 cycles completed in 12/2011.  Current therapy: Observation and surveillance.  Interim History:  Tom Fuller presents today for a followup visit. Since his last visit, he reports no complaints. He remains active and continues to work full time. He denied any excessive fatigue or decline in his performance status. He denied any recent hospitalization or illnesses.  He denied any fevers, chills or constitutional symptoms. He denied any painful adenopathy. He does not report any abdominal pain or early satiety. He denied any weight loss or changes in his mentation. His blood sugar has been manageable at this time.  He denies any headaches, blurry vision or syncope. He does not report any fevers, chills, sweats. He did not report any chest pain, shortness of breath or cough. He does not report any cough, wheezing or hemoptysis. He does not report any nausea, change in his bowel habits. He does not report any frequency urgency or hematuria. The remaining review of systems unremarkable.  Medications: I have reviewed the patient's current medications.  Current Outpatient Prescriptions  Medication Sig Dispense Refill  . aspirin 325 MG EC tablet Take 325 mg by mouth daily.      . Blood Glucose Monitoring Suppl ([place]) w/Device KIT by Does not apply route.    . celecoxib (CELEBREX) 100 MG capsule Take 1 capsule (100 mg total) by mouth daily. 90 capsule 3  . FARXIGA 5 MG TABS tablet TAKE 1 TABLET BY MOUTH EVERY DAY 90 tablet 1  . fish oil-omega-3 fatty acids 1000 MG capsule Take 2 g by mouth 2 (two) times daily. Reported on  06/09/2015    . glucose blood test strip Check blood sugar two times daily. 200 each 3  . Lancets (ONETOUCH ULTRASOFT) lancets Use as instructed 100 each 12  . metoprolol succinate (TOPROL-XL) 50 MG 24 hr tablet Take 1 tablet by mouth bid 180 tablet 3  . sitaGLIPtin (JANUVIA) 100 MG tablet Take 1 tablet (100 mg total) by mouth daily. 90 tablet 1  . valsartan-hydrochlorothiazide (DIOVAN-HCT) 320-12.5 MG tablet TAKE 1 TABLET BY MOUTH EVERY DAY 15 tablet 0   No current facility-administered medications for this visit.     Allergies:  Allergies  Allergen Reactions  . Ampicillin Rash    Past Medical History, Surgical history, Social history, and Family History were reviewed and updated.    Physical Exam: Blood pressure 115/66, pulse 99, temperature 99.2 F (37.3 C), temperature source Oral, resp. rate 18, height '5\' 10"'$  (1.778 m), weight 244 lb 1.6 oz (110.7 kg), SpO2 97 %. ECOG: 0 General appearance: Alert, awake gentleman without distress. Head: Normocephalic, without obvious abnormality no oral thrush or ulcers. Neck: no adenopathy or thyromegaly. Lymph nodes: No cervical, inguinal or axillary adenopathy. Heart: Irregular without murmurs. Lung:chest clear, no wheezing, rales, normal symmetric air entry,  Abdomen: soft, non-tender, without masses or organomegaly no shifting dullness or ascites. Skin: No ecchymosis or petechiae. EXT:no erythema, induration, or nodules  Lab Results: Lab Results  Component Value Date   WBC 12.5 (H) 09/02/2016   HGB 15.8 09/02/2016   HCT 46.4 09/02/2016   MCV 93.3 09/02/2016  PLT 185 09/02/2016       Impression and Plan: 66 year old gentleman with the following issues: 1. Stage III follicular lymphoma. He is S/P 6 cycles of chemotherapy concluded in March 2013 and achieved complete response. CT scans on 02/11/2015 continue to confirm CR without any evidence of relapse disease.  His laboratory data and physical examination do not suggest any  evidence of recurrent disease. The plan is to continue with active surveillance and repeat imaging studies he develops any symptoms. 2. Atrial fibrillation. Follows up with cardiology. His rate is controlled.  3. Age-appropriate cancer screening: He appears to be up to date. 4. Follow-up: Will be in 6 months.   [id number] 4/6/20183:39 PM

## 2016-09-03 MED ORDER — DAPAGLIFLOZIN PROPANEDIOL 5 MG PO TABS: 5.0000 mg | ORAL_TABLET | Freq: Every day | ORAL | 1 refills | Status: DC

## 2016-09-09 ENCOUNTER — Telehealth: Payer: Self-pay | Admitting: *Deleted

## 2016-09-09 NOTE — Telephone Encounter (Signed)
Received fax from [facility] about shingles vaccine.  It is covered by insurance.  Spoke with wife.  She stated that they will call back to make an appointment.

## 2016-09-13 ENCOUNTER — Ambulatory Visit (INDEPENDENT_AMBULATORY_CARE_PROVIDER_SITE_OTHER): Payer: BLUE CROSS/BLUE SHIELD | Admitting: Internal Medicine

## 2016-09-13 ENCOUNTER — Encounter: Payer: Self-pay | Admitting: Internal Medicine

## 2016-09-13 VITALS — BP 112/84 | HR 66 | Ht 70.0 in | Wt 243.2 lb

## 2016-09-13 DIAGNOSIS — I480 Paroxysmal atrial fibrillation: Secondary | ICD-10-CM

## 2016-09-13 MED ORDER — METOPROLOL SUCCINATE ER 50 MG PO TB24: 50.0000 mg | ORAL_TABLET | Freq: Every day | ORAL | 3 refills | Status: DC

## 2016-09-13 MED ORDER — VALSARTAN-HYDROCHLOROTHIAZIDE 320-12.5 MG PO TABS: 1.0000 | ORAL_TABLET | Freq: Every day | ORAL | 3 refills | Status: DC

## 2016-09-13 NOTE — Patient Instructions (Addendum)
Medication Instructions:  Your physician has recommended you make the following change in your medication:  1) Decrease Metoprolol to 50 mg ONCE daily    Labwork: None ordered   Testing/Procedures: None ordered   Follow-Up: Your physician wants you to follow-up in: 1 year with Dr. [name]. You will receive a reminder letter in the mail two months in advance. If you don't receive a letter, please call our office to schedule the follow-up appointment.    Any Other Special Instructions Will Be Listed Below (If Applicable).     If you need a refill on your cardiac medications before your next appointment, please call your pharmacy.

## 2016-09-13 NOTE — Progress Notes (Signed)
HPI Mr. Custer returns for followup. He is a pleasant 66 yo man with morbid obesity, chronic atrial fibrillation, hypertension, who returns today for follow-up. In the interim, he has done well except for difficulty losing weight. He denies chest pain or shortness of breath. He has  mild peripheral edema. He is trying to avoid sodium indiscretion. He was diagnosed with lymphoma, had it treated, and now appears to be in remission. He remains working a regular job. He has not had syncope. He has felt weak and a lack of energy.  Allergies  Allergen Reactions  . Ampicillin Rash     Current Outpatient Prescriptions  Medication Sig Dispense Refill  . aspirin 325 MG EC tablet Take 325 mg by mouth daily.      . Blood Glucose Monitoring Suppl ([place]) w/Device KIT by Does not apply route.    . celecoxib (CELEBREX) 100 MG capsule Take 1 capsule (100 mg total) by mouth daily. 90 capsule 3  . dapagliflozin propanediol (FARXIGA) 5 MG TABS tablet Take 5 mg by mouth daily. 90 tablet 1  . glucose blood test strip Check blood sugar two times daily. 200 each 3  . Lancets (ONETOUCH ULTRASOFT) lancets Use as instructed 100 each 12  . metoprolol succinate (TOPROL-XL) 50 MG 24 hr tablet Take 50 mg by mouth 2 (two) times daily. Take with or immediately following a meal.    . sitaGLIPtin (JANUVIA) 100 MG tablet Take 1 tablet (100 mg total) by mouth daily. 90 tablet 1  . valsartan-hydrochlorothiazide (DIOVAN-HCT) 320-12.5 MG tablet TAKE 1 TABLET BY MOUTH EVERY DAY 15 tablet 0   No current facility-administered medications for this visit.      Past Medical History:  Diagnosis Date  . Atrial fibrillation ([place])   . Cervical lymphadenopathy   . Erectile dysfunction   . GERD (gastroesophageal reflux disease)   . Hypertension   . Lymphoma, follicular ([place])    Dr.Shadad    ROS:   All systems reviewed and negative except as noted in the HPI.   Past Surgical History:  Procedure  Laterality Date  . a bsc    . ABSCESS DRAINAGE     under rt eye   . DEEP NECK LYMPH NODE BIOPSY / EXCISION  2011  . VASECTOMY       Family History  Problem Relation Age of Onset  . Hypertension Mother   . Kidney disease Mother   . Hypertension Father   . Hypertension Brother   . Diabetes Paternal Grandfather   . Hypertension Paternal Grandfather   . Kidney disease Maternal Aunt   . Kidney disease Maternal Grandmother      Social History   Social History  . Marital status: Married    Spouse name: N/A  . Number of children: N/A  . Years of education: N/A   Occupational History  . Not on file.   Social History Main Topics  . Smoking status: Never Smoker  . Smokeless tobacco: Never Used  . Alcohol use No  . Drug use: No  . Sexual activity: Yes    Partners: Female   Other Topics Concern  . Not on file   Social History Narrative  . No narrative on file     BP 112/84   Pulse 66   Ht '5\' 10"'$  (1.778 m)   Wt 243 lb 3.2 oz (110.3 kg)   SpO2 98%   BMI 34.90 kg/m   Physical Exam:  Well  appearing 66 yo man, NAD HEENT: Unremarkable Neck:  7 cm JVD, no thyromegally Back:  No CVA tenderness Lungs:  Clear with no wheezes, rales, or rhonchi  HEART:  IRegular rate rhythm, no murmurs, no rubs, no clicks Abd:  soft, positive bowel sounds, no organomegally, no rebound, no guarding Ext:  2 plus pulses, no edema, no cyanosis, no clubbing Skin:  No rashes no nodules Neuro:  CN II through XII intact, motor grossly intact  Assess/Plan: 1. Atrial fib - his chronic atrial fib is well controlled. I have asked him to reduce his dose of AV nodal blocking drug as his rates are well controlled and BP a little low. 2. HTN heart disease - his blood pressure is low and he will reduce his dose of toprol. I have asked him to lose weight. 3. Lymphoma - he is in remission and undergoing surveillance.  4. Peripheral edema - he has chronic edema and I have encouraged the patient to keep  his legs elevated and to maintain a low sodium diet.  Mikle Bosworth.D.

## 2016-09-26 ENCOUNTER — Other Ambulatory Visit: Payer: Self-pay | Admitting: Family Medicine

## 2016-09-26 DIAGNOSIS — IMO0002 Reserved for concepts with insufficient information to code with codable children: Secondary | ICD-10-CM

## 2016-09-26 DIAGNOSIS — E1165 Type 2 diabetes mellitus with hyperglycemia: Secondary | ICD-10-CM

## 2016-09-26 DIAGNOSIS — E118 Type 2 diabetes mellitus with unspecified complications: Principal | ICD-10-CM

## 2016-09-29 ENCOUNTER — Other Ambulatory Visit: Payer: Self-pay | Admitting: Family Medicine

## 2016-09-29 DIAGNOSIS — E1165 Type 2 diabetes mellitus with hyperglycemia: Secondary | ICD-10-CM

## 2016-09-29 DIAGNOSIS — IMO0002 Reserved for concepts with insufficient information to code with codable children: Secondary | ICD-10-CM

## 2016-09-29 DIAGNOSIS — E118 Type 2 diabetes mellitus with unspecified complications: Principal | ICD-10-CM

## 2017-03-10 ENCOUNTER — Telehealth: Payer: Self-pay | Admitting: Oncology

## 2017-03-10 ENCOUNTER — Other Ambulatory Visit (HOSPITAL_BASED_OUTPATIENT_CLINIC_OR_DEPARTMENT_OTHER): Payer: BLUE CROSS/BLUE SHIELD

## 2017-03-10 ENCOUNTER — Ambulatory Visit: Payer: BLUE CROSS/BLUE SHIELD | Admitting: Oncology

## 2017-03-10 VITALS — BP 122/69 | HR 75 | Temp 99.0°F | Resp 18 | Ht 70.0 in | Wt 242.8 lb

## 2017-03-10 DIAGNOSIS — C8280 Other types of follicular lymphoma, unspecified site: Secondary | ICD-10-CM

## 2017-03-10 LAB — CBC WITH DIFFERENTIAL/PLATELET
BASO%: 1.3 % (ref 0.0–2.0)
BASOS ABS: 0.1 10*3/uL (ref 0.0–0.1)
EOS%: 5.8 % (ref 0.0–7.0)
Eosinophils Absolute: 0.5 10*3/uL (ref 0.0–0.5)
HCT: 44.6 % (ref 38.4–49.9)
HGB: 15.4 g/dL (ref 13.0–17.1)
LYMPH%: 24.1 % (ref 14.0–49.0)
MCH: 32.7 pg (ref 27.2–33.4)
MCHC: 34.6 g/dL (ref 32.0–36.0)
MCV: 94.7 fL (ref 79.3–98.0)
MONO#: 1 10*3/uL — ABNORMAL HIGH (ref 0.1–0.9)
MONO%: 10.6 % (ref 0.0–14.0)
NEUT#: 5.3 10*3/uL (ref 1.5–6.5)
NEUT%: 58.2 % (ref 39.0–75.0)
Platelets: 203 10*3/uL (ref 140–400)
RBC: 4.72 10*6/uL (ref 4.20–5.82)
RDW: 13.6 % (ref 11.0–14.6)
WBC: 9 10*3/uL (ref 4.0–10.3)
lymph#: 2.2 10*3/uL (ref 0.9–3.3)

## 2017-03-10 LAB — COMPREHENSIVE METABOLIC PANEL
ALT: 29 U/L (ref 0–55)
ANION GAP: 9 meq/L (ref 3–11)
AST: 22 U/L (ref 5–34)
Albumin: 4 g/dL (ref 3.5–5.0)
Alkaline Phosphatase: 68 U/L (ref 40–150)
BILIRUBIN TOTAL: 1.03 mg/dL (ref 0.20–1.20)
BUN: 25.4 mg/dL (ref 7.0–26.0)
CALCIUM: 9.6 mg/dL (ref 8.4–10.4)
CHLORIDE: 100 meq/L (ref 98–109)
CO2: 27 meq/L (ref 22–29)
Creatinine: 1.2 mg/dL (ref 0.7–1.3)
EGFR: >60 mL/min/{1.73_m2} (ref >60)
Glucose: 239 mg/dl — ABNORMAL HIGH (ref 70–140)
Potassium: 3.7 mEq/L (ref 3.5–5.1)
Sodium: 137 mEq/L (ref 136–145)
Total Protein: 6.8 g/dL (ref 6.4–8.3)

## 2017-03-10 NOTE — Progress Notes (Signed)
Hematology and Oncology Follow Up Visit  Tom Fuller [id number] 15-Dec-1950 66 y.o. 03/10/2017 3:39 PM    CC: [name] [name], MD  [name], [name]  [name], [name]: 66 year old gentleman with stage IIIA low-grade follicular lymphoma diagnosed May 2010.  Past Therapy:  He is S/P Bendamustine and rituximab started on 08/25/11 for 6 cycles completed in 12/2011.  Current therapy: Observation and surveillance.  Interim History:  [name] presents today for a followup visit. Since his last visit, he reports no changes in his health. He denied any recent hospitalization or illnesses or cardiac complications. He continues to have atrial fibrillation without recent issues. He remains active and continues to work full time. He denied any excessive fatigue or decline in his performance status.  He denied any fevers, chills or constitutional symptoms. He denied any painful adenopathy. He does not report any abdominal pain or early satiety. He denied any weight loss or changes in his mentation. His energy and quality of life remains excellent.Marland Kitchen  He denies any headaches, blurry vision or syncope. He does not report any fevers, chills, sweats. He did not report any chest pain, shortness of breath or cough. He does not report any cough, wheezing or hemoptysis. He does not report any nausea, change in his bowel habits. He does not report any frequency urgency or hematuria. The remaining review of systems unremarkable.  Medications: I have reviewed the patient's current medications.  Current Outpatient Prescriptions  Medication Sig Dispense Refill  . valsartan-hydrochlorothiazide (DIOVAN-HCT) 320-12.5 MG tablet Take 1 tablet by mouth daily. 90 tablet 3  . metoprolol succinate (TOPROL-XL) 50 MG 24 hr tablet Take 1 tablet (50 mg total) by mouth daily. Take with or immediately following a meal. 90 tablet 3   No current facility-administered medications for this visit.      Allergies:  Allergies  Allergen Reactions  . Ampicillin Rash    Past Medical History, Surgical history, Social history, and Family History were reviewed and updated.    Physical Exam: Blood pressure 122/69, pulse 75, temperature 99 F (37.2 C), temperature source Oral, resp. rate 18, height 5\' 10"  (1.778 m), weight 242 lb 12.8 oz (110.1 kg), SpO2 99 %. ECOG: 0 General appearance: Well-appearing gentleman without distress. Head: Normocephalic, without obvious abnormality no oral ulcers or thrush. Neck: no adenopathy or thyromegaly. Lymph nodes: No cervical, inguinal or axillary adenopathy. Heart: Irregular without murmurs. No murmurs or gallops. Lung:chest clear, no wheezing, rales, normal symmetric air entry,  Abdomen: soft, non-tender, without masses or organomegaly no rebound or guarding. Skin: No ecchymosis or petechiae. EXT:no erythema, induration, or nodules  Lab Results: Lab Results  Component Value Date   WBC 9.0 03/10/2017   HGB 15.4 03/10/2017   HCT 44.6 03/10/2017   MCV 94.7 03/10/2017   PLT 203 03/10/2017       Impression and Plan:  66 year old gentleman with the following issues:  1. Stage III follicular lymphoma. He is S/P 6 cycles of chemotherapy concluded in March 2013 and achieved complete response. CT scans on 02/11/2015 Confirmed CR without any evidence of relapse disease.  His laboratory data and physical examination on 03/10/2017 do not suggest any evidence of recurrent disease. The plan is to continue with active surveillance and repeat imaging studies as needed. 2. Atrial fibrillation. Follows up with cardiology. His rate is controlled.  3. Age-appropriate cancer screening: He appears to be up to date. 4. Follow-up: Will be in 6 months.   Roxy Cedar  Shadad 10/12/20183:39 PM

## 2017-03-10 NOTE — Telephone Encounter (Signed)
Scheduled appt per 10/12 los - Gave patient AVS and calender per los.

## 2017-04-24 ENCOUNTER — Telehealth: Payer: Self-pay

## 2017-04-24 NOTE — Telephone Encounter (Signed)
Alternative Rx request for Atenolol 25 mg for out of pocket cost. Please advise  Last OV: 09/13/16

## 2017-06-06 ENCOUNTER — Other Ambulatory Visit: Payer: Self-pay | Admitting: *Deleted

## 2017-06-06 MED ORDER — ACCU-CHEK FASTCLIX LANCETS MISC: 1 refills | Status: DC

## 2017-06-06 MED ORDER — GLUCOSE BLOOD VI STRP: ORAL_STRIP | 1 refills | Status: DC

## 2017-06-06 NOTE — Telephone Encounter (Signed)
Patient got new machine and wanted test strips and lancets sent in.

## 2017-07-01 ENCOUNTER — Emergency Department (HOSPITAL_COMMUNITY)
Admission: EM | Admit: 2017-07-01 | Discharge: 2017-07-02 | Discharge status: Against Medical Advice | Payer: BLUE CROSS/BLUE SHIELD

## 2017-07-05 ENCOUNTER — Other Ambulatory Visit: Payer: Self-pay | Admitting: *Deleted

## 2017-07-05 MED ORDER — SITAGLIPTIN PHOSPHATE 100 MG PO TABS: 100.0000 mg | ORAL_TABLET | Freq: Every day | ORAL | 1 refills | Status: DC

## 2017-07-28 ENCOUNTER — Telehealth: Payer: Self-pay

## 2017-07-28 DIAGNOSIS — I10 Essential (primary) hypertension: Secondary | ICD-10-CM

## 2017-07-28 MED ORDER — METOPROLOL TARTRATE 25 MG PO TABS: 25.0000 mg | ORAL_TABLET | Freq: Two times a day (BID) | ORAL | 3 refills | Status: DC

## 2017-07-28 NOTE — Telephone Encounter (Signed)
Left detailed message-ok to switch from Toprol XL to metoprolol tartrate 25 mg bid.  Sent to [facility].  No further action needed.

## 2017-08-24 ENCOUNTER — Other Ambulatory Visit: Payer: Self-pay | Admitting: Family Medicine

## 2017-08-25 ENCOUNTER — Other Ambulatory Visit: Payer: Self-pay | Admitting: Family Medicine

## 2017-08-25 NOTE — Telephone Encounter (Signed)
Request denied.  Patient no longer taking this prescription.//AB/CMA

## 2017-09-01 ENCOUNTER — Telehealth: Payer: Self-pay | Admitting: Oncology

## 2017-09-01 NOTE — Telephone Encounter (Signed)
Call day - moved 4/19 lab/fu to 5. Spoke with patient wife she is aware.

## 2017-09-05 ENCOUNTER — Other Ambulatory Visit: Payer: Self-pay | Admitting: *Deleted

## 2017-09-05 MED ORDER — DAPAGLIFLOZIN PROPANEDIOL 5 MG PO TABS: 5.0000 mg | ORAL_TABLET | Freq: Every day | ORAL | 0 refills | Status: DC

## 2017-09-08 ENCOUNTER — Other Ambulatory Visit: Payer: Self-pay | Admitting: Internal Medicine

## 2017-09-15 ENCOUNTER — Ambulatory Visit: Payer: BLUE CROSS/BLUE SHIELD | Admitting: Oncology

## 2017-09-15 ENCOUNTER — Other Ambulatory Visit: Payer: BLUE CROSS/BLUE SHIELD

## 2017-09-19 ENCOUNTER — Encounter: Payer: Self-pay | Admitting: Family Medicine

## 2017-09-19 ENCOUNTER — Ambulatory Visit: Payer: BLUE CROSS/BLUE SHIELD | Admitting: Family Medicine

## 2017-09-19 ENCOUNTER — Ambulatory Visit (HOSPITAL_COMMUNITY)
Admission: EM | Admit: 2017-09-19 | Discharge: 2017-09-19 | Disposition: A | Discharge status: Home / Self Care | Payer: BLUE CROSS/BLUE SHIELD | Attending: Nurse Practitioner | Admitting: Nurse Practitioner

## 2017-09-19 ENCOUNTER — Encounter (HOSPITAL_COMMUNITY): Payer: Self-pay | Admitting: Emergency Medicine

## 2017-09-19 VITALS — BP 138/92 | HR 92 | Temp 98.5°F | Resp 16 | Ht 70.0 in | Wt 243.0 lb

## 2017-09-19 DIAGNOSIS — D2339 Other benign neoplasm of skin of other parts of face: Secondary | ICD-10-CM | POA: Diagnosis not present

## 2017-09-19 DIAGNOSIS — E1139 Type 2 diabetes mellitus with other diabetic ophthalmic complication: Secondary | ICD-10-CM

## 2017-09-19 DIAGNOSIS — L918 Other hypertrophic disorders of the skin: Secondary | ICD-10-CM

## 2017-09-19 DIAGNOSIS — S0180XA Unspecified open wound of other part of head, initial encounter: Secondary | ICD-10-CM | POA: Diagnosis not present

## 2017-09-19 DIAGNOSIS — E1165 Type 2 diabetes mellitus with hyperglycemia: Secondary | ICD-10-CM | POA: Diagnosis not present

## 2017-09-19 MED ORDER — LIDOCAINE-EPINEPHRINE (PF) 2 %-1:200000 IJ SOLN
INTRAMUSCULAR | Status: AC
  Filled 2017-09-19: qty 20

## 2017-09-19 NOTE — Patient Instructions (Signed)
Keep area dry for 24 hours then you may shower as usual

## 2017-09-19 NOTE — ED Triage Notes (Signed)
Pt sts had skin tag removed today with bleeding; pt with bleeding controlled with dressing at present

## 2017-09-19 NOTE — ED Provider Notes (Signed)
[facility]    CSN: [id number] Arrival date & time: 09/19/17  1942     History   Chief Complaint Chief Complaint  Patient presents with  . Wound Check    HPI Tom Fuller is a 67 y.o. male.    Subjective:  Tom Fuller is a 67 y.o. male who presents for evaluation of bleeding wound chin. Patient underwent a skin tag removal earlier today by his PCP. Later this evening, the wound started to bleed and he was unable to stop the bleeding. He takes aspirin 325 mg daily but no anticoagulants. Patient denies any pain.   The following portions of the patient's history were reviewed and updated as appropriate: allergies, current medications, past family history, past medical history, past social history, past surgical history and problem list.        Past Medical History:  Diagnosis Date  . Atrial fibrillation ([place])   . Cervical lymphadenopathy   . Erectile dysfunction   . GERD (gastroesophageal reflux disease)   . Hypertension   . Lymphoma, follicular ([place])    Dr.Shadad    Patient Active Problem List   Diagnosis Date Noted  . Uncontrolled type 2 diabetes mellitus with complication, without long-term current use of insulin ([place]) 03/26/2016  . Hyperlipidemia LDL goal <70 03/26/2016  . Left otitis media 06/17/2015  . Morbid obesity ([place]) 04/23/2014  . Preventative health care 09/23/2010  . ATRIAL FIBRILLATION 09/09/2008  . Lymphoma ([place]) 08/07/2008  . Enlarged lymph nodes 07/07/2008  . GERD 07/25/2007  . ERECTILE DYSFUNCTION 03/15/2007  . Essential hypertension 02/27/2007    Past Surgical History:  Procedure Laterality Date  . a bsc    . ABSCESS DRAINAGE     under rt eye   . DEEP NECK LYMPH NODE BIOPSY / EXCISION  2011  . VASECTOMY         Home Medications    Prior to Admission medications   Medication Sig Start Date End Date Taking? Authorizing Provider  ACCU-CHEK FASTCLIX LANCETS MISC Use as directed once a day.  DX Code: E11.8 06/06/17    Ann Held, DO  dapagliflozin propanediol (FARXIGA) 5 MG TABS tablet Take 5 mg by mouth daily. 09/05/17   Roma Schanz R, DO  glucose blood (ACCU-CHEK GUIDE) test strip Use as directed once a day.  DX Code: E11.8 06/06/17   Ann Held, DO  metoprolol tartrate (LOPRESSOR) 25 MG tablet Take 1 tablet (25 mg total) by mouth 2 (two) times daily. 07/28/17 10/26/17  Evans Lance, MD  sitaGLIPtin (JANUVIA) 100 MG tablet Take 1 tablet (100 mg total) by mouth daily. 07/05/17   Carollee Herter, Yvonne R, DO  valsartan-hydrochlorothiazide (DIOVAN-HCT) 320-12.5 MG tablet TAKE 1 TABLET BY MOUTH DAILY. 09/08/17   Evans Lance, MD    Family History Family History  Problem Relation Age of Onset  . Hypertension Mother   . Kidney disease Mother   . Hypertension Father   . Hypertension Brother   . Diabetes Paternal Grandfather   . Hypertension Paternal Grandfather   . Kidney disease Maternal Aunt   . Kidney disease Maternal Grandmother     Social History Social History   Tobacco Use  . Smoking status: Never Smoker  . Smokeless tobacco: Never Used  Substance Use Topics  . Alcohol use: No  . Drug use: No     Allergies   Ampicillin   Review of Systems Review of Systems  Skin: Positive for wound.  All other systems reviewed and are negative.    Physical Exam Triage Vital Signs ED Triage Vitals  Enc Vitals Group     BP 09/19/17 2000 (!) 159/93     Pulse Rate 09/19/17 2000 90     Resp 09/19/17 2000 18     Temp 09/19/17 2000 98.1 F (36.7 C)     Temp Source 09/19/17 2000 Oral     SpO2 09/19/17 2000 98 %     Weight --      Height --      Head Circumference --      Peak Flow --      Pain Score 09/19/17 2001 0     Pain Loc --      Pain Edu? --      Excl. in [place]? --    No data found.  Updated Vital Signs BP (!) 159/93 (BP Location: Left Arm)   Pulse 90   Temp 98.1 F (36.7 C) (Oral)   Resp 18   SpO2 98%   Visual Acuity Right Eye Distance:   Left Eye  Distance:   Bilateral Distance:    Right Eye Near:   Left Eye Near:    Bilateral Near:     Physical Exam  Constitutional: He is oriented to person, place, and time. He appears well-developed and well-nourished.  Neck: Normal range of motion. Neck supple.  Cardiovascular: Normal rate and regular rhythm.  Pulmonary/Chest: Effort normal and breath sounds normal.  Musculoskeletal: Normal range of motion.  Neurological: He is alert and oriented to person, place, and time.  Skin: Skin is warm and dry.  There is an approximately 1 cm in diameter open wound located to the chin area actively bleeding.  Psychiatric: He has a normal mood and affect.     UC Treatments / Results  Labs (all labs ordered are listed, but only abnormal results are displayed) Labs Reviewed - No data to display  EKG None Radiology No results found.  Procedures Laceration Repair Date/Time: 09/19/2017 8:28 PM Performed by: Enrique Sack, FNP Authorized by: Enrique Sack, FNP   Consent:    Consent obtained:  Verbal   Consent given by:  Patient   Risks discussed:  Infection   Alternatives discussed:  No treatment Anesthesia (see MAR for exact dosages):    Anesthesia method:  Local infiltration   Local anesthetic:  Lidocaine 2% WITH epi Laceration details:    Location: chin  Repair type:    Repair type:  Simple Pre-procedure details:    Preparation:  Patient was prepped and draped in usual sterile fashion Exploration:    Hemostasis obtained with: Attempted hemostasis with direct pressure and cautery without success. Moved forward with sutures as below.    Contaminated: no   Treatment:    Amount of cleaning:  Standard Skin repair:    Repair method:  Sutures   Suture size:  4-0   Suture material:  Prolene   Suture technique:  Simple interrupted   Number of sutures:  2 Approximation:    Approximation:  Close Post-procedure details:    Dressing:  Antibiotic ointment   Patient tolerance of  procedure:  Tolerated well, no immediate complications   (including critical care time)  Medications Ordered in UC Medications - No data to display   Initial Impression / Assessment and Plan / UC Course  I have reviewed the triage vital signs and the nursing notes.  Pertinent labs & imaging results that were available during my care of  the patient were reviewed by me and considered in my medical decision making (see chart for details).     67 year old male presenting with bleeding wound of chin status post skin tag removal earlier today by his PCP. Attempted to achieve hemostasis with direct pressure and cautery without success. Wound was closed successfully with 2 sutures. Patient tolerated procedure well. Patient should return in 5 days for suture removal.  `Discussed diagnosis and treatment with patient. All questions have been answered and all concerns have been addressed. The patient verbalized understanding and had no further questions   Final Clinical Impressions(s) / UC Diagnoses   Final diagnoses:  Open facial wound, initial encounter    ED Discharge Orders    None       Controlled Substance Prescriptions [place] Controlled Substance Registry consulted? Not Applicable   Enrique Sack, [facility] 09/19/17 2031

## 2017-09-19 NOTE — Progress Notes (Signed)
Shave Biopsy Procedure Note  Pre-operative Diagnosis: large skin tag   Post-operative Diagnosis: same  Locations:anterior face--chin  Indications: irritated, changed  Anesthesia: xylocaine 1% with epi without added sodium bicarbonate  Procedure Details  History of allergy to iodine: no  Patient informed of the risks (including bleeding and infection) and benefits of the  procedure and Written informed consent obtained.  The lesion and surrounding area were given a sterile prep using betadyne and draped in the usual sterile fashion. A scalpel was used to shave an area of skin approximately 0.5 cmcm by 0.5 cmcm.  Hemostasis achieved with silver nitrate. Antibiotic ointment and a sterile dressing applied.  The specimen was sent for pathologic examination. The patient tolerated the procedure well.  EBL: 1 ml  Findings: #11 scalpel used   Condition: Stable  Complications: none.  Plan: 1. Instructed to keep the wound dry and covered for 24-48h and clean thereafter. 2. Warning signs of infection were reviewed.   3. Recommended that the patient use OTC acetaminophen as needed for pain.  4. Return in a few days-- if needed.

## 2017-09-20 LAB — COMPREHENSIVE METABOLIC PANEL
ALK PHOS: 68 U/L (ref 39–117)
ALT: 29 U/L (ref 0–53)
AST: 23 U/L (ref 0–37)
Albumin: 4.2 g/dL (ref 3.5–5.2)
BILIRUBIN TOTAL: 0.8 mg/dL (ref 0.2–1.2)
BUN: 15 mg/dL (ref 6–23)
CO2: 29 meq/L (ref 19–32)
Calcium: 9.5 mg/dL (ref 8.4–10.5)
Chloride: 101 mEq/L (ref 96–112)
Creatinine, Ser: 0.95 mg/dL (ref 0.40–1.50)
GFR: 84.47 mL/min (ref >60.00)
GLUCOSE: 179 mg/dL — AB (ref 70–99)
Potassium: 4 mEq/L (ref 3.5–5.1)
SODIUM: 136 meq/L (ref 135–145)
TOTAL PROTEIN: 6.8 g/dL (ref 6.0–8.3)

## 2017-09-20 LAB — LIPID PANEL
CHOLESTEROL: 124 mg/dL (ref 0–200)
HDL: 35.3 mg/dL — ABNORMAL LOW (ref >39.00)
LDL Cholesterol: 72 mg/dL (ref 0–99)
NONHDL: 89.11
Total CHOL/HDL Ratio: 4
Triglycerides: 88 mg/dL (ref 0.0–149.0)
VLDL: 17.6 mg/dL (ref 0.0–40.0)

## 2017-09-20 LAB — HEMOGLOBIN A1C: HEMOGLOBIN A1C: 10.7 % — AB (ref 4.6–6.5)

## 2017-09-22 ENCOUNTER — Other Ambulatory Visit: Payer: Self-pay | Admitting: *Deleted

## 2017-09-22 MED ORDER — VALSARTAN-HYDROCHLOROTHIAZIDE 320-12.5 MG PO TABS: 1.0000 | ORAL_TABLET | Freq: Every day | ORAL | 0 refills | Status: DC

## 2017-09-22 NOTE — Telephone Encounter (Signed)
Patient called and stated that his insurance denied the rx for valsartan-hctz as it was sent in for a sixty day supply. They are requiring a ninety day supply. Patient aware that I will send in one 4 day supply but he needs to keep the upcoming appointment. He verbalized his understanding and appreciation.

## 2017-09-26 ENCOUNTER — Telehealth: Payer: Self-pay | Admitting: *Deleted

## 2017-09-26 NOTE — Telephone Encounter (Signed)
Received Dermatopathology Report results from [facility]; forwarded to provider/SLS 04/30

## 2017-09-29 ENCOUNTER — Ambulatory Visit (HOSPITAL_COMMUNITY)
Admission: EM | Admit: 2017-09-29 | Discharge: 2017-09-29 | Disposition: A | Discharge status: Home / Self Care | Payer: BLUE CROSS/BLUE SHIELD

## 2017-09-29 DIAGNOSIS — S0180XD Unspecified open wound of other part of head, subsequent encounter: Secondary | ICD-10-CM

## 2017-09-29 DIAGNOSIS — Z4802 Encounter for removal of sutures: Secondary | ICD-10-CM

## 2017-09-29 NOTE — ED Notes (Signed)
Bed: UCTR Expected date:  Expected time:  Means of arrival:  Comments: 

## 2017-09-29 NOTE — ED Notes (Signed)
Pt here for two sutures removed from chin. Sutures removed without issue. Wound is well healed. Pt has no further questions.

## 2017-10-03 ENCOUNTER — Encounter: Payer: Self-pay | Admitting: Family Medicine

## 2017-10-03 ENCOUNTER — Ambulatory Visit: Payer: BLUE CROSS/BLUE SHIELD | Admitting: Family Medicine

## 2017-10-03 VITALS — BP 123/60 | HR 78 | Temp 98.6°F | Resp 16 | Ht 70.08 in | Wt 239.2 lb

## 2017-10-03 DIAGNOSIS — I10 Essential (primary) hypertension: Secondary | ICD-10-CM | POA: Diagnosis not present

## 2017-10-03 DIAGNOSIS — E1151 Type 2 diabetes mellitus with diabetic peripheral angiopathy without gangrene: Secondary | ICD-10-CM

## 2017-10-03 DIAGNOSIS — E785 Hyperlipidemia, unspecified: Secondary | ICD-10-CM | POA: Diagnosis not present

## 2017-10-03 DIAGNOSIS — Z0000 Encounter for general adult medical examination without abnormal findings: Secondary | ICD-10-CM | POA: Diagnosis not present

## 2017-10-03 DIAGNOSIS — E118 Type 2 diabetes mellitus with unspecified complications: Secondary | ICD-10-CM

## 2017-10-03 DIAGNOSIS — E1165 Type 2 diabetes mellitus with hyperglycemia: Secondary | ICD-10-CM

## 2017-10-03 DIAGNOSIS — IMO0002 Reserved for concepts with insufficient information to code with codable children: Secondary | ICD-10-CM

## 2017-10-03 MED ORDER — VALSARTAN-HYDROCHLOROTHIAZIDE 320-12.5 MG PO TABS: 1.0000 | ORAL_TABLET | Freq: Every day | ORAL | 1 refills | Status: DC

## 2017-10-03 MED ORDER — DAPAGLIFLOZIN PROPANEDIOL 10 MG PO TABS: 10.0000 mg | ORAL_TABLET | Freq: Every day | ORAL | 2 refills | Status: DC

## 2017-10-03 NOTE — Assessment & Plan Note (Signed)
Well controlled, no changes to meds. Encouraged heart healthy diet such as the DASH diet and exercise as tolerated.  °

## 2017-10-03 NOTE — Assessment & Plan Note (Signed)
Tolerating statin, encouraged heart healthy diet, avoid trans fats, minimize simple carbs and saturated fats. Increase exercise as tolerated 

## 2017-10-03 NOTE — Assessment & Plan Note (Signed)
Healthy weight and wellness  

## 2017-10-03 NOTE — Assessment & Plan Note (Signed)
>>  ASSESSMENT AND PLAN FOR ESSENTIAL HYPERTENSION WRITTEN ON 10/03/2017  5:42 PM BY LOWNE CHASE, YVONNE R, DO  Well controlled, no changes to meds. Encouraged heart healthy diet such as the DASH diet and exercise as tolerated.

## 2017-10-03 NOTE — Patient Instructions (Addendum)
tricholemmona    Carbohydrate Counting for Diabetes Mellitus, Adult Carbohydrate counting is a method for keeping track of how many carbohydrates you eat. Eating carbohydrates naturally increases the amount of sugar (glucose) in the blood. Counting how many carbohydrates you eat helps keep your blood glucose within normal limits, which helps you manage your diabetes (diabetes mellitus). It is important to know how many carbohydrates you can safely have in each meal. This is different for every person. A diet and nutrition specialist (registered dietitian) can help you make a meal plan and calculate how many carbohydrates you should have at each meal and snack. Carbohydrates are found in the following foods:  Grains, such as breads and cereals.  Dried beans and soy products.  Starchy vegetables, such as potatoes, peas, and corn.  Fruit and fruit juices.  Milk and yogurt.  Sweets and snack foods, such as cake, cookies, candy, chips, and soft drinks.  How do I count carbohydrates? There are two ways to count carbohydrates in food. You can use either of the methods or a combination of both. Reading "Nutrition Facts" on packaged food The "Nutrition Facts" list is included on the labels of almost all packaged foods and beverages in the [place] It includes:  The serving size.  Information about nutrients in each serving, including the grams (g) of carbohydrate per serving.  To use the "Nutrition Facts":  Decide how many servings you will have.  Multiply the number of servings by the number of carbohydrates per serving.  The resulting number is the total amount of carbohydrates that you will be having.  Learning standard serving sizes of other foods When you eat foods containing carbohydrates that are not packaged or do not include "Nutrition Facts" on the label, you need to measure the servings in order to count the amount of carbohydrates:  Measure the foods that you will eat with a  food scale or measuring cup, if needed.  Decide how many standard-size servings you will eat.  Multiply the number of servings by 15. Most carbohydrate-rich foods have about 15 g of carbohydrates per serving. ? For example, if you eat 8 oz (170 g) of strawberries, you will have eaten 2 servings and 30 g of carbohydrates (2 servings x 15 g = 30 g).  For foods that have more than one food mixed, such as soups and casseroles, you must count the carbohydrates in each food that is included.  The following list contains standard serving sizes of common carbohydrate-rich foods. Each of these servings has about 15 g of carbohydrates:   hamburger bun or  English muffin.   oz (15 mL) syrup.   oz (14 g) jelly.  1 slice of bread.  1 six-inch tortilla.  3 oz (85 g) cooked rice or pasta.  4 oz (113 g) cooked dried beans.  4 oz (113 g) starchy vegetable, such as peas, corn, or potatoes.  4 oz (113 g) hot cereal.  4 oz (113 g) mashed potatoes or  of a large baked potato.  4 oz (113 g) canned or frozen fruit.  4 oz (120 mL) fruit juice.  4-6 crackers.  6 chicken nuggets.  6 oz (170 g) unsweetened dry cereal.  6 oz (170 g) plain fat-free yogurt or yogurt sweetened with artificial sweeteners.  8 oz (240 mL) milk.  8 oz (170 g) fresh fruit or one small piece of fruit.  24 oz (680 g) popped popcorn.  Example of carbohydrate counting Sample meal  3 oz (  85 g) chicken breast.  6 oz (170 g) brown rice.  4 oz (113 g) corn.  8 oz (240 mL) milk.  8 oz (170 g) strawberries with sugar-free whipped topping. Carbohydrate calculation 1. Identify the foods that contain carbohydrates: ? Rice. ? Corn. ? Milk. ? Strawberries. 2. Calculate how many servings you have of each food: ? 2 servings rice. ? 1 serving corn. ? 1 serving milk. ? 1 serving strawberries. 3. Multiply each number of servings by 15 g: ? 2 servings rice x 15 g = 30 g. ? 1 serving corn x 15 g = 15 g. ? 1  serving milk x 15 g = 15 g. ? 1 serving strawberries x 15 g = 15 g. 4. Add together all of the amounts to find the total grams of carbohydrates eaten: ? 30 g + 15 g + 15 g + 15 g = 75 g of carbohydrates total. This information is not intended to replace advice given to you by your health care provider. Make sure you discuss any questions you have with your health care provider. Document Released: 05/16/2005 Document Revised: 12/04/2015 Document Reviewed: 10/28/2015 Elsevier Interactive Patient Education  [employer].

## 2017-10-03 NOTE — Progress Notes (Signed)
Subjective:  I acted as a Education administrator for [employer]. Tom Fuller, [place]   Patient ID: Tom Fuller, male    DOB: 1950-10-22, 67 y.o.   MRN: [id number]  Chief Complaint  Patient presents with  . medication check    HPI  Patient is in today for medication check.  HPI HYPERTENSION   Blood pressure range-not checking   Chest pain- no      Dyspnea- no Lightheadedness- no   Edema- no  Other side effects - no   Medication compliance: good Low salt diet- no    DIABETES    Blood Sugar ranges-high  Polyuria- no New Visual problems- no  Hypoglycemic symptoms- no  Other side effects-no Medication compliance - good Last eye exam- due Foot exam- today   HYPERLIPIDEMIA  Medication compliance- good RUQ pain- no  Muscle aches- no Other side effects-no  HYPERTENSION   Blood pressure range-not checking  Chest pain- no      Dyspnea- no Lightheadedness- no   Edema- no  Other side effects - no   Medication compliance: good Low salt diet- yes    DIABETES    Blood Sugar ranges-high  Polyuria- no New Visual problems- no  Hypoglycemic symptoms- no  Other side effects-no Medication compliance - good Last eye exam- 08/2017 Foot exam- today   HYPERLIPIDEMIA  Medication compliance- good RUQ pain- no  Muscle aches- no Other side effects-no    Patient Care Team: Ann Held, DO as PCP - General   Past Medical History:  Diagnosis Date  . Atrial fibrillation ([place])   . Cervical lymphadenopathy   . Erectile dysfunction   . GERD (gastroesophageal reflux disease)   . Hypertension   . Lymphoma, follicular ([place])    Dr.Shadad    Past Surgical History:  Procedure Laterality Date  . a bsc    . ABSCESS DRAINAGE     under rt eye   . DEEP NECK LYMPH NODE BIOPSY / EXCISION  2011  . VASECTOMY      Family History  Problem Relation Age of Onset  . Hypertension Mother   . Kidney disease Mother   . Hypertension Father   . Hypertension Brother   . Diabetes Paternal  Grandfather   . Hypertension Paternal Grandfather   . Kidney disease Maternal Aunt   . Kidney disease Maternal Grandmother     Social History   Socioeconomic History  . Marital status: Married    Spouse name: Not on file  . Number of children: Not on file  . Years of education: Not on file  . Highest education level: Not on file  Occupational History  . Not on file  Social Needs  . Financial resource strain: Not on file  . Food insecurity:    Worry: Not on file    Inability: Not on file  . Transportation needs:    Medical: Not on file    Non-medical: Not on file  Tobacco Use  . Smoking status: Never Smoker  . Smokeless tobacco: Never Used  Substance and Sexual Activity  . Alcohol use: No  . Drug use: No  . Sexual activity: Yes    Partners: Female  Lifestyle  . Physical activity:    Days per week: Not on file    Minutes per session: Not on file  . Stress: Not on file  Relationships  . Social connections:    Talks on phone: Not on file    Gets together: Not on file  Attends religious service: Not on file    Active member of club or organization: Not on file    Attends meetings of clubs or organizations: Not on file    Relationship status: Not on file  . Intimate partner violence:    Fear of current or ex partner: Not on file    Emotionally abused: Not on file    Physically abused: Not on file    Forced sexual activity: Not on file  Other Topics Concern  . Not on file  Social History Narrative  . Not on file    Outpatient Medications Prior to Visit  Medication Sig Dispense Refill  . ACCU-CHEK FASTCLIX LANCETS MISC Use as directed once a day.  DX Code: E11.8 102 each 1  . glucose blood (ACCU-CHEK GUIDE) test strip Use as directed once a day.  DX Code: E11.8 100 each 1  . metoprolol tartrate (LOPRESSOR) 25 MG tablet Take 1 tablet (25 mg total) by mouth 2 (two) times daily. 180 tablet 3  . sitaGLIPtin (JANUVIA) 100 MG tablet Take 1 tablet (100 mg total) by  mouth daily. 90 tablet 1  . dapagliflozin propanediol (FARXIGA) 5 MG TABS tablet Take 5 mg by mouth daily. 90 tablet 0  . valsartan-hydrochlorothiazide (DIOVAN-HCT) 320-12.5 MG tablet Take 1 tablet by mouth daily. Please keep your upcoming appointment for any future refills. Thank you. 90 tablet 0   No facility-administered medications prior to visit.     Allergies  Allergen Reactions  . Ampicillin Rash    Review of Systems  Constitutional: Negative for fever and malaise/fatigue.  HENT: Negative for congestion.   Eyes: Negative for blurred vision.  Respiratory: Negative for cough and shortness of breath.   Cardiovascular: Negative for chest pain, palpitations and leg swelling.  Gastrointestinal: Negative for abdominal pain, blood in stool, nausea and vomiting.  Genitourinary: Negative for dysuria and frequency.  Musculoskeletal: Negative for back pain and falls.  Skin: Negative for rash.  Neurological: Negative for dizziness, loss of consciousness and headaches.  Endo/Heme/Allergies: Negative for environmental allergies.  Psychiatric/Behavioral: Negative for depression. The patient is not nervous/anxious.        Objective:    Physical Exam  Constitutional: He is oriented to person, place, and time. Vital signs are normal. He appears well-developed and well-nourished. He is sleeping.  HENT:  Head: Normocephalic and atraumatic.  Mouth/Throat: Oropharynx is clear and moist.  Eyes: Pupils are equal, round, and reactive to light. EOM are normal.  Neck: Normal range of motion. Neck supple. No thyromegaly present.  Cardiovascular: Normal rate and regular rhythm.  No murmur heard. Pulmonary/Chest: Effort normal and breath sounds normal. No respiratory distress. He has no wheezes. He has no rales. He exhibits no tenderness.  Musculoskeletal: He exhibits no edema or tenderness.  Neurological: He is alert and oriented to person, place, and time.  Skin: Skin is warm and dry.    Psychiatric: He has a normal mood and affect. His behavior is normal. Judgment and thought content normal.   Diabetic Foot Exam - Simple   Simple Foot Form Diabetic Foot exam was performed with the following findings:  Yes 10/03/2017  5:38 PM  Visual Inspection No deformities, no ulcerations, no other skin breakdown bilaterally:  Yes Sensation Testing Intact to touch and monofilament testing bilaterally:  Yes Pulse Check Posterior Tibialis and Dorsalis pulse intact bilaterally:  Yes Comments      BP 123/60 (BP Location: Left Arm, Patient Position: Sitting, Cuff Size: Large)   Pulse 78  Temp 98.6 F (37 C) (Oral)   Resp 16   Ht 5' 10.08" (1.78 m)   Wt 239 lb 3.2 oz (108.5 kg)   SpO2 98%   BMI 34.24 kg/m  Wt Readings from Last 3 Encounters:  10/03/17 239 lb 3.2 oz (108.5 kg)  09/19/17 243 lb (110.2 kg)  03/10/17 242 lb 12.8 oz (110.1 kg)   BP Readings from Last 3 Encounters:  10/03/17 123/60  09/19/17 (!) 159/93  09/19/17 (!) 138/92     Immunization History  Administered Date(s) Administered  . Influenza Split 03/28/2011  . Td 07/25/2007    Health Maintenance  Topic Date Due  . Hepatitis C Screening  02/22/52  . OPHTHALMOLOGY EXAM  05/07/1962  . HIV Screening  05/08/1967  . COLONOSCOPY  02/15/2016  . PNA vac Low Risk Adult (1 of 2 - PCV13) 05/07/2017  . TETANUS/TDAP  07/24/2017  . INFLUENZA VACCINE  12/28/2017  . HEMOGLOBIN A1C  03/21/2018  . FOOT EXAM  10/04/2018    Lab Results  Component Value Date   WBC 9.0 03/10/2017   HGB 15.4 03/10/2017   HCT 44.6 03/10/2017   PLT 203 03/10/2017   GLUCOSE 179 (H) 09/19/2017   CHOL 124 09/19/2017   TRIG 88.0 09/19/2017   HDL 35.30 (L) 09/19/2017   LDLCALC 72 09/19/2017   ALT 29 09/19/2017   AST 23 09/19/2017   NA 136 09/19/2017   K 4.0 09/19/2017   CL 101 09/19/2017   CREATININE 0.95 09/19/2017   BUN 15 09/19/2017   CO2 29 09/19/2017   TSH 1.69 09/23/2010   PSA 0.32 09/23/2010   INR 1.07 02/13/2014    HGBA1C 10.7 (H) 09/19/2017   MICROALBUR 16.4 (H) 10/29/2015    Lab Results  Component Value Date   TSH 1.69 09/23/2010   Lab Results  Component Value Date   WBC 9.0 03/10/2017   HGB 15.4 03/10/2017   HCT 44.6 03/10/2017   MCV 94.7 03/10/2017   PLT 203 03/10/2017   Lab Results  Component Value Date   NA 136 09/19/2017   K 4.0 09/19/2017   CHLORIDE 100 03/10/2017   CO2 29 09/19/2017   GLUCOSE 179 (H) 09/19/2017   BUN 15 09/19/2017   CREATININE 0.95 09/19/2017   BILITOT 0.8 09/19/2017   ALKPHOS 68 09/19/2017   AST 23 09/19/2017   ALT 29 09/19/2017   PROT 6.8 09/19/2017   ALBUMIN 4.2 09/19/2017   CALCIUM 9.5 09/19/2017   ANIONGAP 9 03/10/2017   EGFR >60 03/10/2017   GFR 84.47 09/19/2017   Lab Results  Component Value Date   CHOL 124 09/19/2017   Lab Results  Component Value Date   HDL 35.30 (L) 09/19/2017   Lab Results  Component Value Date   LDLCALC 72 09/19/2017   Lab Results  Component Value Date   TRIG 88.0 09/19/2017   Lab Results  Component Value Date   CHOLHDL 4 09/19/2017   Lab Results  Component Value Date   HGBA1C 10.7 (H) 09/19/2017         Assessment & Plan:   Problem List Items Addressed This Visit      Unprioritized   Essential hypertension    Well controlled, no changes to meds. Encouraged heart healthy diet such as the DASH diet and exercise as tolerated.       Relevant Medications   valsartan-hydrochlorothiazide (DIOVAN-HCT) 320-12.5 MG tablet   Hyperlipidemia LDL goal <70    Tolerating statin, encouraged heart healthy diet, avoid trans fats,  minimize simple carbs and saturated fats. Increase exercise as tolerated      Relevant Medications   valsartan-hydrochlorothiazide (DIOVAN-HCT) 320-12.5 MG tablet   Morbid obesity ([place])    Healthy weight and wellness      Relevant Medications   dapagliflozin propanediol (FARXIGA) 10 MG TABS tablet   Preventative health care   Relevant Orders   Ambulatory referral to  Gastroenterology   Uncontrolled type 2 diabetes mellitus with complication, without long-term current use of insulin (HCC)    hgba1c not acceptable, minimize simple carbs. Increase exercise as tolerated. Continue current meds Inc farxiga to 10 mg daily Recheck 3 months      Relevant Medications   dapagliflozin propanediol (FARXIGA) 10 MG TABS tablet   valsartan-hydrochlorothiazide (DIOVAN-HCT) 320-12.5 MG tablet    Other Visit Diagnoses    DM (diabetes mellitus) type II uncontrolled, periph vascular disorder ([place])    -  Primary   Relevant Medications   dapagliflozin propanediol (FARXIGA) 10 MG TABS tablet   valsartan-hydrochlorothiazide (DIOVAN-HCT) 320-12.5 MG tablet   Hyperlipidemia LDL goal <100       Relevant Medications   valsartan-hydrochlorothiazide (DIOVAN-HCT) 320-12.5 MG tablet      I have discontinued Grier Rocher. Hashimi's dapagliflozin propanediol. I am also having him start on dapagliflozin propanediol. Additionally, I am having him maintain his ACCU-CHEK FASTCLIX LANCETS, glucose blood, sitaGLIPtin, metoprolol tartrate, and valsartan-hydrochlorothiazide.  Meds ordered this encounter  Medications  . dapagliflozin propanediol (FARXIGA) 10 MG TABS tablet    Sig: Take 10 mg by mouth daily.    Dispense:  30 tablet    Refill:  2  . valsartan-hydrochlorothiazide (DIOVAN-HCT) 320-12.5 MG tablet    Sig: Take 1 tablet by mouth daily. Please keep your upcoming appointment for any future refills. Thank you.    Dispense:  90 tablet    Refill:  1    Please keep your upcoming appointment for any future refills. Thank you.    CMA served as Education administrator during this visit. History, Physical and Plan performed by medical provider. Documentation and orders reviewed and attested to.  Ann Held, DO

## 2017-10-03 NOTE — Assessment & Plan Note (Signed)
>>  ASSESSMENT AND PLAN FOR HYPERLIPIDEMIA LDL GOAL <70 WRITTEN ON 10/03/2017  5:42 PM BY LOWNE CHASE, YVONNE R, DO  Tolerating statin, encouraged heart healthy diet, avoid trans fats, minimize simple carbs and saturated fats. Increase exercise as tolerated

## 2017-10-03 NOTE — Assessment & Plan Note (Addendum)
hgba1c not acceptable, minimize simple carbs. Increase exercise as tolerated. Continue current meds Inc farxiga to 10 mg daily Recheck 3 months

## 2017-10-06 ENCOUNTER — Inpatient Hospital Stay: Payer: BLUE CROSS/BLUE SHIELD | Attending: Oncology

## 2017-10-06 ENCOUNTER — Inpatient Hospital Stay: Payer: BLUE CROSS/BLUE SHIELD | Admitting: Oncology

## 2017-10-06 VITALS — BP 143/90 | HR 82 | Temp 98.2°F | Resp 17 | Ht 70.08 in | Wt 238.7 lb

## 2017-10-06 DIAGNOSIS — Z7982 Long term (current) use of aspirin: Secondary | ICD-10-CM | POA: Insufficient documentation

## 2017-10-06 DIAGNOSIS — Z880 Allergy status to penicillin: Secondary | ICD-10-CM | POA: Diagnosis not present

## 2017-10-06 DIAGNOSIS — I4891 Unspecified atrial fibrillation: Secondary | ICD-10-CM

## 2017-10-06 DIAGNOSIS — Z9221 Personal history of antineoplastic chemotherapy: Secondary | ICD-10-CM | POA: Diagnosis not present

## 2017-10-06 DIAGNOSIS — Z79899 Other long term (current) drug therapy: Secondary | ICD-10-CM | POA: Diagnosis not present

## 2017-10-06 DIAGNOSIS — C8280 Other types of follicular lymphoma, unspecified site: Secondary | ICD-10-CM

## 2017-10-06 DIAGNOSIS — Z8572 Personal history of non-Hodgkin lymphomas: Secondary | ICD-10-CM | POA: Insufficient documentation

## 2017-10-06 LAB — COMPREHENSIVE METABOLIC PANEL
ALBUMIN: 4.2 g/dL (ref 3.5–5.0)
ALK PHOS: 81 U/L (ref 40–150)
ALT: 39 U/L (ref 0–55)
AST: 27 U/L (ref 5–34)
Anion gap: 9 (ref 3–11)
BILIRUBIN TOTAL: 0.8 mg/dL (ref 0.2–1.2)
BUN: 22 mg/dL (ref 7–26)
CALCIUM: 9.8 mg/dL (ref 8.4–10.4)
CO2: 29 mmol/L (ref 22–29)
Chloride: 100 mmol/L (ref 98–109)
Creatinine, Ser: 1.36 mg/dL — ABNORMAL HIGH (ref 0.70–1.30)
GFR calc Af Amer: >60 mL/min (ref >60)
GFR calc non Af Amer: 53 mL/min — ABNORMAL LOW (ref >60)
Glucose, Bld: 275 mg/dL — ABNORMAL HIGH (ref 70–140)
Potassium: 3.9 mmol/L (ref 3.5–5.1)
SODIUM: 138 mmol/L (ref 136–145)
TOTAL PROTEIN: 6.8 g/dL (ref 6.4–8.3)

## 2017-10-06 LAB — CBC WITH DIFFERENTIAL/PLATELET
BASOS ABS: 0.1 10*3/uL (ref 0.0–0.1)
BASOS PCT: 1 %
EOS PCT: 5 %
Eosinophils Absolute: 0.4 10*3/uL (ref 0.0–0.5)
HEMATOCRIT: 45.2 % (ref 38.4–49.9)
Hemoglobin: 15.5 g/dL (ref 13.0–17.1)
LYMPHS PCT: 27 %
Lymphs Abs: 2.3 10*3/uL (ref 0.9–3.3)
MCH: 31.8 pg (ref 27.2–33.4)
MCHC: 34.3 g/dL (ref 32.0–36.0)
MCV: 92.6 fL (ref 79.3–98.0)
MONOS PCT: 11 %
Monocytes Absolute: 0.9 10*3/uL (ref 0.1–0.9)
Neutro Abs: 4.9 10*3/uL (ref 1.5–6.5)
Neutrophils Relative %: 56 %
PLATELETS: 199 10*3/uL (ref 140–400)
RBC: 4.88 MIL/uL (ref 4.20–5.82)
RDW: 13.7 % (ref 11.0–14.6)
WBC: 8.7 10*3/uL (ref 4.0–10.3)

## 2017-10-06 NOTE — Progress Notes (Signed)
Hematology and Oncology Follow Up Visit  Tom Fuller [id number] [birth year] 67 y.o. 10/06/2017 3:17 PM    CC: [name] [name], MD  [name], [name]  [name], [name]: 67 year old man with stage IIIA low-grade follicular lymphoma diagnosed May 2010.  Past Therapy:  He is S/P Bendamustine and rituximab started on 08/25/11 for 6 cycles completed in 12/2011 and achieved complete response at this time.  Current therapy: Active surveillance.  Interim History:  Mr. Flury presents today for a follow-up visit.  Since her last visit, he reports no major changes in his health.  He had a lesion on his chin that was removed April 2019 and showed benign findings.  He did have a postoperative bleeding while he was on aspirin.  He denies any constitutional symptoms or changes in his energy her performance status.  He continues to work full-time.  He denies any abnormal masses or lesions in his neck, axilla or groin.  His performance status and quality of life remain excellent.  He denies any headaches, blurry vision or syncope.  He denies any alteration in mental status or neuropathy.  He does not report any fevers, chills, sweats. He did not report any chest pain, shortness of breath or leg edema.  He does not report any cough, wheezing or hemoptysis. He does not report any nausea, change in his bowel habits. He does not report any frequency urgency or hematuria.  He denies any arthralgias or myalgias.  He denies any skin rashes or lesions.  He denies any petechia or ecchymosis.  He denies any easy bruising.  The remaining review of systems is negative.  Medications: I have reviewed the patient's current medications.  Current Outpatient Medications  Medication Sig Dispense Refill  . ACCU-CHEK FASTCLIX LANCETS MISC Use as directed once a day.  DX Code: E11.8 102 each 1  . dapagliflozin propanediol (FARXIGA) 10 MG TABS tablet Take 10 mg by mouth daily. 30 tablet 2  . glucose  blood (ACCU-CHEK GUIDE) test strip Use as directed once a day.  DX Code: E11.8 100 each 1  . metoprolol tartrate (LOPRESSOR) 25 MG tablet Take 1 tablet (25 mg total) by mouth 2 (two) times daily. 180 tablet 3  . sitaGLIPtin (JANUVIA) 100 MG tablet Take 1 tablet (100 mg total) by mouth daily. 90 tablet 1  . valsartan-hydrochlorothiazide (DIOVAN-HCT) 320-12.5 MG tablet Take 1 tablet by mouth daily. Please keep your upcoming appointment for any future refills. Thank you. 90 tablet 1   No current facility-administered medications for this visit.     Allergies:  Allergies  Allergen Reactions  . Ampicillin Rash    Past Medical History, Surgical history, Social history, and Family History remained unchanged.    Physical Exam:  ECOG: 0 General appearance: Alert, awake gentleman without distress. Head: Atraumatic without abnormalities. Oropharynx without any thrush or ulcers. Eyes: No scleral icterus. Lymph nodes: No cervical, inguinal or axillary adenopathy noted on exam. Heart: Irregular without murmurs. Lung: Clear to auscultation without any rhonchi, wheezes or dullness to percussion. Abdomen: Soft, nontender without any rebound or guarding.. Skin: No ecchymosis or petechiae.. Skin skeletal: No joint deformity or effusion.  Lab Results: Lab Results  Component Value Date   WBC 8.7 10/06/2017   HGB 15.5 10/06/2017   HCT 45.2 10/06/2017   MCV 92.6 10/06/2017   PLT 199 10/06/2017       Impression and Plan:  67 year old man with:   1.  Follicular lymphoma diagnosed  in May 2010 and at that time he had stage IIIA disease.  He received systemic chemotherapy with bendamustine and rituximab and achieved a complete response 2013.  He remained on active surveillance since that time.  He has no clinical signs or symptoms to suggest recurrent disease on his laboratory data or examination.  The natural course of this disease was reviewed today with him and his wife and the  importance of active surveillance is documented.  We will continue to follow him clinically and repeat imaging studies if he develops any symptoms or palpable adenopathy.   2. Atrial fibrillation: Rate controlled continues to follow with cardiology.  He is on full dose aspirin.  3.  Age-appropriate cancer screening.  He is up-to-date at this time including colonoscopy.  4.  Follow-up: We will be in 6 months.  15  minutes was spent with the patient face-to-face today.  More than 50% of time was dedicated to patient counseling, education and coordination of his care.     Zola Button 5/10/20193:17 PM

## 2017-11-06 ENCOUNTER — Encounter: Payer: Self-pay | Admitting: Family Medicine

## 2017-11-09 ENCOUNTER — Encounter: Payer: Self-pay | Admitting: Internal Medicine

## 2017-11-09 ENCOUNTER — Ambulatory Visit: Payer: BLUE CROSS/BLUE SHIELD | Admitting: Internal Medicine

## 2017-11-09 VITALS — BP 100/60 | HR 77 | Ht 70.0 in | Wt 238.0 lb

## 2017-11-09 DIAGNOSIS — I480 Paroxysmal atrial fibrillation: Secondary | ICD-10-CM

## 2017-11-09 DIAGNOSIS — I10 Essential (primary) hypertension: Secondary | ICD-10-CM

## 2017-11-09 MED ORDER — APIXABAN 5 MG PO TABS: 5.0000 mg | ORAL_TABLET | Freq: Two times a day (BID) | ORAL | 11 refills | Status: DC

## 2017-11-09 NOTE — Patient Instructions (Addendum)
Medication Instructions:  Your physician has recommended you make the following change in your medication:   1. STOP TAKING ASPIRIN  2.  Start taking Eliquis 5 mg one tablet by mouth twice a day.  Labwork: You will need a BMP and CBC in one month. Please schedule for lab work in one month.  Testing/Procedures: None ordered.  Follow-Up: Your physician wants you to follow-up in: one year with Dr. [name].   You will receive a reminder letter in the mail two months in advance. If you don't receive a letter, please call our office to schedule the follow-up appointment.  Any Other Special Instructions Will Be Listed Below (If Applicable).  If you need a refill on your cardiac medications before your next appointment, please call your pharmacy.    Apixaban oral tablets What is this medicine? APIXABAN (a PIX a ban) is an anticoagulant (blood thinner). It is used to lower the chance of stroke in people with a medical condition called atrial fibrillation. It is also used to treat or prevent blood clots in the lungs or in the veins. This medicine may be used for other purposes; ask your health care provider or pharmacist if you have questions. COMMON BRAND NAME(S): Eliquis What should I tell my health care provider before I take this medicine? They need to know if you have any of these conditions: -bleeding disorders -bleeding in the brain -blood in your stools (black or tarry stools) or if you have blood in your vomit -history of stomach bleeding -kidney disease -liver disease -mechanical heart valve -an unusual or allergic reaction to apixaban, other medicines, foods, dyes, or preservatives -pregnant or trying to get pregnant -breast-feeding How should I use this medicine? Take this medicine by mouth with a glass of water. Follow the directions on the prescription label. You can take it with or without food. If it upsets your stomach, take it with food. Take your medicine at regular  intervals. Do not take it more often than directed. Do not stop taking except on your doctor's advice. Stopping this medicine may increase your risk of a blot clot. Be sure to refill your prescription before you run out of medicine. Talk to your pediatrician regarding the use of this medicine in children. Special care may be needed. Overdosage: If you think you have taken too much of this medicine contact a poison control center or emergency room at once. NOTE: This medicine is only for you. Do not share this medicine with others. What if I miss a dose? If you miss a dose, take it as soon as you can. If it is almost time for your next dose, take only that dose. Do not take double or extra doses. What may interact with this medicine? This medicine may interact with the following: -aspirin and aspirin-like medicines -certain medicines for fungal infections like ketoconazole and itraconazole -certain medicines for seizures like carbamazepine and phenytoin -certain medicines that treat or prevent blood clots like warfarin, enoxaparin, and dalteparin -clarithromycin -NSAIDs, medicines for pain and inflammation, like ibuprofen or naproxen -rifampin -ritonavir -St. John's wort This list may not describe all possible interactions. Give your health care provider a list of all the medicines, herbs, non-prescription drugs, or dietary supplements you use. Also tell them if you smoke, drink alcohol, or use illegal drugs. Some items may interact with your medicine. What should I watch for while using this medicine? Visit your doctor or health care professional for regular checks on your progress. Notify your doctor  or health care professional and seek emergency treatment if you develop breathing problems; changes in vision; chest pain; severe, sudden headache; pain, swelling, warmth in the leg; trouble speaking; sudden numbness or weakness of the face, arm or leg. These can be signs that your condition has  gotten worse. If you are going to have surgery or other procedure, tell your doctor that you are taking this medicine. What side effects may I notice from receiving this medicine? Side effects that you should report to your doctor or health care professional as soon as possible: -allergic reactions like skin rash, itching or hives, swelling of the face, lips, or tongue -signs and symptoms of bleeding such as bloody or black, tarry stools; red or dark-brown urine; spitting up blood or brown material that looks like coffee grounds; red spots on the skin; unusual bruising or bleeding from the eye, gums, or nose This list may not describe all possible side effects. Call your doctor for medical advice about side effects. You may report side effects to FDA at 1-800-FDA-1088. Where should I keep my medicine? Keep out of the reach of children. Store at room temperature between 20 and 25 degrees C (68 and 77 degrees F). Throw away any unused medicine after the expiration date. NOTE: This sheet is a summary. It may not cover all possible information. If you have questions about this medicine, talk to your doctor, pharmacist, or health care provider.  2018 Elsevier/Gold Standard (2015-12-07 11:54:23)

## 2017-11-09 NOTE — Progress Notes (Addendum)
HPI [name] returns for followup. He is a pleasant 67 yo man with morbid obesity, chronic atrial fibrillation, hypertension, who returns today for follow-up. In the interim, he has done well except for difficulty losing weight. He denies chest pain or shortness of breath. He has  mild peripheral edema. He is trying to avoid sodium indiscretion.  He remains working a regular job. He has not had syncope. He has felt weak and a lack of energy.   Allergies  Allergen Reactions  . Ampicillin Rash     Current Outpatient Medications  Medication Sig Dispense Refill  . ACCU-CHEK FASTCLIX LANCETS MISC Use as directed once a day.  DX Code: E11.8 102 each 1  . aspirin 325 MG tablet Take 325 mg by mouth daily.    . dapagliflozin propanediol (FARXIGA) 10 MG TABS tablet Take 10 mg by mouth daily. 30 tablet 2  . glucose blood (ACCU-CHEK GUIDE) test strip Use as directed once a day.  DX Code: E11.8 100 each 1  . metoprolol tartrate (LOPRESSOR) 25 MG tablet Take 1 tablet (25 mg total) by mouth 2 (two) times daily. 180 tablet 3  . sitaGLIPtin (JANUVIA) 100 MG tablet Take 1 tablet (100 mg total) by mouth daily. 90 tablet 1  . valsartan-hydrochlorothiazide (DIOVAN-HCT) 320-12.5 MG tablet Take 1 tablet by mouth daily. Please keep your upcoming appointment for any future refills. Thank you. 90 tablet 1   No current facility-administered medications for this visit.      Past Medical History:  Diagnosis Date  . Atrial fibrillation ([place])   . Cervical lymphadenopathy   . Erectile dysfunction   . GERD (gastroesophageal reflux disease)   . Hypertension   . Lymphoma, follicular ([place])    Dr.Shadad    ROS:   All systems reviewed and negative except as noted in the HPI.   Past Surgical History:  Procedure Laterality Date  . a bsc    . ABSCESS DRAINAGE     under rt eye   . DEEP NECK LYMPH NODE BIOPSY / EXCISION  2011  . VASECTOMY       Family History  Problem Relation Age of Onset  .  Hypertension Mother   . Kidney disease Mother   . Hypertension Father   . Hypertension Brother   . Diabetes Paternal Grandfather   . Hypertension Paternal Grandfather   . Kidney disease Maternal Aunt   . Kidney disease Maternal Grandmother      Social History   Socioeconomic History  . Marital status: Married    Spouse name: Not on file  . Number of children: Not on file  . Years of education: Not on file  . Highest education level: Not on file  Occupational History  . Not on file  Social Needs  . Financial resource strain: Not on file  . Food insecurity:    Worry: Not on file    Inability: Not on file  . Transportation needs:    Medical: Not on file    Non-medical: Not on file  Tobacco Use  . Smoking status: Never Smoker  . Smokeless tobacco: Never Used  Substance and Sexual Activity  . Alcohol use: No  . Drug use: No  . Sexual activity: Yes    Partners: Female  Lifestyle  . Physical activity:    Days per week: Not on file    Minutes per session: Not on file  . Stress: Not on file  Relationships  . Social connections:  Talks on phone: Not on file    Gets together: Not on file    Attends religious service: Not on file    Active member of club or organization: Not on file    Attends meetings of clubs or organizations: Not on file    Relationship status: Not on file  . Intimate partner violence:    Fear of current or ex partner: Not on file    Emotionally abused: Not on file    Physically abused: Not on file    Forced sexual activity: Not on file  Other Topics Concern  . Not on file  Social History Narrative  . Not on file     BP 100/60   Pulse 77   Ht 5\' 10"  (1.778 m)   Wt 238 lb (108 kg)   SpO2 95%   BMI 34.15 kg/m   Physical Exam:  Well appearing 67 yo man, NAD HEENT: Unremarkable Neck:  7 cm JVD, no thyromegally Lymphatics:  No adenopathy Back:  No CVA tenderness Lungs:  Clear with no wheezes HEART:  Regular rate rhythm, no murmurs, no  rubs, no clicks Abd:  soft, positive bowel sounds, no organomegally, no rebound, no guarding Ext:  2 plus pulses, no edema, no cyanosis, no clubbing Skin:  No rashes no nodules Neuro:  CN II through XII intact, motor grossly intact  EKG - atrial fib with a controlled VR   Assess/Plan: 1. Atrial fib - his ventricular rate is well controlled. He will continue his current meds. 2. HTN heart disease - his blood pressure is well controlled. He will continue his current meds. 3. Chronic diastolic heart failure - his peripheral edema is improved. His weight is down 5 lbs. He is encouraged to maintain a low sodium diet.  Tom Fuller.D.

## 2017-11-21 ENCOUNTER — Other Ambulatory Visit: Payer: Self-pay | Admitting: Family Medicine

## 2017-11-21 DIAGNOSIS — E1165 Type 2 diabetes mellitus with hyperglycemia: Principal | ICD-10-CM

## 2017-11-21 DIAGNOSIS — E1151 Type 2 diabetes mellitus with diabetic peripheral angiopathy without gangrene: Secondary | ICD-10-CM

## 2017-11-21 DIAGNOSIS — IMO0002 Reserved for concepts with insufficient information to code with codable children: Secondary | ICD-10-CM

## 2017-11-21 NOTE — Telephone Encounter (Signed)
Copied from [place] [id number]. Topic: Quick Communication - Rx Refill/Question >> Nov 21, 2017  4:04 PM Boyd Kerbs wrote: Medication:  dapagliflozin propanediol (FARXIGA) 10 MG TABS tablet  He is asking if this can be sent in for a 90 day supply - He will be out 1st part of July and had sent in to refill   Has the patient contacted their pharmacy? No. (Agent: If no, request that the patient contact the pharmacy for the refill.) (Agent: If yes, when and what did the pharmacy advise?)  Preferred Pharmacy (with phone number or street name):   [id number] [address], [address] [place] [address] Phone: [phone] Fax: [phone]    Agent: Please be advised that RX refills may take up to 3 business days. We ask that you follow-up with your pharmacy.

## 2017-11-22 NOTE — Telephone Encounter (Signed)
Farxiga refill. Pt requesting a 90 day supply of medication Last Refill:10/03/17 #30 with 2 refills  Last OV: 10/03/17 PCP: Dr. Carollee Herter [facility]   [place]

## 2017-11-23 MED ORDER — DAPAGLIFLOZIN PROPANEDIOL 10 MG PO TABS: 10.0000 mg | ORAL_TABLET | Freq: Every day | ORAL | 4 refills | Status: DC

## 2017-12-02 ENCOUNTER — Other Ambulatory Visit: Payer: Self-pay | Admitting: Family Medicine

## 2017-12-11 ENCOUNTER — Other Ambulatory Visit: Payer: BLUE CROSS/BLUE SHIELD | Admitting: *Deleted

## 2017-12-11 DIAGNOSIS — I10 Essential (primary) hypertension: Secondary | ICD-10-CM

## 2017-12-11 DIAGNOSIS — I480 Paroxysmal atrial fibrillation: Secondary | ICD-10-CM | POA: Diagnosis not present

## 2017-12-12 LAB — BASIC METABOLIC PANEL
BUN / CREAT RATIO: 16 (ref 10–24)
BUN: 17 mg/dL (ref 8–27)
CALCIUM: 9.6 mg/dL (ref 8.6–10.2)
CHLORIDE: 95 mmol/L — AB (ref 96–106)
CO2: 25 mmol/L (ref 20–29)
Creatinine, Ser: 1.05 mg/dL (ref 0.76–1.27)
GFR calc non Af Amer: 74 mL/min/{1.73_m2} (ref >59)
GFR, EST AFRICAN AMERICAN: 86 mL/min/{1.73_m2} (ref >59)
Glucose: 228 mg/dL — ABNORMAL HIGH (ref 65–99)
POTASSIUM: 3.9 mmol/L (ref 3.5–5.2)
SODIUM: 137 mmol/L (ref 134–144)

## 2017-12-12 LAB — CBC WITH DIFFERENTIAL/PLATELET
BASOS ABS: 0.1 10*3/uL (ref 0.0–0.2)
Basos: 1 %
EOS (ABSOLUTE): 0.3 10*3/uL (ref 0.0–0.4)
Eos: 3 %
HEMOGLOBIN: 16.1 g/dL (ref 13.0–17.7)
Hematocrit: 46.4 % (ref 37.5–51.0)
Immature Grans (Abs): 0 10*3/uL (ref 0.0–0.1)
Immature Granulocytes: 0 %
Lymphocytes Absolute: 2.7 10*3/uL (ref 0.7–3.1)
Lymphs: 28 %
MCH: 31.9 pg (ref 26.6–33.0)
MCHC: 34.7 g/dL (ref 31.5–35.7)
MCV: 92 fL (ref 79–97)
MONOS ABS: 1 10*3/uL — AB (ref 0.1–0.9)
Monocytes: 10 %
NEUTROS ABS: 5.7 10*3/uL (ref 1.4–7.0)
Neutrophils: 58 %
Platelets: 199 10*3/uL (ref 150–450)
RBC: 5.04 x10E6/uL (ref 4.14–5.80)
RDW: 13.6 % (ref 12.3–15.4)
WBC: 9.7 10*3/uL (ref 3.4–10.8)

## 2018-01-04 ENCOUNTER — Other Ambulatory Visit: Payer: Self-pay | Admitting: Family Medicine

## 2018-01-04 ENCOUNTER — Ambulatory Visit: Payer: BLUE CROSS/BLUE SHIELD | Admitting: Family Medicine

## 2018-01-12 ENCOUNTER — Ambulatory Visit (HOSPITAL_BASED_OUTPATIENT_CLINIC_OR_DEPARTMENT_OTHER)
Admission: RE | Admit: 2018-01-12 | Discharge: 2018-01-12 | Disposition: A | Discharge status: Home / Self Care | Payer: BLUE CROSS/BLUE SHIELD | Source: Ambulatory Visit | Attending: Family Medicine | Admitting: Family Medicine

## 2018-01-12 ENCOUNTER — Ambulatory Visit: Payer: BLUE CROSS/BLUE SHIELD | Admitting: Family Medicine

## 2018-01-12 ENCOUNTER — Encounter (INDEPENDENT_AMBULATORY_CARE_PROVIDER_SITE_OTHER): Payer: Self-pay

## 2018-01-12 ENCOUNTER — Encounter: Payer: Self-pay | Admitting: Family Medicine

## 2018-01-12 VITALS — BP 115/60 | HR 81 | Temp 99.1°F | Resp 16 | Ht 70.0 in | Wt 236.4 lb

## 2018-01-12 DIAGNOSIS — I10 Essential (primary) hypertension: Secondary | ICD-10-CM | POA: Diagnosis not present

## 2018-01-12 DIAGNOSIS — E1151 Type 2 diabetes mellitus with diabetic peripheral angiopathy without gangrene: Secondary | ICD-10-CM

## 2018-01-12 DIAGNOSIS — M25521 Pain in right elbow: Secondary | ICD-10-CM | POA: Insufficient documentation

## 2018-01-12 DIAGNOSIS — E785 Hyperlipidemia, unspecified: Secondary | ICD-10-CM

## 2018-01-12 DIAGNOSIS — E118 Type 2 diabetes mellitus with unspecified complications: Secondary | ICD-10-CM

## 2018-01-12 DIAGNOSIS — R937 Abnormal findings on diagnostic imaging of other parts of musculoskeletal system: Secondary | ICD-10-CM | POA: Insufficient documentation

## 2018-01-12 DIAGNOSIS — I480 Paroxysmal atrial fibrillation: Secondary | ICD-10-CM

## 2018-01-12 DIAGNOSIS — IMO0002 Reserved for concepts with insufficient information to code with codable children: Secondary | ICD-10-CM

## 2018-01-12 DIAGNOSIS — E1165 Type 2 diabetes mellitus with hyperglycemia: Secondary | ICD-10-CM

## 2018-01-12 DIAGNOSIS — M7989 Other specified soft tissue disorders: Secondary | ICD-10-CM | POA: Diagnosis not present

## 2018-01-12 DIAGNOSIS — Z1159 Encounter for screening for other viral diseases: Secondary | ICD-10-CM

## 2018-01-12 DIAGNOSIS — Z23 Encounter for immunization: Secondary | ICD-10-CM | POA: Diagnosis not present

## 2018-01-12 MED ORDER — DOXYCYCLINE HYCLATE 100 MG PO TABS: 100.0000 mg | ORAL_TABLET | Freq: Two times a day (BID) | ORAL | 0 refills | Status: DC

## 2018-01-12 NOTE — Progress Notes (Signed)
Patient ID: Tom Fuller, male    DOB: 04/04/1951  Age: 67 y.o. MRN: [id number]    Subjective:  Subjective  HPI [name] presents for f/u dm and cholesterol -- pt never started eliquis because he said he never heard from cards.  We showed pt his message in my chart for him.   He never checked it.  He has been off aspirin and eliquis for 1 month.    HYPERTENSION   Blood pressure range-not checking   Chest pain- no      Dyspnea- no Lightheadedness- no   Edema- no  Other side effects - no   Medication compliance: good Low salt diet- yes    DIABETES    Blood Sugar ranges-high  Polyuria- no New Visual problems- no  Hypoglycemic symptoms- no  Other side effects-no Medication compliance - good Last eye exam- about 1 month ago Foot exam- today   HYPERLIPIDEMIA  Medication compliance- good RUQ pain- no  Muscle aches- no Other side effects-no  Review of Systems  Constitutional: Negative for chills and fever.  HENT: Negative for congestion and hearing loss.   Eyes: Negative for discharge.  Respiratory: Negative for cough and shortness of breath.   Cardiovascular: Negative for chest pain, palpitations and leg swelling.  Gastrointestinal: Negative for abdominal pain, blood in stool, constipation, diarrhea, nausea and vomiting.  Genitourinary: Negative for dysuria, frequency, hematuria and urgency.  Musculoskeletal: Negative for back pain and myalgias.  Skin: Negative for rash.  Allergic/Immunologic: Negative for environmental allergies.  Neurological: Negative for dizziness, weakness and headaches.  Hematological: Does not bruise/bleed easily.  Psychiatric/Behavioral: Negative for suicidal ideas. The patient is not nervous/anxious.     History Past Medical History:  Diagnosis Date  . Atrial fibrillation ([facility])   . Cervical lymphadenopathy   . Erectile dysfunction   . GERD (gastroesophageal reflux disease)   . Hypertension   . Lymphoma, follicular ([facility])    Dr.Shadad     He has a past surgical history that includes Vasectomy; a bsc; Abscess drainage; and Deep neck lymph node biopsy / excision (2011).   His family history includes Diabetes in his paternal grandfather; Hypertension in his brother, father, mother, and paternal grandfather; Kidney disease in his maternal aunt, maternal grandmother, and mother.He reports that he has never smoked. He has never used smokeless tobacco. He reports that he does not drink alcohol or use drugs.  Current Outpatient Medications on File Prior to Visit  Medication Sig Dispense Refill  . ACCU-CHEK FASTCLIX LANCETS MISC Use as directed once a day.  DX Code: E11.8 102 each 1  . apixaban (ELIQUIS) 5 MG TABS tablet Take 1 tablet (5 mg total) by mouth 2 (two) times daily. 60 tablet 11  . dapagliflozin propanediol (FARXIGA) 10 MG TABS tablet Take 10 mg by mouth daily. 30 tablet 4  . glucose blood (ACCU-CHEK GUIDE) test strip Use as directed once a day.  DX Code: E11.8 100 each 1  . JANUVIA 100 MG tablet TAKE 1 TABLET BY MOUTH EVERY DAY 90 tablet 1  . metoprolol tartrate (LOPRESSOR) 25 MG tablet Take 1 tablet (25 mg total) by mouth 2 (two) times daily. 180 tablet 3  . valsartan-hydrochlorothiazide (DIOVAN-HCT) 320-12.5 MG tablet Take 1 tablet by mouth daily. Please keep your upcoming appointment for any future refills. Thank you. 90 tablet 1   No current facility-administered medications on file prior to visit.      Objective:  Objective  Physical Exam  Constitutional: He is oriented   to person, place, and time. Vital signs are normal. He appears well-developed and well-nourished. He is sleeping.  HENT:  Head: Normocephalic and atraumatic.  Mouth/Throat: Oropharynx is clear and moist.  Eyes: Pupils are equal, round, and reactive to light. EOM are normal.  Neck: Normal range of motion. Neck supple. No thyromegaly present.  Cardiovascular: Normal rate and regular rhythm.  No murmur heard. Pulmonary/Chest: Effort normal and  breath sounds normal. No respiratory distress. He has no wheezes. He has no rales. He exhibits no tenderness.  Musculoskeletal: He exhibits edema and tenderness.       Right elbow: He exhibits swelling.       Arms: Neurological: He is alert and oriented to person, place, and time.  Skin: Skin is warm and dry.  Psychiatric: He has a normal mood and affect. His behavior is normal. Judgment and thought content normal.  Nursing note and vitals reviewed.  Diabetic Foot Exam - Simple   Simple Foot Form Diabetic Foot exam was performed with the following findings:  Yes 01/12/2018  4:36 PM  Visual Inspection No deformities, no ulcerations, no other skin breakdown bilaterally:  Yes Sensation Testing Intact to touch and monofilament testing bilaterally:  Yes Pulse Check Posterior Tibialis and Dorsalis pulse intact bilaterally:  Yes Comments     BP 115/60 (BP Location: Left Arm, Cuff Size: Normal)   Pulse 81   Temp 99.1 F (37.3 C) (Oral)   Resp 16   Ht 5' 10" (1.778 m)   Wt 236 lb 6.4 oz (107.2 kg)   SpO2 98%   BMI 33.92 kg/m  Wt Readings from Last 3 Encounters:  01/12/18 236 lb 6.4 oz (107.2 kg)  11/09/17 238 lb (108 kg)  10/06/17 238 lb 11.2 oz (108.3 kg)     Lab Results  Component Value Date   WBC 9.7 12/11/2017   HGB 16.1 12/11/2017   HCT 46.4 12/11/2017   PLT 199 12/11/2017   GLUCOSE 304 (H) 01/12/2018   CHOL 134 01/12/2018   TRIG 132 01/12/2018   HDL 31 (L) 01/12/2018   LDLCALC 80 01/12/2018   ALT 31 01/12/2018   AST 22 01/12/2018   NA 136 01/12/2018   K 4.5 01/12/2018   CL 96 (L) 01/12/2018   CREATININE 1.11 01/12/2018   BUN 21 01/12/2018   CO2 31 01/12/2018   TSH 1.69 09/23/2010   PSA 0.32 09/23/2010   INR 1.07 02/13/2014   HGBA1C 12.1 (H) 01/12/2018   MICROALBUR 16.4 (H) 10/29/2015    No results found.   Assessment & Plan:  Plan  I have discontinued [place]. I am also having him start on doxycycline. Additionally, I am having him  maintain his ACCU-CHEK FASTCLIX LANCETS, glucose blood, metoprolol tartrate, valsartan-hydrochlorothiazide, apixaban, dapagliflozin propanediol, and JANUVIA.  Meds ordered this encounter  Medications  . doxycycline (VIBRA-TABS) 100 MG tablet    Sig: Take 1 tablet (100 mg total) by mouth 2 (two) times daily.    Dispense:  20 tablet    Refill:  0    Problem List Items Addressed This Visit      Unprioritized   ATRIAL FIBRILLATION    Pt will restart eliquis and f/u cardiology      Essential hypertension    Well controlled, no changes to meds. Encouraged heart healthy diet such as the DASH diet and exercise as tolerated.       Relevant Orders   Comp Met (CMET) (Completed)   Hyperlipidemia LDL goal <70  Tolerating statin, encouraged heart healthy diet, avoid trans fats, minimize simple carbs and saturated fats. Increase exercise as tolerated      Relevant Orders   Lipid panel (Completed)   Uncontrolled type 2 diabetes mellitus with complication, without long-term current use of insulin ([facility])    hgba1c acceptable, minimize simple carbs. Increase exercise as tolerated. Continue current meds        Other Visit Diagnoses    DM (diabetes mellitus) type II uncontrolled, periph vascular disorder ([facility])    -  Primary   Relevant Orders   Hemoglobin A1c (Completed)   Need for hepatitis C screening test       Relevant Orders   Hepatitis C antibody   Right elbow pain       Relevant Medications   doxycycline (VIBRA-TABS) 100 MG tablet   Other Relevant Orders   Uric acid   DG Elbow Complete Right   Need for Tdap vaccination       Relevant Orders   Tdap vaccine greater than or equal to 7yo IM (Completed)      Follow-up: Return in about 6 months (around 07/15/2018).  Yvonne R Lowne Chase, DO     

## 2018-01-12 NOTE — Patient Instructions (Signed)

## 2018-01-13 NOTE — Assessment & Plan Note (Signed)
Well controlled, no changes to meds. Encouraged heart healthy diet such as the DASH diet and exercise as tolerated.  °

## 2018-01-13 NOTE — Assessment & Plan Note (Signed)
Tolerating statin, encouraged heart healthy diet, avoid trans fats, minimize simple carbs and saturated fats. Increase exercise as tolerated 

## 2018-01-13 NOTE — Assessment & Plan Note (Signed)
Pt will restart eliquis and f/u cardiology

## 2018-01-13 NOTE — Assessment & Plan Note (Addendum)
hgba1c acceptable, minimize simple carbs. Increase exercise as tolerated. Continue current meds 

## 2018-01-13 NOTE — Assessment & Plan Note (Signed)
>>  ASSESSMENT AND PLAN FOR HYPERLIPIDEMIA LDL GOAL <70 WRITTEN ON 01/13/2018  3:21 PM BY LOWNE CHASE, YVONNE R, DO  Tolerating statin, encouraged heart healthy diet, avoid trans fats, minimize simple carbs and saturated fats. Increase exercise as tolerated

## 2018-01-13 NOTE — Assessment & Plan Note (Signed)
>>  ASSESSMENT AND PLAN FOR ESSENTIAL HYPERTENSION WRITTEN ON 01/13/2018  3:20 PM BY LOWNE CHASE, YVONNE R, DO  Well controlled, no changes to meds. Encouraged heart healthy diet such as the DASH diet and exercise as tolerated.

## 2018-01-15 LAB — COMPREHENSIVE METABOLIC PANEL
AG Ratio: 1.8 (calc) (ref 1.0–2.5)
ALBUMIN MSPROF: 4.4 g/dL (ref 3.6–5.1)
ALT: 31 U/L (ref 9–46)
AST: 22 U/L (ref 10–35)
Alkaline phosphatase (APISO): 88 U/L (ref 40–115)
BUN: 21 mg/dL (ref 7–25)
CO2: 31 mmol/L (ref 20–32)
CREATININE: 1.11 mg/dL (ref 0.70–1.25)
Calcium: 10.2 mg/dL (ref 8.6–10.3)
Chloride: 96 mmol/L — ABNORMAL LOW (ref 98–110)
Globulin: 2.5 g/dL (calc) (ref 1.9–3.7)
Glucose, Bld: 304 mg/dL — ABNORMAL HIGH (ref 65–99)
POTASSIUM: 4.5 mmol/L (ref 3.5–5.3)
SODIUM: 136 mmol/L (ref 135–146)
TOTAL PROTEIN: 6.9 g/dL (ref 6.1–8.1)
Total Bilirubin: 0.7 mg/dL (ref 0.2–1.2)

## 2018-01-15 LAB — HEPATITIS C ANTIBODY
Hepatitis C Ab: NONREACTIVE
SIGNAL TO CUT-OFF: 0.01 (ref <1.00)

## 2018-01-15 LAB — LIPID PANEL
CHOL/HDL RATIO: 4.3 (calc) (ref <5.0)
CHOLESTEROL: 134 mg/dL (ref <200)
HDL: 31 mg/dL — AB (ref >40)
LDL CHOLESTEROL (CALC): 80 mg/dL
Non-HDL Cholesterol (Calc): 103 mg/dL (calc) (ref <130)
Triglycerides: 132 mg/dL (ref <150)

## 2018-01-15 LAB — TEST AUTHORIZATION

## 2018-01-15 LAB — HEMOGLOBIN A1C
EAG (MMOL/L): 16.6 (calc)
HEMOGLOBIN A1C: 12.1 %{Hb} — AB (ref <5.7)
Mean Plasma Glucose: 301 (calc)

## 2018-01-15 LAB — URIC ACID: Uric Acid, Serum: 7.4 mg/dL (ref 4.0–8.0)

## 2018-01-15 NOTE — Addendum Note (Signed)
Addended by: Harl Bowie on: 01/15/2018 09:31 AM   Modules accepted: Orders

## 2018-01-22 ENCOUNTER — Telehealth: Payer: Self-pay

## 2018-01-22 DIAGNOSIS — IMO0002 Reserved for concepts with insufficient information to code with codable children: Secondary | ICD-10-CM

## 2018-01-22 DIAGNOSIS — E1165 Type 2 diabetes mellitus with hyperglycemia: Secondary | ICD-10-CM

## 2018-01-22 DIAGNOSIS — E1151 Type 2 diabetes mellitus with diabetic peripheral angiopathy without gangrene: Secondary | ICD-10-CM

## 2018-01-22 DIAGNOSIS — E785 Hyperlipidemia, unspecified: Secondary | ICD-10-CM

## 2018-01-22 DIAGNOSIS — I10 Essential (primary) hypertension: Secondary | ICD-10-CM

## 2018-01-22 MED ORDER — EXENATIDE ER 2 MG/0.85ML ~~LOC~~ AUIJ: 2.0000 mg | AUTO-INJECTOR | SUBCUTANEOUS | 1 refills | Status: DC

## 2018-01-22 NOTE — Telephone Encounter (Signed)
Copied from [place] [id number]. Topic: General - Other >> Jan 22, 2018 12:24 PM Valla Leaver wrote: Reason for CRM: Patient's wife wants to know if the patient is supposed t start his injections tomorrow? Script she is referring to was called in today.

## 2018-01-23 NOTE — Telephone Encounter (Signed)
Pt called to state that pharmacy has not received the Rx yet; contact pharmacy if needed

## 2018-01-24 MED ORDER — SEMAGLUTIDE(0.25 OR 0.5MG/DOS) 2 MG/1.5ML ~~LOC~~ SOPN: 0.2500 mg | PEN_INJECTOR | SUBCUTANEOUS | 2 refills | Status: DC

## 2018-01-24 NOTE — Telephone Encounter (Signed)
ozempic 0.5 mg pen---- .25 mg weekly x4-- we can start with sample if they like

## 2018-01-24 NOTE — Telephone Encounter (Signed)
Spoke with wife and she stated that rx needed a prior authorization

## 2018-01-24 NOTE — Telephone Encounter (Signed)
rx sent in along with a coupon.  Wife notified.

## 2018-01-24 NOTE — Telephone Encounter (Signed)
Received fax from pharmacy and ins does not cover bydureon and request an alternative.    Called ins to get alternatives and they are Trulicity, Victoza, or Ozempic.

## 2018-01-26 NOTE — Telephone Encounter (Signed)
Pt wife called in and is now saying that they are need a PA for this med.  Pharmacy should have send over PA on the [place] [facility] [address] call back number  [phone]

## 2018-01-31 NOTE — Telephone Encounter (Signed)
PA approved via Covermymeds; KEY: [id number].

## 2018-01-31 NOTE — Telephone Encounter (Signed)
PA approved and wife notified.

## 2018-02-27 ENCOUNTER — Ambulatory Visit: Payer: BLUE CROSS/BLUE SHIELD | Admitting: Internal Medicine

## 2018-03-09 ENCOUNTER — Telehealth: Payer: Self-pay | Admitting: Family Medicine

## 2018-03-09 NOTE — Telephone Encounter (Signed)
Copied from [place] [id number]. Topic: Quick Communication - See Telephone Encounter >> Mar 09, 2018  9:37 AM Bea Graff, NT wrote: CRM for notification. See Telephone encounter for: 03/09/18. Pts wife calling and states her husband has not been using the Semaglutide (OZEMPIC) 0.25 or 0.5 MG/DOSE SOPN as he is unsure how to use the shot. Also she states he is suppose to see the endocrinologist since he has not started this medication?

## 2018-03-14 NOTE — Telephone Encounter (Signed)
Patient wife states that he has an appointment tomorrow with endo and they will ask them to show him how to use pen.

## 2018-03-15 ENCOUNTER — Encounter: Payer: Self-pay | Admitting: Internal Medicine

## 2018-03-15 ENCOUNTER — Ambulatory Visit: Payer: BLUE CROSS/BLUE SHIELD | Admitting: Internal Medicine

## 2018-03-15 VITALS — BP 110/70 | HR 80 | Ht 70.0 in | Wt 232.0 lb

## 2018-03-15 DIAGNOSIS — E1122 Type 2 diabetes mellitus with diabetic chronic kidney disease: Secondary | ICD-10-CM | POA: Diagnosis not present

## 2018-03-15 MED ORDER — SEMAGLUTIDE(0.25 OR 0.5MG/DOS) 2 MG/1.5ML ~~LOC~~ SOPN: 0.2500 mg | PEN_INJECTOR | SUBCUTANEOUS | 2 refills | Status: DC

## 2018-03-15 MED ORDER — METFORMIN HCL 500 MG PO TABS: 1000.0000 mg | ORAL_TABLET | Freq: Two times a day (BID) | ORAL | 3 refills | Status: DC

## 2018-03-15 NOTE — Progress Notes (Signed)
Patient ID: Tom Fuller, male   DOB: May 03, 1951, 67 y.o.   MRN: [id number]   HPI: Tom Fuller is a 67 y.o.-year-old male, referred by his PCP, Dr. Etter Fuller for management of DM2, dx in 2017, non-insulin-dependent, uncontrolled, with complications (mild CKD, ED).  He is here with his wife who offers part of the history especially about his medications, blood sugars, and diet.  Last hemoglobin A1c was: Lab Results  Component Value Date   HGBA1C 12.1 (H) 01/12/2018   HGBA1C 10.7 (H) 09/19/2017   HGBA1C 8.1 (H) 03/21/2016   HGBA1C 7.9 (H) 10/29/2015   Pt is on a regimen of: - Januvia 100 mg in am - Farxiga 10 mg in am PCP tried to start Ozempic(PA approved) but he decided to hold off starting until he is seen here) Has not tried Metformin.  Pt checks his sugars 1x a day and they are: - am: 214-346 - 2h after b'fast: n/c - before lunch: n/c - 2h after lunch: n/c - before dinner: n/c - 2h after dinner: n/c - bedtime: n/c - nighttime: n/c Lowest sugar was 220; he has hypoglycemia awareness at 70.  Highest sugar was 346.  Glucometer: Accu-Chek  Pt's meals are: - Breakfast: cereal with banana, 2% milk - Lunch: banana sandwich, whole wheat, 2% milk - Dinner: varies - Snacks: 2-3x Drinks OJ x2 glasses a day.  - + mild CKD, last BUN/creatinine:  Lab Results  Component Value Date   BUN 21 01/12/2018   BUN 17 12/11/2017   CREATININE 1.11 01/12/2018   CREATININE 1.05 12/11/2017  On Valsartan 320.  - + HL; last set of lipids: Lab Results  Component Value Date   CHOL 134 01/12/2018   HDL 31 (L) 01/12/2018   LDLCALC 80 01/12/2018   TRIG 132 01/12/2018   CHOLHDL 4.3 01/12/2018   - last eye exam was in 08/2017  No DR.   - no numbness and tingling in his feet.  Pt has FH of DM in PGF.  ROS: Constitutional: no weight gain/loss, no fatigue, no subjective hyperthermia/hypothermia Eyes: no blurry vision, no xerophthalmia ENT: no sore throat, no nodules palpated in throat, no  dysphagia/odynophagia, no hoarseness Cardiovascular: no CP/SOB/palpitations/leg swelling Respiratory: no cough/SOB Gastrointestinal: no N/V/D/C Musculoskeletal: no muscle/joint aches Skin: no rashes Neurological: no tremors/numbness/tingling/dizziness Psychiatric: no depression/anxiety  Past Medical History:  Diagnosis Date  . Atrial fibrillation (Tom Fuller)   . Cervical lymphadenopathy   . Erectile dysfunction   . GERD (gastroesophageal reflux disease)   . Hypertension   . Lymphoma, follicular (Tom Fuller)    Dr.Shadad   Past Surgical History:  Procedure Laterality Date  . a bsc    . ABSCESS DRAINAGE     under rt eye   . DEEP NECK LYMPH NODE BIOPSY / EXCISION  2011  . VASECTOMY     Social History   Socioeconomic History  . Marital status: Married    Spouse name: Not on file  . Number of children: 2  Occupational History  .  Saw operator  Tobacco Use  . Smoking status: Never Smoker  . Smokeless tobacco: Never Used  Substance and Sexual Activity  . Alcohol use: No  . Drug use: No  . Sexual activity: Yes    Partners: Female   Current Outpatient Medications on File Prior to Visit  Medication Sig Dispense Refill  . ACCU-CHEK FASTCLIX LANCETS MISC Use as directed once a day.  DX Code: E11.8 102 each 1  . apixaban (ELIQUIS) 5 MG  TABS tablet Take 1 tablet (5 mg total) by mouth 2 (two) times daily. 60 tablet 11  . dapagliflozin propanediol (FARXIGA) 10 MG TABS tablet Take 10 mg by mouth daily. 30 tablet 4  . doxycycline (VIBRA-TABS) 100 MG tablet Take 1 tablet (100 mg total) by mouth 2 (two) times daily. 20 tablet 0  . glucose blood (ACCU-CHEK GUIDE) test strip Use as directed once a day.  DX Code: E11.8 100 each 1  . metoprolol tartrate (LOPRESSOR) 25 MG tablet Take 1 tablet (25 mg total) by mouth 2 (two) times daily. 180 tablet 3  . Semaglutide (OZEMPIC) 0.25 or 0.5 MG/DOSE SOPN Inject 0.25 mg into the skin once a week. Patient to start at 0.25mg  weekly for 4 weeks then increase to  0.5mg  there after 1 pen 2  . valsartan-hydrochlorothiazide (DIOVAN-HCT) 320-12.5 MG tablet Take 1 tablet by mouth daily. Please keep your upcoming appointment for any future refills. Thank you. 90 tablet 1   No current facility-administered medications on file prior to visit.    Allergies  Allergen Reactions  . Ampicillin Rash   Family History  Problem Relation Age of Onset  . Hypertension Mother   . Kidney disease Mother   . Hypertension Father   . Hypertension Brother   . Diabetes Paternal Grandfather   . Hypertension Paternal Grandfather   . Kidney disease Maternal Aunt   . Kidney disease Maternal Grandmother     PE: BP 110/70   Pulse 80   Ht 5\' 10"  (1.778 m)   Wt 232 lb (105.2 kg)   SpO2 98%   BMI 33.29 kg/m  Wt Readings from Last 3 Encounters:  03/15/18 232 lb (105.2 kg)  01/12/18 236 lb 6.4 oz (107.2 kg)  11/09/17 238 lb (108 kg)   Constitutional: Obese, in NAD Eyes: PERRLA, EOMI, no exophthalmos ENT: moist mucous membranes, no thyromegaly, no cervical lymphadenopathy Cardiovascular: RRR, No MRG Respiratory: CTA B Gastrointestinal: abdomen soft, NT, ND, BS+ Musculoskeletal: no deformities, strength intact in all 4 Skin: moist, warm, no rashes Neurological: no tremor with outstretched hands, DTR normal in all 4  ASSESSMENT: 1. DM2, non-insulin-dependent, uncontrolled, with long-term complications - mild CKD - ED  PLAN:  1. Patient with long-standing, uncontrolled diabetes, on oral antidiabetic regimen, which became insufficient. Latest HbA1c was very high, at 12.1%.  At this visit, we reviewed his diet and this is very poor, with large quantities of sweet drinks during the day: Orange juice, regular sodas, and milk.  We discussed that these are greatly affecting his diabetes control and I strongly advised him to stop these right away.  We discussed about alternatives for regular sodas and milk.  I suggested a referral to nutrition but he would like to hold off  for now.  We discussed about healthier alternatives for breakfast as rice crispies cereals with 2% milk is not a breakfast conducive to good diabetes control. - We also discussed about changing his diabetes regimen.  I am confident that if he eliminates the liquid calories/carbs, he may not need insulin.  So for now, I advised him to continue [place], but start Ozempic and stop Januvia.  Also, will try to start metformin at low dose and advance as tolerated.  Benefits and side effects of the above medicines were discussed with the patient and his wife. - I suggested to:  Patient Instructions  Please continue: - Farxiga 10 mg before b'fast  Stop Januvia.  Please start Ozempic 0.25 mg weekly in a.m. (for example  on Sunday morning) x 4 weeks, then increase to 0.5 mg weekly in a.m. if no nausea or hypoglycemia.  Please start Metformin 500 mg with dinner x 4 days. If you tolerate this well, add another Metformin tablet (500 mg) with breakfast x 4 days. If you tolerate this well, add another metformin tablet with dinner (total 1000 mg) x 4 days. If you tolerate this well, add another metformin tablet with breakfast (total 1000 mg). Continue with 1000 mg of metformin 2x a day with breakfast and dinner.  STOP Orange Juice, milk, regular sodas.  Please return in 3 months with your sugar log.   Please let me know if the sugars are consistently <80 or >200.  - Strongly advised him to start checking sugars at different times of the day - check 2x a day, rotating checks - given sugar log and advised how to fill it and to bring it at next appt  - given foot care handout and explained the principles  - given instructions for hypoglycemia management "15-15 rule"  - advised for yearly eye exams  - Return to clinic in 3 mo with sugar log   Philemon Kingdom, MD PhD [facility] Endocrinology

## 2018-03-15 NOTE — Patient Instructions (Addendum)
Please continue: - Farxiga 10 mg before b'fast  Stop Januvia.  Please start Ozempic 0.25 mg weekly in a.m. (for example on Sunday morning) x 4 weeks, then increase to 0.5 mg weekly in a.m. if no nausea or hypoglycemia.  Please start Metformin 500 mg with dinner x 4 days. If you tolerate this well, add another Metformin tablet (500 mg) with breakfast x 4 days. If you tolerate this well, add another metformin tablet with dinner (total 1000 mg) x 4 days. If you tolerate this well, add another metformin tablet with breakfast (total 1000 mg). Continue with 1000 mg of metformin 2x a day with breakfast and dinner.  STOP Orange Juice, milk, regular sodas.  Please return in 3 months with your sugar log.   Please let me know if the sugars are consistently <80 or >200.  PATIENT INSTRUCTIONS FOR TYPE 2 DIABETES:  **Please join MyChart!** - see attached instructions about how to join if you have not done so already.  DIET AND EXERCISE Diet and exercise is an important part of diabetic treatment.  We recommended aerobic exercise in the form of brisk walking (working between 40-60% of maximal aerobic capacity, similar to brisk walking) for 150 minutes per week (such as 30 minutes five days per week) along with 3 times per week performing 'resistance' training (using various gauge rubber tubes with handles) 5-10 exercises involving the major muscle groups (upper body, lower body and core) performing 10-15 repetitions (or near fatigue) each exercise. Start at half the above goal but build slowly to reach the above goals. If limited by weight, joint pain, or disability, we recommend daily walking in a swimming pool with water up to waist to reduce pressure from joints while allow for adequate exercise.    BLOOD GLUCOSES Monitoring your blood glucoses is important for continued management of your diabetes. Please check your blood glucoses 2-4 times a day: fasting, before meals and at bedtime (you can rotate  these measurements - e.g. one day check before the 3 meals, the next day check before 2 of the meals and before bedtime, etc.).   HYPOGLYCEMIA (low blood sugar) Hypoglycemia is usually a reaction to not eating, exercising, or taking too much insulin/ other diabetes drugs.  Symptoms include tremors, sweating, hunger, confusion, headache, etc. Treat IMMEDIATELY with 15 grams of Carbs: . 4 glucose tablets .  cup regular juice/soda . 2 tablespoons raisins . 4 teaspoons sugar . 1 tablespoon honey Recheck blood glucose in 15 mins and repeat above if still symptomatic/blood glucose <100.  RECOMMENDATIONS TO REDUCE YOUR RISK OF DIABETIC COMPLICATIONS: * Take your prescribed MEDICATION(S) * Follow a DIABETIC diet: Complex carbs, fiber rich foods, (monounsaturated and polyunsaturated) fats * AVOID saturated/trans fats, high fat foods, >2,300 mg salt per day. * EXERCISE at least 5 times a week for 30 minutes or preferably daily.  * DO NOT SMOKE OR DRINK more than 1 drink a day. * Check your FEET every day. Do not wear tightfitting shoes. Contact us if you develop an ulcer * See your EYE doctor once a year or more if needed * Get a FLU shot once a year * Get a PNEUMONIA vaccine once before and once after age 72 years  GOALS:  * Your Hemoglobin A1c of <7%  * fasting sugars need to be <130 * after meals sugars need to be <180 (2h after you start eating) * Your Systolic BP should be 235 or lower  * Your Diastolic BP should be 80 or  lower  * Your HDL (Good Cholesterol) should be 40 or higher  * Your LDL (Bad Cholesterol) should be 100 or lower. * Your Triglycerides should be 150 or lower  * Your Urine microalbumin (kidney function) should be <30 * Your Body Mass Index should be 25 or lower    Please consider the following ways to cut down carbs and fat and increase fiber and micronutrients in your diet: - substitute whole grain for white bread or pasta - substitute brown rice for white  rice - substitute 90-calorie flat bread pieces for slices of bread when possible - substitute sweet potatoes or yams for white potatoes - substitute humus for margarine - substitute tofu for cheese when possible - substitute almond or rice milk for regular milk (would not drink soy milk daily due to concern for soy estrogen influence on breast cancer risk) - substitute dark chocolate for other sweets when possible - substitute water - can add lemon or orange slices for taste - for diet sodas (artificial sweeteners will trick your body that you can eat sweets without getting calories and will lead you to overeating and weight gain in the long run) - do not skip breakfast or other meals (this will slow down the metabolism and will result in more weight gain over time)  - can try smoothies made from fruit and almond/rice milk in am instead of regular breakfast - can also try old-fashioned (not instant) oatmeal made with almond/rice milk in am - order the dressing on the side when eating salad at a restaurant (pour less than half of the dressing on the salad) - eat as little meat as possible - can try juicing, but should not forget that juicing will get rid of the fiber, so would alternate with eating raw veg./fruits or drinking smoothies - use as little oil as possible, even when using olive oil - can dress a salad with a mix of balsamic vinegar and lemon juice, for e.g. - use agave nectar, stevia sugar, or regular sugar rather than artificial sweateners - steam or broil/roast veggies  - snack on veggies/fruit/nuts (unsalted, preferably) when possible, rather than processed foods - reduce or eliminate aspartame in diet (it is in diet sodas, chewing gum, etc) Read the labels!  Try to read Dr. Janene Harvey book: "Program for Reversing Diabetes" for other ideas for healthy eating.

## 2018-03-20 ENCOUNTER — Telehealth: Payer: Self-pay | Admitting: Internal Medicine

## 2018-03-20 NOTE — Telephone Encounter (Signed)
Spoke to patient's wife and he did start the Ozempic, so I let them know per Dr. Cruzita Lederer ok to stop metformin and see if sx resolve.

## 2018-03-20 NOTE — Telephone Encounter (Signed)
We need to figure out whether this is from metformin or Ozempic.  Did he already start Ozempic, also?  If the only new medication is metformin, let us stop this and let us start Ozempic.

## 2018-03-20 NOTE — Telephone Encounter (Signed)
Patient is having reaction to Metformin. Patient is experiencing constipation then diahrrea, pain in intestines and fatigue. Please call ph# [phone] to advise-if patient unavailable please speak with his wife [name]. Patient wants to make sure that his symptoms may or may not be related to taking Metformin.

## 2018-03-25 ENCOUNTER — Other Ambulatory Visit: Payer: Self-pay | Admitting: Family Medicine

## 2018-03-25 DIAGNOSIS — IMO0002 Reserved for concepts with insufficient information to code with codable children: Secondary | ICD-10-CM

## 2018-03-25 DIAGNOSIS — E1151 Type 2 diabetes mellitus with diabetic peripheral angiopathy without gangrene: Secondary | ICD-10-CM

## 2018-03-25 DIAGNOSIS — E1165 Type 2 diabetes mellitus with hyperglycemia: Principal | ICD-10-CM

## 2018-04-13 ENCOUNTER — Inpatient Hospital Stay: Payer: BLUE CROSS/BLUE SHIELD | Attending: Oncology | Admitting: Oncology

## 2018-04-13 ENCOUNTER — Inpatient Hospital Stay: Payer: BLUE CROSS/BLUE SHIELD

## 2018-04-13 ENCOUNTER — Telehealth: Payer: Self-pay | Admitting: Oncology

## 2018-04-13 VITALS — BP 117/79 | HR 90 | Temp 98.5°F | Resp 18 | Ht 70.0 in | Wt 227.7 lb

## 2018-04-13 DIAGNOSIS — Z7901 Long term (current) use of anticoagulants: Secondary | ICD-10-CM | POA: Diagnosis not present

## 2018-04-13 DIAGNOSIS — Z880 Allergy status to penicillin: Secondary | ICD-10-CM | POA: Diagnosis not present

## 2018-04-13 DIAGNOSIS — Z7982 Long term (current) use of aspirin: Secondary | ICD-10-CM | POA: Diagnosis not present

## 2018-04-13 DIAGNOSIS — C8280 Other types of follicular lymphoma, unspecified site: Secondary | ICD-10-CM

## 2018-04-13 DIAGNOSIS — Z7984 Long term (current) use of oral hypoglycemic drugs: Secondary | ICD-10-CM | POA: Diagnosis not present

## 2018-04-13 DIAGNOSIS — Z79899 Other long term (current) drug therapy: Secondary | ICD-10-CM | POA: Insufficient documentation

## 2018-04-13 DIAGNOSIS — Z8572 Personal history of non-Hodgkin lymphomas: Secondary | ICD-10-CM | POA: Diagnosis not present

## 2018-04-13 DIAGNOSIS — I4891 Unspecified atrial fibrillation: Secondary | ICD-10-CM | POA: Diagnosis not present

## 2018-04-13 LAB — CMP (CANCER CENTER ONLY)
ALBUMIN: 4 g/dL (ref 3.5–5.0)
ALT: 33 U/L (ref 0–44)
AST: 22 U/L (ref 15–41)
Alkaline Phosphatase: 79 U/L (ref 38–126)
Anion gap: 9 (ref 5–15)
BILIRUBIN TOTAL: 1 mg/dL (ref 0.3–1.2)
BUN: 20 mg/dL (ref 8–23)
CO2: 29 mmol/L (ref 22–32)
CREATININE: 1.21 mg/dL (ref 0.61–1.24)
Calcium: 9.7 mg/dL (ref 8.9–10.3)
Chloride: 101 mmol/L (ref 98–111)
GFR, Est AFR Am: >60 mL/min (ref >60)
GFR, Estimated: >60 mL/min (ref >60)
GLUCOSE: 177 mg/dL — AB (ref 70–99)
Potassium: 4.1 mmol/L (ref 3.5–5.1)
Sodium: 139 mmol/L (ref 135–145)
TOTAL PROTEIN: 6.9 g/dL (ref 6.5–8.1)

## 2018-04-13 LAB — CBC WITH DIFFERENTIAL (CANCER CENTER ONLY)
Abs Immature Granulocytes: 0.03 10*3/uL (ref 0.00–0.07)
BASOS ABS: 0.1 10*3/uL (ref 0.0–0.1)
Basophils Relative: 1 %
EOS PCT: 4 %
Eosinophils Absolute: 0.3 10*3/uL (ref 0.0–0.5)
HEMATOCRIT: 44.2 % (ref 39.0–52.0)
Hemoglobin: 15.2 g/dL (ref 13.0–17.0)
IMMATURE GRANULOCYTES: 0 %
LYMPHS ABS: 2.5 10*3/uL (ref 0.7–4.0)
Lymphocytes Relative: 29 %
MCH: 31.1 pg (ref 26.0–34.0)
MCHC: 34.4 g/dL (ref 30.0–36.0)
MCV: 90.6 fL (ref 80.0–100.0)
MONOS PCT: 11 %
Monocytes Absolute: 0.9 10*3/uL (ref 0.1–1.0)
NEUTROS PCT: 55 %
NRBC: 0 % (ref 0.0–0.2)
Neutro Abs: 4.9 10*3/uL (ref 1.7–7.7)
Platelet Count: 210 10*3/uL (ref 150–400)
RBC: 4.88 MIL/uL (ref 4.22–5.81)
RDW: 12.9 % (ref 11.5–15.5)
WBC Count: 8.7 10*3/uL (ref 4.0–10.5)

## 2018-04-13 NOTE — Telephone Encounter (Signed)
Gave pt avs and calendar  °

## 2018-04-13 NOTE — Progress Notes (Signed)
Hematology and Oncology Follow Up Visit  Tom Fuller [id number] 07/20/1950 67 y.o. 04/13/2018 2:50 PM    CC: [name] [name], MD  [name], [name]  [name], [name]: 67 year old man follicular lymphoma diagnosed in May 2010.  He was found to have IIIA that is asymptomatic.  Past Therapy:  He had a period of observation to be 2010 and 2013.  He is S/P Bendamustine and rituximab started on 08/25/11 for 6 cycles completed in 12/2011 and achieved complete response at this time.  Current therapy: Active surveillance.  Interim History:  [name] returns today for a repeat evaluation.  Since last visit, he continues to feel well without any issues.  He denies any recent hospitalization or illnesses.  He denies any constitutional symptoms or decline in his appetite.  He has some diet modifications and attempt losing weight and have intentionally lost weight.  His performance status and activity level remain excellent and continues to work full-time.  He denies any painful adenopathy or recent infections.  He denies any recent hospitalizations or illnesses.  He was on Glucophage that has been discontinued because of intolerance.  He denies any headaches, blurry vision or syncope.  He denies any confusion or lethargy.  He does not report any fevers, chills, sweats. He did not report any chest pain, shortness of breath or leg edema.  He does not report any cough, wheezing or hemoptysis. He does not report any nausea, or abdominal distention.  He denies any constipation or diarrhea.  He does not report any frequency urgency or hematuria.  He denies any bone pain or pathological fractures.  He denies any skin rashes or lesions.  He denies any bleeding or clotting tendency.  He denies any adenopathy or petechiae.  Denies any mood changes.  The remaining review of systems is negative.  Medications: I have reviewed the patient's current medications.  Current Outpatient  Medications  Medication Sig Dispense Refill  . ACCU-CHEK FASTCLIX LANCETS MISC Use as directed once a day.  DX Code: E11.8 102 each 1  . apixaban (ELIQUIS) 5 MG TABS tablet Take 1 tablet (5 mg total) by mouth 2 (two) times daily. 60 tablet 11  . doxycycline (VIBRA-TABS) 100 MG tablet Take 1 tablet (100 mg total) by mouth 2 (two) times daily. 20 tablet 0  . FARXIGA 10 MG TABS tablet TAKE 1 TABLET BY MOUTH DAILY 90 tablet 0  . glucose blood (ACCU-CHEK GUIDE) test strip Use as directed once a day.  DX Code: E11.8 100 each 1  . metFORMIN (GLUCOPHAGE) 500 MG tablet Take 2 tablets (1,000 mg total) by mouth 2 (two) times daily with a meal. 360 tablet 3  . metoprolol tartrate (LOPRESSOR) 25 MG tablet Take 1 tablet (25 mg total) by mouth 2 (two) times daily. 180 tablet 3  . Semaglutide,0.25 or 0.5MG /DOS, (OZEMPIC, 0.25 OR 0.5 MG/DOSE,) 2 MG/1.5ML SOPN Inject 0.25 mg into the skin once a week. Patient to start at 0.25mg  weekly for 4 weeks then increase to 0.5mg  there after 2 pen 2  . valsartan-hydrochlorothiazide (DIOVAN-HCT) 320-12.5 MG tablet Take 1 tablet by mouth daily. Please keep your upcoming appointment for any future refills. Thank you. 90 tablet 1   No current facility-administered medications for this visit.     Allergies:  Allergies  Allergen Reactions  . Ampicillin Rash    Past Medical History, Surgical history, Social history, and Family History remained unchanged.    Physical Exam: Blood pressure  117/79, pulse 90, temperature 98.5 F (36.9 C), temperature source Oral, resp. rate 18, height 5\' 10"  (1.778 m), weight 227 lb 11.2 oz (103.3 kg), SpO2 100 %.   ECOG: 0    General appearance: Comfortable appearing without any discomfort Head: Normocephalic without any trauma Oropharynx: Mucous membranes are moist and pink without any thrush or ulcers. Eyes: Pupils are equal and round reactive to light. Lymph nodes: No cervical, supraclavicular, inguinal or axillary lymphadenopathy.    Heart:regular rate and rhythm.  S1 and S2 without leg edema. Lung: Clear without any rhonchi or wheezes.  No dullness to percussion. Abdomin: Soft, nontender, nondistended with good bowel sounds.  No hepatosplenomegaly. Musculoskeletal: No joint deformity or effusion.  Full range of motion noted. Neurological: No deficits noted on motor, sensory and deep tendon reflex exam. Skin: No petechial rash or dryness.  Appeared moist.    Lab Results: Lab Results  Component Value Date   WBC 8.7 04/13/2018   HGB 15.2 04/13/2018   HCT 44.2 04/13/2018   MCV 90.6 04/13/2018   PLT 210 04/13/2018       Impression and Plan:  67 year old man with:   1.  Stage IIIa follicular lymphoma diagnosed in May 2010 after period of observation, received systemic chemotherapy in 2013 and has been in remission since that time.  His physical examination laboratory data today do not show any evidence to suggest recurrent disease.  Her CBC was personally reviewed and showed normal counts at this time.  His disease status was updated today and treatment options in the future were reviewed.  Risks and benefits of active surveillance versus treatment was reviewed today.  After discussion today we have elected to continue with active surveillance and obtain imaging studies as needed.   2. Atrial fibrillation: No recent exacerbation appears to be rate controlled.  3.  Age-appropriate cancer screening.  Remains up-to-date at this time.  4.  Follow-up: We will be in 6 months.  15  minutes was spent with the patient face-to-face today.  More than 50% of time was dedicated to patient counseling, education and coordination of his care.     [name] 11/15/20192:50 PM

## 2018-04-16 ENCOUNTER — Other Ambulatory Visit (INDEPENDENT_AMBULATORY_CARE_PROVIDER_SITE_OTHER): Payer: BLUE CROSS/BLUE SHIELD

## 2018-04-16 DIAGNOSIS — I10 Essential (primary) hypertension: Secondary | ICD-10-CM | POA: Diagnosis not present

## 2018-04-16 LAB — COMPREHENSIVE METABOLIC PANEL
ALT: 25 U/L (ref 0–53)
AST: 15 U/L (ref 0–37)
Albumin: 4.3 g/dL (ref 3.5–5.2)
Alkaline Phosphatase: 70 U/L (ref 39–117)
BILIRUBIN TOTAL: 0.6 mg/dL (ref 0.2–1.2)
BUN: 28 mg/dL — ABNORMAL HIGH (ref 6–23)
CHLORIDE: 102 meq/L (ref 96–112)
CO2: 30 mEq/L (ref 19–32)
Calcium: 10.2 mg/dL (ref 8.4–10.5)
Creatinine, Ser: 1.02 mg/dL (ref 0.40–1.50)
GFR: 77.68 mL/min (ref >60.00)
GLUCOSE: 216 mg/dL — AB (ref 70–99)
Potassium: 4.4 mEq/L (ref 3.5–5.1)
Sodium: 140 mEq/L (ref 135–145)
Total Protein: 6.8 g/dL (ref 6.0–8.3)

## 2018-05-28 ENCOUNTER — Ambulatory Visit: Payer: BLUE CROSS/BLUE SHIELD | Admitting: Family Medicine

## 2018-05-28 ENCOUNTER — Encounter: Payer: Self-pay | Admitting: Family Medicine

## 2018-05-28 VITALS — BP 110/62 | HR 74 | Temp 98.8°F | Ht 70.0 in | Wt 225.0 lb

## 2018-05-28 DIAGNOSIS — M25571 Pain in right ankle and joints of right foot: Secondary | ICD-10-CM

## 2018-05-28 MED ORDER — PREDNISONE 20 MG PO TABS: 40.0000 mg | ORAL_TABLET | Freq: Every day | ORAL | 0 refills | Status: AC

## 2018-05-28 NOTE — Progress Notes (Signed)
Pre visit review using our clinic review tool, if applicable. No additional management support is needed unless otherwise documented below in the visit note. 

## 2018-05-28 NOTE — Progress Notes (Signed)
Musculoskeletal Exam  Patient: Tom Fuller DOB: February 19, 1951  DOS: 05/28/2018  SUBJECTIVE:  Chief Complaint:   Chief Complaint  Patient presents with  . Ankle Pain    pain and swelling of right ankle    Tom Fuller is a 67 y.o.  male for evaluation and treatment of R ankle pain.   Onset:  1 day ago. No inj or change in activity.  Location: outside of ankle Character:  aching  Progression of issue:  is unchanged Associated symptoms: some swelling, pain with walking Treatment: to date has been acetaminophen and heat.   Neurovascular symptoms: no  ROS: Musculoskeletal/Extremities: +R ankle pain  Past Medical History:  Diagnosis Date  . Atrial fibrillation ([place])   . Cervical lymphadenopathy   . Erectile dysfunction   . GERD (gastroesophageal reflux disease)   . Hypertension   . Lymphoma, follicular ([facility])    Dr.Shadad    Objective: VITAL SIGNS: BP 110/62 (BP Location: Left Arm, Patient Position: Sitting, Cuff Size: Normal)   Pulse 74   Temp 98.8 F (37.1 C) (Oral)   Ht 5\' 10"  (1.778 m)   Wt 225 lb (102.1 kg)   SpO2 97%   BMI 32.28 kg/m  Constitutional: Well formed, well developed. No acute distress. Cardiovascular: Brisk cap refill Thorax & Lungs: No accessory muscle use Musculoskeletal: R ankle.   Normal active range of motion: no.   Normal passive range of motion: no Tenderness to palpation: yes over lateral msc group just posterior to lat mall; no bony ttp Deformity: no Ecchymosis: no Tests positive: none Tests negative: Anterior drawer, squeeze No prox fib head ttp Neurologic: Normal sensory function. No focal deficits noted.  Psychiatric: Normal mood. Age appropriate judgment and insight. Alert & oriented x 3.    Assessment:  Acute right ankle pain - Plan: predniSONE (DELTASONE) 20 MG tablet  Plan: Orders as above. Stretches/exercises, ice, activity as tolerated. Tylenol.  F/u prn. The patient voiced understanding and agreement to the  plan.   [place], DO 05/28/18  2:32 PM

## 2018-05-28 NOTE — Patient Instructions (Addendum)
Ice/cold pack over area for 10-15 min twice daily.  Ankle Exercises It is normal to feel mild stretching, pulling, tightness, or discomfort as you do these exercises, but you should stop right away if you feel sudden pain or your pain gets worse.  Stretching and range of motion exercises These exercises warm up your muscles and joints and improve the movement and flexibility of your ankle. These exercises also help to relieve pain, numbness, and tingling. Exercise A: Dorsiflexion/Plantar Flexion   1. Sit with your affected knee straight or bent. Do not rest your foot on anything. 2. Flex your affected ankle to tilt the top of your foot toward your shin. 3. Hold this position for 5 seconds. 4. Point your toes downward to tilt the top of your foot away from your shin. 5. Hold this position for 5 seconds. Repeat 2 times. Complete this exercise 3 times per week. Exercise B: Ankle Alphabet   1. Sit with your affected foot supported at your lower leg. ? Do not rest your foot on anything. ? Make sure your foot has room to move freely. 2. Think of your affected foot as a paintbrush, and move your foot to trace each letter of the alphabet in the air. Keep your hip and knee still while you trace. Make the letters as large as you can without increasing any discomfort. 3. Trace every letter from A to Z. Repeat 2 times. Complete this exercise 3 times per week. Strengthening exercises These exercises build strength and endurance in your ankle. Endurance is the ability to use your muscles for a long time, even after they get tired. Exercise D: Dorsiflexors   1. Secure a rubber exercise band or tube to an object, such as a table leg, that will stay still when the band is pulled. Secure the other end around your affected foot. 2. Sit on the floor, facing the object with your affected leg extended. The band or tube should be slightly tense when your foot is relaxed. 3. Slowly flex your affected ankle  and toes to bring your foot toward you. 4. Hold this position for 3 seconds.  5. Slowly return your foot to the starting position, controlling the band as you do that. Do a total of 10 repetitions. Repeat 2 times. Complete this exercise 3 times per week. Exercise E: Plantar Flexors   1. Sit on the floor with your affected leg extended. 2. Loop a rubber exercise band or tube around the ball of your affected foot. The ball of your foot is on the walking surface, right under your toes. The band or tube should be slightly tense when your foot is relaxed. 3. Slowly point your toes downward, pushing them away from you. 4. Hold this position for 3 seconds. 5. Slowly release the tension in the band or tube, controlling smoothly until your foot is back in the starting position. Repeat for a total of 10 repetitions. Repeat 2 times. Complete this exercise 3 times per week. Exercise F: Towel Curls   1. Sit in a chair on a non-carpeted surface, and put your feet on the floor. 2. Place a towel in front of your feet.  3. Keeping your heel on the floor, put your affected foot on the towel. 4. Pull the towel toward you by grabbing the towel with your toes and curling them under. Keep your heel on the floor. 5. Let your toes relax. 6. Grab the towel again. Keep going until the towel is completely underneath  your foot. Repeat for a total of 10 repetitions. Repeat 2 times. Complete this exercise 3 times per week. Exercise G: Heel Raise ( Plantar Flexors, Standing)    1. Stand with your feet shoulder-width apart. 2. Keep your weight spread evenly over the width of your feet while you rise up on your toes. Use a wall or table to steady yourself, but try not to use it for support. 3. If this exercise is too easy, try these options: ? Shift your weight toward your affected leg until you feel challenged. ? If told by your health care provider, lift your uninjured leg off the floor. 4. Hold this position  for 3 seconds. Repeat for a total of 10 repetitions. Repeat 2 times. Complete this exercise 3 times per week. Exercise H: Tandem Walking 1. Stand with one foot directly in front of the other. 2. Slowly raise your back foot up, lifting your heel before your toes, and place it directly in front of your other foot. 3. Continue to walk in this heel-to-toe way for 10 steps or for as long as told by your health care provider. Have a countertop or wall nearby to use if needed to keep your balance, but try not to hold onto anything for support. Repeat 2 times. Complete this exercises 3 times per week. Make sure you discuss any questions you have with your health care provider. Document Released: 03/30/2005 Document Revised: 01/14/2016 Document Reviewed: 02/01/2015 Elsevier Interactive Patient Education  2018 [employer].

## 2018-06-11 ENCOUNTER — Other Ambulatory Visit: Payer: Self-pay | Admitting: Family Medicine

## 2018-06-11 ENCOUNTER — Other Ambulatory Visit: Payer: Self-pay | Admitting: Internal Medicine

## 2018-06-11 DIAGNOSIS — I10 Essential (primary) hypertension: Secondary | ICD-10-CM

## 2018-06-16 ENCOUNTER — Other Ambulatory Visit: Payer: Self-pay | Admitting: Family Medicine

## 2018-06-16 DIAGNOSIS — E1165 Type 2 diabetes mellitus with hyperglycemia: Principal | ICD-10-CM

## 2018-06-16 DIAGNOSIS — E1151 Type 2 diabetes mellitus with diabetic peripheral angiopathy without gangrene: Secondary | ICD-10-CM

## 2018-06-16 DIAGNOSIS — IMO0002 Reserved for concepts with insufficient information to code with codable children: Secondary | ICD-10-CM

## 2018-07-06 ENCOUNTER — Ambulatory Visit: Payer: BLUE CROSS/BLUE SHIELD | Admitting: Internal Medicine

## 2018-07-06 ENCOUNTER — Encounter: Payer: Self-pay | Admitting: Internal Medicine

## 2018-07-06 VITALS — BP 116/80 | HR 96 | Ht 70.0 in | Wt 221.0 lb

## 2018-07-06 DIAGNOSIS — E669 Obesity, unspecified: Secondary | ICD-10-CM | POA: Diagnosis not present

## 2018-07-06 DIAGNOSIS — E785 Hyperlipidemia, unspecified: Secondary | ICD-10-CM | POA: Diagnosis not present

## 2018-07-06 DIAGNOSIS — E1122 Type 2 diabetes mellitus with diabetic chronic kidney disease: Secondary | ICD-10-CM | POA: Diagnosis not present

## 2018-07-06 LAB — POCT GLYCOSYLATED HEMOGLOBIN (HGB A1C): HEMOGLOBIN A1C: 8.5 % — AB (ref 4.0–5.6)

## 2018-07-06 MED ORDER — SEMAGLUTIDE(0.25 OR 0.5MG/DOS) 2 MG/1.5ML ~~LOC~~ SOPN: 0.2500 mg | PEN_INJECTOR | SUBCUTANEOUS | 3 refills | Status: DC

## 2018-07-06 MED ORDER — METFORMIN HCL ER 500 MG PO TB24: 1000.0000 mg | ORAL_TABLET | Freq: Every day | ORAL | 11 refills | Status: DC

## 2018-07-06 MED ORDER — SEMAGLUTIDE(0.25 OR 0.5MG/DOS) 2 MG/1.5ML ~~LOC~~ SOPN: 0.5000 mg | PEN_INJECTOR | SUBCUTANEOUS | 3 refills | Status: DC

## 2018-07-06 NOTE — Patient Instructions (Addendum)
Please continue: - Farxiga 10 mg before b'fast - Ozempic 0.5 mg weekly in am  Please try to start: - Metformin ER 500 mg with dinner x 1 week, then increase to 1000 mg with last meal of the day.  Please return in 3 months with your sugar log.

## 2018-07-06 NOTE — Addendum Note (Signed)
Addended by: Cardell Peach I on: 07/06/2018 04:44 PM   Modules accepted: Orders

## 2018-07-06 NOTE — Progress Notes (Signed)
Patient ID: Tom Fuller, male   DOB: 1950-06-11, 68 y.o.   MRN: [id number]   HPI: Tom Fuller is a 68 y.o.-year-old male, returning for follow-up for DM2, dx in 2017, non-insulin-dependent, uncontrolled, with complications (mild CKD, ED).  He is here with his wife offers part of the history especially about his medications, blood sugars, and diet.  Last hemoglobin A1c was: Lab Results  Component Value Date   HGBA1C 12.1 (H) 01/12/2018   HGBA1C 10.7 (H) 09/19/2017   HGBA1C 8.1 (H) 03/21/2016   HGBA1C 7.9 (H) 10/29/2015   Pt is on a regimen of:  - started 02/2018 >> stopped 2/2 AP - Farxiga 10 mg before b'fast - Ozempic 0.5 mg weekly in am - started 02/2018 >> has a little constipation, but not worrisome PCP tried to start Ozempic(PA approved) but he decided to hold off starting until he is seen here) Has not tried Metformin.  Pt checks his sugars 1X a day and they are: - am: 214-346 >> 147, 166-230 - 2h after b'fast: n/c - before lunch: n/c - 2h after lunch: n/c - before dinner: n/c - 2h after dinner: n/c - bedtime: n/c - nighttime: n/c Lowest sugar was 220 >> 147; he has hypoglycemia awareness in the 70s Highest sugar was 346 >> 230.  Glucometer: Accu-Chek  Pt's meals are: - Breakfast: cereal with banana - Lunch: banana sandwich, whole wheat - Dinner: varies - Snacks: 2-3x At last visit, he was drinking OJ x2 glasses a day.  I strongly advised him to stop milk and juice.  -+ Mild CKD, last BUN/creatinine:  Lab Results  Component Value Date   BUN 28 (H) 04/16/2018   BUN 20 04/13/2018   CREATININE 1.02 04/16/2018   CREATININE 1.21 04/13/2018  On valsartan 320.  -+ HL; last set of lipids: Lab Results  Component Value Date   CHOL 134 01/12/2018   HDL 31 (L) 01/12/2018   LDLCALC 80 01/12/2018   TRIG 132 01/12/2018   CHOLHDL 4.3 01/12/2018   - last eye exam was in 08/2017: No DR  - no numbness and tingling in his feet.  Pt has FH of DM in  PGF.  ROS: Constitutional: no weight gain/no weight loss, no fatigue, no subjective hyperthermia, no subjective hypothermia Eyes: no blurry vision, no xerophthalmia ENT: no sore throat, no nodules palpated in neck, no dysphagia, no odynophagia, no hoarseness Cardiovascular: no CP/no SOB/no palpitations/no leg swelling Respiratory: no cough/no SOB/no wheezing Gastrointestinal: no N/no V/no D/no C/no acid reflux Musculoskeletal: no muscle aches/no joint aches Skin: no rashes, no hair loss Neurological: no tremors/no numbness/no tingling/no dizziness  I reviewed pt's medications, allergies, PMH, social hx, family hx, and changes were documented in the history of present illness. Otherwise, unchanged from my initial visit note.  Past Medical History:  Diagnosis Date  . Atrial fibrillation ([place])   . Cervical lymphadenopathy   . Erectile dysfunction   . GERD (gastroesophageal reflux disease)   . Hypertension   . Lymphoma, follicular ([place])    Dr.Shadad   Past Surgical History:  Procedure Laterality Date  . a bsc    . ABSCESS DRAINAGE     under rt eye   . DEEP NECK LYMPH NODE BIOPSY / EXCISION  2011  . VASECTOMY     Social History   Socioeconomic History  . Marital status: Married    Spouse name: Not on file  . Number of children: 2  Occupational History  .  Saw operator  Tobacco Use  . Smoking status: Never Smoker  . Smokeless tobacco: Never Used  Substance and Sexual Activity  . Alcohol use: No  . Drug use: No  . Sexual activity: Yes    Partners: Female   Current Outpatient Medications on File Prior to Visit  Medication Sig Dispense Refill  . ACCU-CHEK FASTCLIX LANCETS MISC Use as directed once a day.  DX Code: E11.8 102 each 1  . apixaban (ELIQUIS) 5 MG TABS tablet Take 1 tablet (5 mg total) by mouth 2 (two) times daily. 60 tablet 11  . FARXIGA 10 MG TABS tablet TAKE 1 TABLET BY MOUTH EVERY DAY 90 tablet 0  . glucose blood (ACCU-CHEK GUIDE) test strip Use as directed  once a day.  DX Code: E11.8 100 each 1  . metoprolol tartrate (LOPRESSOR) 25 MG tablet Take 1 tablet (25 mg total) by mouth 2 (two) times daily. 180 tablet 3  . Semaglutide,0.25 or 0.5MG /DOS, 2 MG/1.5ML SOPN INJECT 0.25 MG INTO THE SKIN ONCE A WEEK. PATIENT TO START AT 0.25MG  WEEKLY FOR 4 WEEKS THEN INCREASE TO 0.5MG  THERE AFTER 4 pen 0  . valsartan-hydrochlorothiazide (DIOVAN-HCT) 320-12.5 MG tablet TAKE 1 DAILY. PLEASE KEEP YOUR UPCOMING APPOINTMENT FOR ANY FUTURE REFILLS. 90 tablet 1   No current facility-administered medications on file prior to visit.    Allergies  Allergen Reactions  . Ampicillin Rash   Family History  Problem Relation Age of Onset  . Hypertension Mother   . Kidney disease Mother   . Hypertension Father   . Hypertension Brother   . Diabetes Paternal Grandfather   . Hypertension Paternal Grandfather   . Kidney disease Maternal Aunt   . Kidney disease Maternal Grandmother     PE: BP 116/80   Pulse 96   Ht 5\' 10"  (1.778 m) Comment: measured  Wt 221 lb (100.2 kg)   SpO2 99%   BMI 31.71 kg/m  Wt Readings from Last 3 Encounters:  07/06/18 221 lb (100.2 kg)  05/28/18 225 lb (102.1 kg)  04/13/18 227 lb 11.2 oz (103.3 kg)   Constitutional: overweight, in NAD Eyes: PERRLA, EOMI, no exophthalmos ENT: moist mucous membranes, no thyromegaly, no cervical lymphadenopathy Cardiovascular: Tachycardia, RR, No MRG Respiratory: CTA B Gastrointestinal: abdomen soft, NT, ND, BS+ Musculoskeletal: no deformities, strength intact in all 4 Skin: moist, warm, no rashes Neurological: no tremor with outstretched hands, DTR normal in all 4  ASSESSMENT: 1. DM2, non-insulin-dependent, uncontrolled, with long-term complications - mild CKD - ED  2. HL  3.  Obesity class I  PLAN:  1. Patient with longstanding, uncontrolled, type 2 diabetes, on oral antidiabetic regimen, and now Ozempic started at last visit.  We also added metformin at last visit.  His sugars were very  high and we discussed about the importance of stopping orange juice, milk, regular sodas.  He started to eliminate these. -At this visit, sugars are much better, but he is only checking them in the morning.  Discussed about checking later in the day, also, rotating checks -Since last visit, he had to stop metformin as this was causing abdominal pain.  I suggested to retry a low-dose metformin ER.  He agrees to do so.  We will start with 1 tablet with dinner and increase to 2 tablets with dinner, if tolerated.  This will help improve his a.m. sugars. -I would be tempted to increase his Ozempic, however, since he has some abdominal pain/constipation/borborygmi, we will continue with the same dose for now. -Given coupon  card for Ozempic and Farxiga - I suggested to:  Patient Instructions  Please continue: - Farxiga 10 mg before b'fast - Ozempic 0.5 mg weekly in am  Please try to start: - Metformin ER 500 mg with dinner x 1 week, then increase to 1000 mg with last meal of the day.  Please return in 3 months with your sugar log.   - today, HbA1c is 8.5% (MUCH better) - advised for yearly eye exams >> he is UTD - Return to clinic in 3 mo with sugar log   2. HL - Reviewed latest lipid panel from 12/2017 Lab Results  Component Value Date   CHOL 134 01/12/2018   HDL 31 (L) 01/12/2018   LDLCALC 80 01/12/2018   TRIG 132 01/12/2018   CHOLHDL 4.3 01/12/2018  -He is not on a statin.  3.  Obesity class I -Continue SGLT 2 inhibitor and GLP-1 receptor agonist.   - He lost 6 pounds since last visit despite the holidays. -We will also retry to start metformin, which can reduce his appetite further.   Philemon Kingdom, MD PhD [facility]

## 2018-07-06 NOTE — Addendum Note (Signed)
Addended by: Cardell Peach I on: 07/06/2018 03:26 PM   Modules accepted: Orders

## 2018-07-20 ENCOUNTER — Ambulatory Visit: Payer: BLUE CROSS/BLUE SHIELD | Admitting: Family Medicine

## 2018-07-20 ENCOUNTER — Encounter: Payer: Self-pay | Admitting: Family Medicine

## 2018-07-20 VITALS — BP 108/72 | HR 95 | Resp 12 | Ht 70.0 in | Wt 224.0 lb

## 2018-07-20 DIAGNOSIS — E785 Hyperlipidemia, unspecified: Secondary | ICD-10-CM

## 2018-07-20 DIAGNOSIS — I10 Essential (primary) hypertension: Secondary | ICD-10-CM

## 2018-07-20 DIAGNOSIS — Z23 Encounter for immunization: Secondary | ICD-10-CM

## 2018-07-20 MED ORDER — PNEUMOCOCCAL 13-VAL CONJ VACC IM SUSP
0.5000 mL | INTRAMUSCULAR | Status: AC
  Administered 2018-07-20: 0.5 mL via INTRAMUSCULAR

## 2018-07-20 NOTE — Assessment & Plan Note (Signed)
>>  ASSESSMENT AND PLAN FOR ESSENTIAL HYPERTENSION WRITTEN ON 07/20/2018  8:47 PM BY LOWNE CHASE, YVONNE R, DO  Well controlled, no changes to meds. Encouraged heart healthy diet such as the DASH diet and exercise as tolerated.

## 2018-07-20 NOTE — Progress Notes (Signed)
Patient ID: Tom Fuller, male    DOB: 04/25/1951  Age: 68 y.o. MRN: [id number]    Subjective:  Subjective  HPI Tom Fuller presents for f/u htn and cholesterol   No complaints     Review of Systems  Constitutional: Negative for appetite change, diaphoresis, fatigue and unexpected weight change.  Eyes: Negative for pain, redness and visual disturbance.  Respiratory: Negative for cough, chest tightness, shortness of breath and wheezing.   Cardiovascular: Negative for chest pain, palpitations and leg swelling.  Endocrine: Negative for cold intolerance, heat intolerance, polydipsia, polyphagia and polyuria.  Genitourinary: Negative for difficulty urinating, dysuria and frequency.  Neurological: Negative for dizziness, light-headedness, numbness and headaches.    History Past Medical History:  Diagnosis Date  . Atrial fibrillation ([place])   . Cervical lymphadenopathy   . Erectile dysfunction   . GERD (gastroesophageal reflux disease)   . Hypertension   . Lymphoma, follicular ([place])    Dr.Shadad    He has a past surgical history that includes Vasectomy; a bsc; Abscess drainage; and Deep neck lymph node biopsy / excision (2011).   His family history includes Diabetes in his paternal grandfather; Hypertension in his brother, father, mother, and paternal grandfather; Kidney disease in his maternal aunt, maternal grandmother, and mother.He reports that he has never smoked. He has never used smokeless tobacco. He reports that he does not drink alcohol or use drugs.  Current Outpatient Medications on File Prior to Visit  Medication Sig Dispense Refill  . ACCU-CHEK FASTCLIX LANCETS MISC Use as directed once a day.  DX Code: E11.8 102 each 1  . apixaban (ELIQUIS) 5 MG TABS tablet Take 1 tablet (5 mg total) by mouth 2 (two) times daily. 60 tablet 11  . FARXIGA 10 MG TABS tablet TAKE 1 TABLET BY MOUTH EVERY DAY 90 tablet 0  . glucose blood (ACCU-CHEK GUIDE) test strip Use as directed once a  day.  DX Code: E11.8 100 each 1  . metFORMIN (GLUCOPHAGE-XR) 500 MG 24 hr tablet Take 2 tablets (1,000 mg total) by mouth daily with breakfast. 60 tablet 11  . metoprolol tartrate (LOPRESSOR) 25 MG tablet Take 1 tablet (25 mg total) by mouth 2 (two) times daily. 180 tablet 3  . Semaglutide,0.25 or 0.5MG /DOS, (OZEMPIC, 0.25 OR 0.5 MG/DOSE,) 2 MG/1.5ML SOPN Inject 0.5 mg into the skin once a week. 3 pen 3  . valsartan-hydrochlorothiazide (DIOVAN-HCT) 320-12.5 MG tablet TAKE 1 DAILY. PLEASE KEEP YOUR UPCOMING APPOINTMENT FOR ANY FUTURE REFILLS. 90 tablet 1   No current facility-administered medications on file prior to visit.      Objective:  Objective  Physical Exam Vitals signs and nursing note reviewed.  Constitutional:      General: He is sleeping.     Appearance: He is well-developed.  HENT:     Head: Normocephalic and atraumatic.  Eyes:     Pupils: Pupils are equal, round, and reactive to light.  Neck:     Musculoskeletal: Normal range of motion and neck supple.     Thyroid: No thyromegaly.  Cardiovascular:     Rate and Rhythm: Normal rate and regular rhythm.     Heart sounds: No murmur.  Pulmonary:     Effort: Pulmonary effort is normal. No respiratory distress.     Breath sounds: Normal breath sounds. No wheezing or rales.  Chest:     Chest wall: No tenderness.  Musculoskeletal:        General: No tenderness.  Skin:  General: Skin is warm and dry.  Neurological:     Mental Status: He is oriented to person, place, and time.  Psychiatric:        Behavior: Behavior normal.        Thought Content: Thought content normal.        Judgment: Judgment normal.    BP 108/72   Pulse 95   Resp 12   Ht 5\' 10"  (1.778 m)   Wt 224 lb (101.6 kg)   SpO2 96%   BMI 32.14 kg/m  Wt Readings from Last 3 Encounters:  07/20/18 224 lb (101.6 kg)  07/06/18 221 lb (100.2 kg)  05/28/18 225 lb (102.1 kg)     Lab Results  Component Value Date   WBC 8.7 04/13/2018   HGB 15.2  04/13/2018   HCT 44.2 04/13/2018   PLT 210 04/13/2018   GLUCOSE 216 (H) 04/16/2018   CHOL 134 01/12/2018   TRIG 132 01/12/2018   HDL 31 (L) 01/12/2018   LDLCALC 80 01/12/2018   ALT 25 04/16/2018   AST 15 04/16/2018   NA 140 04/16/2018   K 4.4 04/16/2018   CL 102 04/16/2018   CREATININE 1.02 04/16/2018   BUN 28 (H) 04/16/2018   CO2 30 04/16/2018   TSH 1.69 09/23/2010   PSA 0.32 09/23/2010   INR 1.07 02/13/2014   HGBA1C 8.5 (A) 07/06/2018   MICROALBUR 16.4 (H) 10/29/2015    Dg Elbow Complete Right  Result Date: 01/15/2018 CLINICAL DATA:  Swelling and redness of the posterior aspect of the right elbow for 2 weeks. EXAM: RIGHT ELBOW - COMPLETE 3+ VIEW COMPARISON:  None. FINDINGS: There is soft tissue swelling in the region of the olecranon bursa. There is no fracture or joint effusion. Slight arthritic changes of the elbow joint. Calcific tendinopathy at the origins of common flexor and common extensor tendons at the epicondyles of the distal humerus. IMPRESSION: 1. Probable olecranon bursitis. 2. No acute abnormality of the right elbow joint. Electronically Signed   By: Lorriane Shire [name]   On: 01/15/2018 07:57     Assessment & Plan:  Plan  I am having Tom Fuller maintain his ACCU-CHEK FASTCLIX LANCETS, glucose blood, metoprolol tartrate, apixaban, valsartan-hydrochlorothiazide, FARXIGA, metFORMIN, and Semaglutide(0.25 or 0.5MG /DOS). We administered pneumococcal 13-valent conjugate vaccine.  Meds ordered this encounter  Medications  . pneumococcal 13-valent conjugate vaccine (PREVNAR 13) injection 0.5 mL    Problem List Items Addressed This Visit      Unprioritized   Essential hypertension - Primary    Well controlled, no changes to meds. Encouraged heart healthy diet such as the DASH diet and exercise as tolerated.       Relevant Orders   CBC with Differential/Platelet   Lipid panel   Comprehensive metabolic panel   Hyperlipidemia LDL goal <70    Tolerating statin,  encouraged heart healthy diet, avoid trans fats, minimize simple carbs and saturated fats. Increase exercise as tolerated       Other Visit Diagnoses    Hyperlipidemia, unspecified hyperlipidemia type       Relevant Orders   CBC with Differential/Platelet   Lipid panel   Comprehensive metabolic panel   Need for pneumococcal vaccination       Relevant Medications   pneumococcal 13-valent conjugate vaccine (PREVNAR 13) injection 0.5 mL (Completed) (Start on 07/21/2018 10:00 AM)      Follow-up: Return in about 6 months (around 01/18/2019), or if symptoms worsen or fail to improve, for annual exam, fasting.  Ann Held, DO

## 2018-07-20 NOTE — Patient Instructions (Signed)
DASH Eating Plan  DASH stands for "Dietary Approaches to Stop Hypertension." The DASH eating plan is a healthy eating plan that has been shown to reduce high blood pressure (hypertension). It may also reduce your risk for type 2 diabetes, heart disease, and stroke. The DASH eating plan may also help with weight loss.  What are tips for following this plan?    General guidelines   Avoid eating more than 2,300 mg (milligrams) of salt (sodium) a day. If you have hypertension, you may need to reduce your sodium intake to 1,500 mg a day.   Limit alcohol intake to no more than 1 drink a day for nonpregnant women and 2 drinks a day for men. One drink equals 12 oz of beer, 5 oz of wine, or 1 oz of hard liquor.   Work with your health care provider to maintain a healthy body weight or to lose weight. Ask what an ideal weight is for you.   Get at least 30 minutes of exercise that causes your heart to beat faster (aerobic exercise) most days of the week. Activities may include walking, swimming, or biking.   Work with your health care provider or diet and nutrition specialist (dietitian) to adjust your eating plan to your individual calorie needs.  Reading food labels     Check food labels for the amount of sodium per serving. Choose foods with less than 5 percent of the Daily Value of sodium. Generally, foods with less than 300 mg of sodium per serving fit into this eating plan.   To find whole grains, look for the word "whole" as the first word in the ingredient list.  Shopping   Buy products labeled as "low-sodium" or "no salt added."   Buy fresh foods. Avoid canned foods and premade or frozen meals.  Cooking   Avoid adding salt when cooking. Use salt-free seasonings or herbs instead of table salt or sea salt. Check with your health care provider or pharmacist before using salt substitutes.   Do not fry foods. Cook foods using healthy methods such as baking, boiling, grilling, and broiling instead.   Cook with  heart-healthy oils, such as olive, canola, soybean, or sunflower oil.  Meal planning   Eat a balanced diet that includes:  ? 5 or more servings of fruits and vegetables each day. At each meal, try to fill half of your plate with fruits and vegetables.  ? Up to 6-8 servings of whole grains each day.  ? Less than 6 oz of lean meat, poultry, or fish each day. A 3-oz serving of meat is about the same size as a deck of cards. One egg equals 1 oz.  ? 2 servings of low-fat dairy each day.  ? A serving of nuts, seeds, or beans 5 times each week.  ? Heart-healthy fats. Healthy fats called Omega-3 fatty acids are found in foods such as flaxseeds and coldwater fish, like sardines, salmon, and mackerel.   Limit how much you eat of the following:  ? Canned or prepackaged foods.  ? Food that is high in trans fat, such as fried foods.  ? Food that is high in saturated fat, such as fatty meat.  ? Sweets, desserts, sugary drinks, and other foods with added sugar.  ? Full-fat dairy products.   Do not salt foods before eating.   Try to eat at least 2 vegetarian meals each week.   Eat more home-cooked food and less restaurant, buffet, and fast food.     When eating at a restaurant, ask that your food be prepared with less salt or no salt, if possible.  What foods are recommended?  The items listed may not be a complete list. Talk with your dietitian about what dietary choices are best for you.  Grains  Whole-grain or whole-wheat bread. Whole-grain or whole-wheat pasta. Brown rice. Oatmeal. Quinoa. Bulgur. Whole-grain and low-sodium cereals. Pita bread. Low-fat, low-sodium crackers. Whole-wheat flour tortillas.  Vegetables  Fresh or frozen vegetables (raw, steamed, roasted, or grilled). Low-sodium or reduced-sodium tomato and vegetable juice. Low-sodium or reduced-sodium tomato sauce and tomato paste. Low-sodium or reduced-sodium canned vegetables.  Fruits  All fresh, dried, or frozen fruit. Canned fruit in natural juice (without  added sugar).  Meat and other protein foods  Skinless chicken or turkey. Ground chicken or turkey. Pork with fat trimmed off. Fish and seafood. Egg whites. Dried beans, peas, or lentils. Unsalted nuts, nut butters, and seeds. Unsalted canned beans. Lean cuts of beef with fat trimmed off. Low-sodium, lean deli meat.  Dairy  Low-fat (1%) or fat-free (skim) milk. Fat-free, low-fat, or reduced-fat cheeses. Nonfat, low-sodium ricotta or cottage cheese. Low-fat or nonfat yogurt. Low-fat, low-sodium cheese.  Fats and oils  Soft margarine without trans fats. Vegetable oil. Low-fat, reduced-fat, or light mayonnaise and salad dressings (reduced-sodium). Canola, safflower, olive, soybean, and sunflower oils. Avocado.  Seasoning and other foods  Herbs. Spices. Seasoning mixes without salt. Unsalted popcorn and pretzels. Fat-free sweets.  What foods are not recommended?  The items listed may not be a complete list. Talk with your dietitian about what dietary choices are best for you.  Grains  Baked goods made with fat, such as croissants, muffins, or some breads. Dry pasta or rice meal packs.  Vegetables  Creamed or fried vegetables. Vegetables in a cheese sauce. Regular canned vegetables (not low-sodium or reduced-sodium). Regular canned tomato sauce and paste (not low-sodium or reduced-sodium). Regular tomato and vegetable juice (not low-sodium or reduced-sodium). Pickles. Olives.  Fruits  Canned fruit in a light or heavy syrup. Fried fruit. Fruit in cream or butter sauce.  Meat and other protein foods  Fatty cuts of meat. Ribs. Fried meat. Bacon. Sausage. Bologna and other processed lunch meats. Salami. Fatback. Hotdogs. Bratwurst. Salted nuts and seeds. Canned beans with added salt. Canned or smoked fish. Whole eggs or egg yolks. Chicken or turkey with skin.  Dairy  Whole or 2% milk, cream, and half-and-half. Whole or full-fat cream cheese. Whole-fat or sweetened yogurt. Full-fat cheese. Nondairy creamers. Whipped toppings.  Processed cheese and cheese spreads.  Fats and oils  Butter. Stick margarine. Lard. Shortening. Ghee. Bacon fat. Tropical oils, such as coconut, palm kernel, or palm oil.  Seasoning and other foods  Salted popcorn and pretzels. Onion salt, garlic salt, seasoned salt, table salt, and sea salt. Worcestershire sauce. Tartar sauce. Barbecue sauce. Teriyaki sauce. Soy sauce, including reduced-sodium. Steak sauce. Canned and packaged gravies. Fish sauce. Oyster sauce. Cocktail sauce. Horseradish that you find on the shelf. Ketchup. Mustard. Meat flavorings and tenderizers. Bouillon cubes. Hot sauce and Tabasco sauce. Premade or packaged marinades. Premade or packaged taco seasonings. Relishes. Regular salad dressings.  Where to find more information:   National Heart, Lung, and Blood Institute: www.nhlbi.nih.gov   American Heart Association: www.heart.org  Summary   The DASH eating plan is a healthy eating plan that has been shown to reduce high blood pressure (hypertension). It may also reduce your risk for type 2 diabetes, heart disease, and stroke.   With the   DASH eating plan, you should limit salt (sodium) intake to 2,300 mg a day. If you have hypertension, you may need to reduce your sodium intake to 1,500 mg a day.   When on the DASH eating plan, aim to eat more fresh fruits and vegetables, whole grains, lean proteins, low-fat dairy, and heart-healthy fats.   Work with your health care provider or diet and nutrition specialist (dietitian) to adjust your eating plan to your individual calorie needs.  This information is not intended to replace advice given to you by your health care provider. Make sure you discuss any questions you have with your health care provider.  Document Released: 05/05/2011 Document Revised: 05/09/2016 Document Reviewed: 05/09/2016  Elsevier Interactive Patient Education  2019 [employer]

## 2018-07-20 NOTE — Assessment & Plan Note (Signed)
>>  ASSESSMENT AND PLAN FOR HYPERLIPIDEMIA LDL GOAL <70 WRITTEN ON 07/20/2018  8:48 PM BY LOWNE CHASE, YVONNE R, DO  Tolerating statin, encouraged heart healthy diet, avoid trans fats, minimize simple carbs and saturated fats. Increase exercise as tolerated

## 2018-07-20 NOTE — Assessment & Plan Note (Signed)
Tolerating statin, encouraged heart healthy diet, avoid trans fats, minimize simple carbs and saturated fats. Increase exercise as tolerated 

## 2018-07-20 NOTE — Assessment & Plan Note (Signed)
Well controlled, no changes to meds. Encouraged heart healthy diet such as the DASH diet and exercise as tolerated.  °

## 2018-08-14 ENCOUNTER — Other Ambulatory Visit: Payer: Self-pay | Admitting: Internal Medicine

## 2018-09-14 ENCOUNTER — Other Ambulatory Visit: Payer: Self-pay | Admitting: Family Medicine

## 2018-09-14 DIAGNOSIS — E1165 Type 2 diabetes mellitus with hyperglycemia: Principal | ICD-10-CM

## 2018-09-14 DIAGNOSIS — IMO0002 Reserved for concepts with insufficient information to code with codable children: Secondary | ICD-10-CM

## 2018-09-14 DIAGNOSIS — E1151 Type 2 diabetes mellitus with diabetic peripheral angiopathy without gangrene: Secondary | ICD-10-CM

## 2018-09-18 ENCOUNTER — Telehealth: Payer: Self-pay | Admitting: *Deleted

## 2018-09-18 NOTE — Telephone Encounter (Signed)
Per 4/20 los, rescheduled appt from 5/14 to 8/14. Called and gave the wife the appts

## 2018-09-19 ENCOUNTER — Other Ambulatory Visit: Payer: Self-pay | Admitting: Family Medicine

## 2018-10-11 ENCOUNTER — Ambulatory Visit: Payer: BLUE CROSS/BLUE SHIELD | Admitting: Oncology

## 2018-10-11 ENCOUNTER — Other Ambulatory Visit: Payer: BLUE CROSS/BLUE SHIELD

## 2018-10-19 ENCOUNTER — Ambulatory Visit: Payer: BLUE CROSS/BLUE SHIELD | Admitting: Internal Medicine

## 2018-10-30 ENCOUNTER — Other Ambulatory Visit: Payer: Self-pay | Admitting: Family Medicine

## 2018-10-31 ENCOUNTER — Telehealth: Payer: Self-pay | Admitting: Internal Medicine

## 2018-10-31 NOTE — Telephone Encounter (Signed)
New message    [facility] for pt to call back about appt on 06.16.20 with Dr. [name]. Will offer pt virtual visit through doxemity. Video if pt has a smart phone, phone call if pt does not have a smart phone. If pt calls back, please reach out via secure chat. I will speak to pt.

## 2018-11-13 ENCOUNTER — Other Ambulatory Visit: Payer: Self-pay

## 2018-11-13 ENCOUNTER — Telehealth (INDEPENDENT_AMBULATORY_CARE_PROVIDER_SITE_OTHER): Payer: BC Managed Care – PPO | Admitting: Internal Medicine

## 2018-11-13 DIAGNOSIS — I10 Essential (primary) hypertension: Secondary | ICD-10-CM

## 2018-11-13 DIAGNOSIS — I480 Paroxysmal atrial fibrillation: Secondary | ICD-10-CM

## 2018-11-13 MED ORDER — METOPROLOL TARTRATE 25 MG PO TABS: 25.0000 mg | ORAL_TABLET | Freq: Two times a day (BID) | ORAL | 3 refills | Status: DC

## 2018-11-13 MED ORDER — APIXABAN 5 MG PO TABS: 5.0000 mg | ORAL_TABLET | Freq: Two times a day (BID) | ORAL | 3 refills | Status: DC

## 2018-11-13 NOTE — Progress Notes (Signed)
Electrophysiology TeleHealth Note   Due to national recommendations of social distancing due to COVID 19, an audio/video telehealth visit is felt to be most appropriate for this patient at this time.  See MyChart message from today for the patient's consent to telehealth for [facility].   Date:  11/13/2018   ID:  Tom Fuller, DOB [birth year], MRN [id number]  Location: patient's home  Provider location: [address] [place]  Evaluation Performed: Follow-up visit  PCP:  Ann Held, DO  Cardiologist:  No primary care provider on file.  Electrophysiologist:  Dr [name]  Chief Complaint:  " I've been doing alright. "  History of Present Illness:    Tom Fuller is a 68 y.o. male who presents via audio/video conferencing for a telehealth visit today.  He has a h/o chronic atrial fib, HTN, and overweight. He also has diastolic heart failure which has been class 2. Since last being seen in our clinic, the patient reports doing very well.  Today, he denies symptoms of palpitations, chest pain, shortness of breath,  lower extremity edema, dizziness, presyncope, or syncope.  The patient is otherwise without complaint today.  The patient denies symptoms of fevers, chills, cough, or new SOB worrisome for COVID 19.  Past Medical History:  Diagnosis Date  . Atrial fibrillation ([place])   . Cervical lymphadenopathy   . Erectile dysfunction   . GERD (gastroesophageal reflux disease)   . Hypertension   . Lymphoma, follicular ([place])    Dr.Shadad    Past Surgical History:  Procedure Laterality Date  . a bsc    . ABSCESS DRAINAGE     under rt eye   . DEEP NECK LYMPH NODE BIOPSY / EXCISION  2011  . VASECTOMY      Current Outpatient Medications  Medication Sig Dispense Refill  . Accu-Chek FastClix Lancets MISC USE AS DIRECTED ONCE A DAY. DX CODE: E11.8 102 each 1  . ACCU-CHEK GUIDE test strip USE AS DIRECTED ONCE A DAY. DX CODE: E11.8 100 each 1  . apixaban  (ELIQUIS) 5 MG TABS tablet Take 1 tablet (5 mg total) by mouth 2 (two) times daily. 60 tablet 11  . FARXIGA 10 MG TABS tablet TAKE 1 TABLET BY MOUTH EVERY DAY 90 tablet 1  . metFORMIN (GLUCOPHAGE-XR) 500 MG 24 hr tablet Take 2 tablets (1,000 mg total) by mouth daily with breakfast. 60 tablet 11  . metoprolol tartrate (LOPRESSOR) 25 MG tablet Take 1 tablet (25 mg total) by mouth 2 (two) times daily. Please keep upcoming appt for future refills. Thank you 180 tablet 0  . Semaglutide,0.25 or 0.5MG /DOS, (OZEMPIC, 0.25 OR 0.5 MG/DOSE,) 2 MG/1.5ML SOPN Inject 0.5 mg into the skin once a week. 3 pen 3  . valsartan-hydrochlorothiazide (DIOVAN-HCT) 320-12.5 MG tablet TAKE 1 DAILY. PLEASE KEEP YOUR UPCOMING APPOINTMENT FOR ANY FUTURE REFILLS. 90 tablet 1   No current facility-administered medications for this visit.     Allergies:   Ampicillin   Social History:  The patient  reports that he has never smoked. He has never used smokeless tobacco. He reports that he does not drink alcohol or use drugs.   Family History:  The patient's  family history includes Diabetes in his paternal grandfather; Hypertension in his brother, father, mother, and paternal grandfather; Kidney disease in his maternal aunt, maternal grandmother, and mother.   ROS:  Please see the history of present illness.   All other systems are personally reviewed  and negative.    Exam:    Vital Signs:  BP - 125/74, P - 71, Wt. 221 lbs  Well appearing, alert and conversant, regular work of breathing,  good skin color Eyes- anicteric, neuro- grossly intact, skin- no apparent rash or lesions or cyanosis, mouth- oral mucosa is pink   Labs/Other Tests and Data Reviewed:    Recent Labs: 04/13/2018: Hemoglobin 15.2; Platelet Count 210 04/16/2018: ALT 25; BUN 28; Creatinine, Ser 1.02; Potassium 4.4; Sodium 140   Wt Readings from Last 3 Encounters:  07/20/18 224 lb (101.6 kg)  07/06/18 221 lb (100.2 kg)  05/28/18 225 lb (102.1 kg)      Other studies personally reviewed: Additional studies/ records that were reviewed today include:    ASSESSMENT & PLAN:    1.  Persistent atrial fib - he is asymptomatic and his VR is well controlled.  2. HTN - his pressures have been low/normal. He will continue his current meds. 3. Chronic diastolic heart failure - I encouraged the patient to avoid salty foods. He will maintain a low sodium diet. 4. COVID 19 screen The patient denies symptoms of COVID 19 at this time.  The importance of social distancing was discussed today.  Follow-up:  12 months Next remote: n/a  Current medicines are reviewed at length with the patient today.   The patient does not have concerns regarding his medicines.  The following changes were made today:  none  Labs/ tests ordered today include: none No orders of the defined types were placed in this encounter.    Patient Risk:  after full review of this patients clinical status, I feel that they are at moderate risk at this time.  Today, I have spent 25 minutes with the patient with telehealth technology discussing all of the above .    Signed, Cristopher Peru, MD  11/13/2018 11:46 AM     [address] [place] [address] [phone] (office) [phone] (fax)

## 2018-11-13 NOTE — Addendum Note (Signed)
Addended by: Willeen Cass A on: 11/13/2018 12:03 PM   Modules accepted: Orders

## 2018-12-18 ENCOUNTER — Other Ambulatory Visit: Payer: Self-pay | Admitting: Family Medicine

## 2018-12-18 DIAGNOSIS — I10 Essential (primary) hypertension: Secondary | ICD-10-CM

## 2019-01-11 ENCOUNTER — Telehealth: Payer: Self-pay | Admitting: Oncology

## 2019-01-11 ENCOUNTER — Inpatient Hospital Stay: Payer: BC Managed Care – PPO | Attending: Oncology | Admitting: Oncology

## 2019-01-11 ENCOUNTER — Inpatient Hospital Stay: Payer: BC Managed Care – PPO

## 2019-01-11 ENCOUNTER — Other Ambulatory Visit: Payer: Self-pay

## 2019-01-11 VITALS — BP 119/87 | HR 82 | Temp 97.8°F | Resp 17 | Wt 224.1 lb

## 2019-01-11 DIAGNOSIS — C8290 Follicular lymphoma, unspecified, unspecified site: Secondary | ICD-10-CM | POA: Insufficient documentation

## 2019-01-11 DIAGNOSIS — I4891 Unspecified atrial fibrillation: Secondary | ICD-10-CM | POA: Insufficient documentation

## 2019-01-11 DIAGNOSIS — Z7901 Long term (current) use of anticoagulants: Secondary | ICD-10-CM | POA: Diagnosis not present

## 2019-01-11 DIAGNOSIS — Z79899 Other long term (current) drug therapy: Secondary | ICD-10-CM | POA: Diagnosis not present

## 2019-01-11 DIAGNOSIS — Z7984 Long term (current) use of oral hypoglycemic drugs: Secondary | ICD-10-CM | POA: Insufficient documentation

## 2019-01-11 DIAGNOSIS — C8280 Other types of follicular lymphoma, unspecified site: Secondary | ICD-10-CM | POA: Diagnosis not present

## 2019-01-11 LAB — CBC WITH DIFFERENTIAL (CANCER CENTER ONLY)
Abs Immature Granulocytes: 0.01 10*3/uL (ref 0.00–0.07)
Basophils Absolute: 0.1 10*3/uL (ref 0.0–0.1)
Basophils Relative: 1 %
Eosinophils Absolute: 0.5 10*3/uL (ref 0.0–0.5)
Eosinophils Relative: 7 %
HCT: 47.1 % (ref 39.0–52.0)
Hemoglobin: 16.6 g/dL (ref 13.0–17.0)
Immature Granulocytes: 0 %
Lymphocytes Relative: 29 %
Lymphs Abs: 2.2 10*3/uL (ref 0.7–4.0)
MCH: 32 pg (ref 26.0–34.0)
MCHC: 35.2 g/dL (ref 30.0–36.0)
MCV: 90.8 fL (ref 80.0–100.0)
Monocytes Absolute: 0.9 10*3/uL (ref 0.1–1.0)
Monocytes Relative: 11 %
Neutro Abs: 4 10*3/uL (ref 1.7–7.7)
Neutrophils Relative %: 52 %
Platelet Count: 205 10*3/uL (ref 150–400)
RBC: 5.19 MIL/uL (ref 4.22–5.81)
RDW: 13.2 % (ref 11.5–15.5)
WBC Count: 7.6 10*3/uL (ref 4.0–10.5)
nRBC: 0 % (ref 0.0–0.2)

## 2019-01-11 LAB — CMP (CANCER CENTER ONLY)
ALT: 30 U/L (ref 0–44)
AST: 22 U/L (ref 15–41)
Albumin: 4.3 g/dL (ref 3.5–5.0)
Alkaline Phosphatase: 71 U/L (ref 38–126)
Anion gap: 9 (ref 5–15)
BUN: 22 mg/dL (ref 8–23)
CO2: 27 mmol/L (ref 22–32)
Calcium: 10.2 mg/dL (ref 8.9–10.3)
Chloride: 103 mmol/L (ref 98–111)
Creatinine: 1.07 mg/dL (ref 0.61–1.24)
GFR, Est AFR Am: >60 mL/min (ref >60)
GFR, Estimated: >60 mL/min (ref >60)
Glucose, Bld: 149 mg/dL — ABNORMAL HIGH (ref 70–99)
Potassium: 4 mmol/L (ref 3.5–5.1)
Sodium: 139 mmol/L (ref 135–145)
Total Bilirubin: 0.8 mg/dL (ref 0.3–1.2)
Total Protein: 6.9 g/dL (ref 6.5–8.1)

## 2019-01-11 NOTE — Progress Notes (Signed)
Hematology and Oncology Follow Up Visit  Tom Fuller [id number] May 13, 1951 68 y.o. 01/11/2019 11:36 AM    CC: Tom Fuller. Tom Le, MD  Tom Fuller, [name]  Tom Chessman, [name]: 68 year old man with stage IIIA follicular lymphoma diagnosed in May 2010.   Past Therapy:  He had a period of observation to be 2010 and 2013.  He is S/P Bendamustine and rituximab started on 08/25/11 for 6 cycles completed in 12/2011 and achieved complete response at this time.  Current therapy: Active surveillance.  Interim History:  Tom Fuller is here for a follow-up.  Since last visit, he reports no major changes in his health.  He continues to feel well without any recent hospitalizations or illnesses.  He denies lymphadenopathy, petechia or bruising.  He denies any constitutional symptoms.  His appetite and performance status is unchanged.   Patient denied any alteration mental status, neuropathy, confusion or dizziness.  Denies any headaches or lethargy.  Denies any night sweats, weight loss or changes in appetite.  Denied orthopnea, dyspnea on exertion or chest discomfort.  Denies shortness of breath, difficulty breathing hemoptysis or cough.  Denies any abdominal distention, nausea, early satiety or dyspepsia.  Denies any hematuria, frequency, dysuria or nocturia.  Denies any skin irritation, dryness or rash.  Denies any ecchymosis or petechiae.  Denies any lymphadenopathy or clotting.  Denies any heat or cold intolerance.  Denies any anxiety or depression.  Remaining review of system is negative.     Medications: Updated after review today.  Current Outpatient Medications  Medication Sig Dispense Refill  . Accu-Chek FastClix Lancets MISC USE AS DIRECTED ONCE A DAY. DX CODE: E11.8 102 each 1  . ACCU-CHEK GUIDE test strip USE AS DIRECTED ONCE A DAY. DX CODE: E11.8 100 each 1  . apixaban (ELIQUIS) 5 MG TABS tablet Take 1 tablet (5 mg total) by mouth 2 (two) times daily. 180 tablet 3   . FARXIGA 10 MG TABS tablet TAKE 1 TABLET BY MOUTH EVERY DAY 90 tablet 1  . metFORMIN (GLUCOPHAGE-XR) 500 MG 24 hr tablet Take 2 tablets (1,000 mg total) by mouth daily with breakfast. 60 tablet 11  . metoprolol tartrate (LOPRESSOR) 25 MG tablet Take 1 tablet (25 mg total) by mouth 2 (two) times daily. 180 tablet 3  . Semaglutide,0.25 or 0.5MG /DOS, (OZEMPIC, 0.25 OR 0.5 MG/DOSE,) 2 MG/1.5ML SOPN Inject 0.5 mg into the skin once a week. 3 pen 3  . valsartan-hydrochlorothiazide (DIOVAN-HCT) 320-12.5 MG tablet TAKE 1 TABLET BY MOUTH DAILY. PLEASE KEEP YOUR UPCOMING APPT FOR ANY FUTURE REFILLS 90 tablet 1   No current facility-administered medications for this visit.     Allergies:  Allergies  Allergen Reactions  . Ampicillin Rash    Past Medical History, Surgical history, Social history, and Family History remains without change after review.    Physical Exam:  Blood pressure 119/87, pulse 82, temperature 97.8 F (36.6 C), temperature source Tympanic, resp. rate 17, weight 224 lb 1 oz (101.6 kg), SpO2 99 %.   ECOG: 0   General appearance: Alert, awake without any distress. Head: Atraumatic without abnormalities Oropharynx: Without any thrush or ulcers. Eyes: No scleral icterus. Lymph nodes: No lymphadenopathy noted in the cervical, supraclavicular, or axillary nodes Heart:regular rate and rhythm, without any murmurs or gallops.   Lung: Clear to auscultation without any rhonchi, wheezes or dullness to percussion. Abdomin: Soft, nontender without any shifting dullness or ascites. Musculoskeletal: No clubbing or cyanosis. Neurological: No motor or  sensory deficits. Skin: No rashes or lesions.    Lab Results: Lab Results  Component Value Date   WBC 8.7 04/13/2018   HGB 15.2 04/13/2018   HCT 44.2 04/13/2018   MCV 90.6 04/13/2018   PLT 210 04/13/2018       Impression and Plan:  68 year old man with:   1.  Follicular lymphoma diagnosed in 2010.  He was found to  have stage IIIA disease that required treatment in 2013.  He has been in remission since that time.   The natural course of this disease as well as risk of relapse was assessed at this time.  He remains on active surveillance without any clinical signs or symptoms to suggest disease progression.  Laboratory data and physical exam did not show any suggestion of disease relapse.  Different salvage therapy may be needed if he develops progression of disease which include chemotherapy, chemoimmunotherapy and oral targeted therapy as options.  At this time, I recommended continue active surveillance and repeat imaging studies if he develops any symptoms or any signs of disease relapse.   2. Atrial fibrillation: Rate controlled without any recent issues.  3.  Age-appropriate cancer screening.  He is currently up-to-date.   4.  Follow-up: In 6 months for repeat evaluation.  15  minutes was spent with the patient face-to-face today.  More than 50% of time was spent on reviewing his disease status, reviewing laboratory data, discussing risk of relapse and future plan of care.     Tom Fuller 8/14/202011:36 AM

## 2019-01-11 NOTE — Telephone Encounter (Signed)
Scheduled appt per 8/14 los.  Spoke with patient and he is aware of his appt date and time.

## 2019-01-29 ENCOUNTER — Encounter: Payer: BC Managed Care – PPO | Admitting: Family Medicine

## 2019-02-06 ENCOUNTER — Other Ambulatory Visit: Payer: Self-pay

## 2019-02-08 ENCOUNTER — Encounter: Payer: Self-pay | Admitting: Internal Medicine

## 2019-02-08 ENCOUNTER — Other Ambulatory Visit: Payer: Self-pay

## 2019-02-08 ENCOUNTER — Ambulatory Visit: Payer: BC Managed Care – PPO | Admitting: Internal Medicine

## 2019-02-08 VITALS — BP 110/70 | HR 86 | Ht 70.0 in | Wt 224.0 lb

## 2019-02-08 DIAGNOSIS — E1122 Type 2 diabetes mellitus with diabetic chronic kidney disease: Secondary | ICD-10-CM | POA: Diagnosis not present

## 2019-02-08 DIAGNOSIS — E1165 Type 2 diabetes mellitus with hyperglycemia: Secondary | ICD-10-CM | POA: Diagnosis not present

## 2019-02-08 DIAGNOSIS — E1151 Type 2 diabetes mellitus with diabetic peripheral angiopathy without gangrene: Secondary | ICD-10-CM

## 2019-02-08 DIAGNOSIS — E669 Obesity, unspecified: Secondary | ICD-10-CM

## 2019-02-08 DIAGNOSIS — E785 Hyperlipidemia, unspecified: Secondary | ICD-10-CM

## 2019-02-08 DIAGNOSIS — IMO0002 Reserved for concepts with insufficient information to code with codable children: Secondary | ICD-10-CM

## 2019-02-08 LAB — POCT GLYCOSYLATED HEMOGLOBIN (HGB A1C): Hemoglobin A1C: 8 % — AB (ref 4.0–5.6)

## 2019-02-08 MED ORDER — FARXIGA 10 MG PO TABS: 10.0000 mg | ORAL_TABLET | Freq: Every day | ORAL | 3 refills | Status: DC

## 2019-02-08 MED ORDER — GLIPIZIDE 5 MG PO TABS: 5.0000 mg | ORAL_TABLET | Freq: Two times a day (BID) | ORAL | 3 refills | Status: DC

## 2019-02-08 NOTE — Patient Instructions (Addendum)
Please split: - Metformin ER into 500 mg 2x a day with meals  Start: - Glipizide 5 mg 2x a day 15-30 min before meals  Please continue: - Farxiga 10 mg before b'fast - Ozempic 0.5 mg weekly in am   Please return in 3-4 months with your sugar log.

## 2019-02-08 NOTE — Addendum Note (Signed)
Addended by: Cardell Peach I on: 02/08/2019 04:14 PM   Modules accepted: Orders

## 2019-02-08 NOTE — Progress Notes (Addendum)
Patient ID: Tom Fuller, male   DOB: 11-Nov-1950, 68 y.o.   MRN: [id number]   HPI: Tom Fuller is a 68 y.o.-year-old male, returning for follow-up for DM2, dx in 2017, non-insulin-dependent, uncontrolled, with complications (mild CKD, ED).  Last visit 7 months ago.  Last hemoglobin A1c was: Lab Results  Component Value Date   HGBA1C 8.5 (A) 07/06/2018   HGBA1C 12.1 (H) 01/12/2018   HGBA1C 10.7 (H) 09/19/2017   HGBA1C 8.1 (H) 03/21/2016   HGBA1C 7.9 (H) 10/29/2015   Pt is on a regimen of: - Metformin ER 1000 mg with dinner -started 06/2018 - Farxiga 10 mg before b'fast - Ozempic 0.5 mg weekly in am  - started 02/2018 >> has a little constipation, but not worrisome PCP tried to start Ozempic([place] approved) but he decided to hold off starting until he is seen here) He stopped regular metformin due to abdominal pain  Pt checks his sugars once a day: - am: 214-346 >> 147, 166-230 >> 134, 147-176, 189, 197 - 2h after b'fast: n/c - before lunch: n/c - 2h after lunch: n/c - before dinner: n/c - 2h after dinner: n/c - bedtime: n/c - nighttime: n/c Lowest sugar was 220 >> 147 >> 134; he has hypoglycemia awareness in the 70s. Highest sugar was 346 >> 230 >> 197.  Glucometer: Accu-Chek  Pt's meals are: - Breakfast: cereal (rice crispies) with milk, banana, OJ - Lunch: banana sandwich, whole wheat - Dinner: varies - Snacks: 2-3x He was drinking orange juice and milk in the past and I advised him to stop >> but he still continues.  -+ Mild CKD, last BUN/creatinine:  Lab Results  Component Value Date   BUN 22 01/11/2019   BUN 28 (H) 04/16/2018   CREATININE 1.07 01/11/2019   CREATININE 1.02 04/16/2018  On valsartan 320.  -+ HL; last set of lipids: Lab Results  Component Value Date   CHOL 134 01/12/2018   HDL 31 (L) 01/12/2018   LDLCALC 80 01/12/2018   TRIG 132 01/12/2018   CHOLHDL 4.3 01/12/2018   - last eye exam was in 12/28/2018: No DR, + catatact  -No numbness and  tingling in his feet.  Pt has FH of DM in PGF.  ROS: Constitutional: no weight gain/no weight loss, no fatigue, no subjective hyperthermia, no subjective hypothermia Eyes: no blurry vision, no xerophthalmia ENT: no sore throat, no nodules palpated in neck, no dysphagia, no odynophagia, no hoarseness Cardiovascular: no CP/no SOB/no palpitations/no leg swelling Respiratory: no cough/no SOB/no wheezing Gastrointestinal: no N/no V/no D/no C/no acid reflux Musculoskeletal: no muscle aches/no joint aches Skin: no rashes, no hair loss Neurological: no tremors/no numbness/no tingling/no dizziness  I reviewed pt's medications, allergies, PMH, social hx, family hx, and changes were documented in the history of present illness. Otherwise, unchanged from my initial visit note.  Past Medical History:  Diagnosis Date  . Atrial fibrillation ([place])   . Cervical lymphadenopathy   . Erectile dysfunction   . GERD (gastroesophageal reflux disease)   . Hypertension   . Lymphoma, follicular ([place])    Dr.Shadad   Past Surgical History:  Procedure Laterality Date  . a bsc    . ABSCESS DRAINAGE     under rt eye   . DEEP NECK LYMPH NODE BIOPSY / EXCISION  2011  . VASECTOMY     Social History   Socioeconomic History  . Marital status: Married    Spouse name: Not on file  . Number of children: 2  Occupational History  .  Saw operator  Tobacco Use  . Smoking status: Never Smoker  . Smokeless tobacco: Never Used  Substance and Sexual Activity  . Alcohol use: No  . Drug use: No  . Sexual activity: Yes    Partners: Female   Current Outpatient Medications on File Prior to Visit  Medication Sig Dispense Refill  . Accu-Chek FastClix Lancets MISC USE AS DIRECTED ONCE A DAY. DX CODE: E11.8 102 each 1  . ACCU-CHEK GUIDE test strip USE AS DIRECTED ONCE A DAY. DX CODE: E11.8 100 each 1  . apixaban (ELIQUIS) 5 MG TABS tablet Take 1 tablet (5 mg total) by mouth 2 (two) times daily. 180 tablet 3  .  FARXIGA 10 MG TABS tablet TAKE 1 TABLET BY MOUTH EVERY DAY 90 tablet 1  . metFORMIN (GLUCOPHAGE-XR) 500 MG 24 hr tablet Take 2 tablets (1,000 mg total) by mouth daily with breakfast. 60 tablet 11  . metoprolol tartrate (LOPRESSOR) 25 MG tablet Take 1 tablet (25 mg total) by mouth 2 (two) times daily. 180 tablet 3  . Semaglutide,0.25 or 0.5MG /DOS, (OZEMPIC, 0.25 OR 0.5 MG/DOSE,) 2 MG/1.5ML SOPN Inject 0.5 mg into the skin once a week. 3 pen 3  . valsartan-hydrochlorothiazide (DIOVAN-HCT) 320-12.5 MG tablet TAKE 1 TABLET BY MOUTH DAILY. PLEASE KEEP YOUR UPCOMING APPT FOR ANY FUTURE REFILLS 90 tablet 1   No current facility-administered medications on file prior to visit.    Allergies  Allergen Reactions  . Ampicillin Rash   Family History  Problem Relation Age of Onset  . Hypertension Mother   . Kidney disease Mother   . Hypertension Father   . Hypertension Brother   . Diabetes Paternal Grandfather   . Hypertension Paternal Grandfather   . Kidney disease Maternal Aunt   . Kidney disease Maternal Grandmother     PE: BP 110/70   Pulse 86   Ht 5\' 10"  (1.778 m)   Wt 224 lb (101.6 kg)   SpO2 98%   BMI 32.14 kg/m  Wt Readings from Last 3 Encounters:  02/08/19 224 lb (101.6 kg)  01/11/19 224 lb 1 oz (101.6 kg)  07/20/18 224 lb (101.6 kg)   Constitutional: overweight, in NAD Eyes: PERRLA, EOMI, no exophthalmos ENT: moist mucous membranes, no thyromegaly, no cervical lymphadenopathy Cardiovascular: RRR, No MRG Respiratory: CTA B Gastrointestinal: abdomen soft, NT, ND, BS+ Musculoskeletal: no deformities, strength intact in all 4 Skin: moist, warm, no rashes Neurological: no tremor with outstretched hands, DTR normal in all 4  ASSESSMENT: 1. DM2, non-insulin-dependent, uncontrolled, with long-term complications - mild CKD - ED  2. HL  3.  Obesity class I  PLAN:  1. Patient with longstanding, uncontrolled, type 2 diabetes, on oral antidiabetic regimen and also weekly GLP-1  receptor agonist.  At last visit, he had to stop regular metformin due to abdominal pain and we discussed about starting metformin ER.  He is currently on 1000 mg with dinner and he tolerates this well.  He is on a low dose of Ozempic but at last visit he had some mild abdominal pain/constipation/borborygmi so we did not decrease the dose.  At that time I gave him a coupon card for Ozempic and farxiga.  I also advised him to check sugars throughout the day as he was only checking them in the morning. -At this visit, He is still checking sugars only in the morning and they do appear improved, but not at goal yet.  He tells me that he continues  to eat a very carb rich high glycemic index breakfast, with sugary cereals, milk, orange juice, and banana.  We discussed about changing this.  Given examples of a more healthy breakfast.  I also suggested to start glipizide before breakfast and dinner for now.  We will continue the rest of her regimen but since he is having some abdominal pain possibly from metformin I advised him to split the dose into 2. - I suggested to:  Patient Instructions  Please split: - Metformin ER into 500 mg 2x a day with meals  Start: - Glipizide 5 mg 2x a day 15-30 min before meals  Please continue: - Farxiga 10 mg before b'fast - Ozempic 0.5 mg weekly in am   Please return in 3-4 months with your sugar log.   - we checked his HbA1c: 8% (better) - advised to check sugars at different times of the day - 1x a day, rotating check times - advised for yearly eye exams >> he is UTD - return to clinic in 3-4 months  2. HL - Reviewed latest lipid panel from 12/2017: At goal with the exception of a low HDL Lab Results  Component Value Date   CHOL 134 01/12/2018   HDL 31 (L) 01/12/2018   LDLCALC 80 01/12/2018   TRIG 132 01/12/2018   CHOLHDL 4.3 01/12/2018  -He is not on a statin. -We will recheck level today  3.  Obesity class I -Continue SGLT 2 inhibitor and GLP-1  receptor agonist which should also help with weight loss -Before last visit he lost 6 pounds, now weight stable  Component     Latest Ref Rng & Units 02/08/2019  Cholesterol     <200 mg/dL 123  HDL Cholesterol     > OR = 40 mg/dL 38 (L)  Triglycerides     <150 mg/dL 103  LDL Cholesterol (Calc)     mg/dL (calc) 66  Total CHOL/HDL Ratio     <5.0 (calc) 3.2  Non-HDL Cholesterol (Calc)     <130 mg/dL (calc) 85  Normal lipid fractions with the exception of a slightly low HDL.   Philemon Kingdom, MD PhD [facility]

## 2019-02-09 LAB — LIPID PANEL
Cholesterol: 123 mg/dL (ref <200)
HDL: 38 mg/dL — ABNORMAL LOW (ref >=40)
LDL Cholesterol (Calc): 66 mg/dL (calc)
Non-HDL Cholesterol (Calc): 85 mg/dL (calc) (ref <130)
Total CHOL/HDL Ratio: 3.2 (calc) (ref <5.0)
Triglycerides: 103 mg/dL (ref <150)

## 2019-03-08 ENCOUNTER — Other Ambulatory Visit: Payer: Self-pay | Admitting: Family Medicine

## 2019-03-08 DIAGNOSIS — E1151 Type 2 diabetes mellitus with diabetic peripheral angiopathy without gangrene: Secondary | ICD-10-CM

## 2019-03-08 DIAGNOSIS — IMO0002 Reserved for concepts with insufficient information to code with codable children: Secondary | ICD-10-CM

## 2019-05-09 ENCOUNTER — Other Ambulatory Visit: Payer: Self-pay

## 2019-05-10 ENCOUNTER — Ambulatory Visit (INDEPENDENT_AMBULATORY_CARE_PROVIDER_SITE_OTHER): Payer: BC Managed Care – PPO | Admitting: Internal Medicine

## 2019-05-10 ENCOUNTER — Encounter: Payer: Self-pay | Admitting: Internal Medicine

## 2019-05-10 DIAGNOSIS — E1122 Type 2 diabetes mellitus with diabetic chronic kidney disease: Secondary | ICD-10-CM

## 2019-05-10 DIAGNOSIS — E669 Obesity, unspecified: Secondary | ICD-10-CM

## 2019-05-10 DIAGNOSIS — E785 Hyperlipidemia, unspecified: Secondary | ICD-10-CM | POA: Diagnosis not present

## 2019-05-10 MED ORDER — METFORMIN HCL ER 500 MG PO TB24: 500.0000 mg | ORAL_TABLET | Freq: Two times a day (BID) | ORAL | 3 refills | Status: DC

## 2019-05-10 NOTE — Progress Notes (Signed)
Patient ID: Tom Fuller, male   DOB: [birth year], 68 y.o.   MRN: [id number]   Patient location: Home My location: Office Persons participating in the virtual visit: patient, provider  Referring Provider: Ann Held, DO  I connected with the patient on 05/10/19 at  3:37 PM EST by telephone and verified that I am speaking with the correct person.   I discussed the limitations of evaluation and management by telephone and the availability of in person appointments. The patient expressed understanding and agreed to proceed.   Details of the encounter are shown below.  HPI: Tom Fuller is a 68 y.o.-year-old male, returning for follow-up for DM2, dx in 2017, non-insulin-dependent, uncontrolled, with complications (mild CKD, ED).  Last visit 3 months ago.  Last hemoglobin A1c was: Lab Results  Component Value Date   HGBA1C 8.0 (A) 02/08/2019   HGBA1C 8.5 (A) 07/06/2018   HGBA1C 12.1 (H) 01/12/2018   HGBA1C 10.7 (H) 09/19/2017   HGBA1C 8.1 (H) 03/21/2016   HGBA1C 7.9 (H) 10/29/2015   Pt is on a regimen of: - Metformin ER 1000 mg with dinner -started 06/2018 >> 500 mg 2x a day with meals - Farxiga 10 mg before b'fast - Glipizide 5 mg 2x a day before meals -started 01/2019 - Ozempic 0.5 mg weekly in am  - started 02/2018 >> has a little constipation, but not worrisome PCP tried to start Ozempic([place] approved) but he decided to hold off starting until he is seen here) He stopped regular metformin due to abdominal pain  Pt checks his sugars 1x a day: - am: 214-346 >> 147, 166-230 >> 134, 147-176, 189, 197 >> 96, 104-125, 130 - 2h after b'fast: n/c - before lunch: n/c - 2h after lunch: n/c - before dinner: n/c - 2h after dinner: 97 - bedtime: n/c - nighttime: n/c Lowest sugar was 220 >> 147 >> 134 >> 98; he has hypoglycemia awareness in the 70s. Highest sugar was 346 >> 230 >> 197 >> 130.  Glucometer: Accu-Chek  Pt's meals are: - Breakfast: cereal (rice crispies) with  milk, boiled eggs - Lunch: banana sandwich, whole wheat - Dinner: varies - Snacks: 2-3x He was drinking orange juice and milk in the past and I advised him to stop >> but he still continues.  -+ Mild CKD, last BUN/creatinine:  Lab Results  Component Value Date   BUN 22 01/11/2019   BUN 28 (H) 04/16/2018   CREATININE 1.07 01/11/2019   CREATININE 1.02 04/16/2018  On valsartan 320.  -+ HL; last set of lipids: Lab Results  Component Value Date   CHOL 123 02/08/2019   HDL 38 (L) 02/08/2019   LDLCALC 66 02/08/2019   TRIG 103 02/08/2019   CHOLHDL 3.2 02/08/2019   - last eye exam was in 11/2018: No DR, + cataract  -he denies numbness and tingling in his feet.  Pt has FH of DM in PGF.  ROS: Constitutional: + weight gain/no weight loss, no fatigue, no subjective hyperthermia, no subjective hypothermia Eyes: no blurry vision, no xerophthalmia ENT: no sore throat, no nodules palpated in neck, no dysphagia, no odynophagia, no hoarseness Cardiovascular: no CP/no SOB/no palpitations/no leg swelling Respiratory: no cough/no SOB/no wheezing Gastrointestinal: no N/no V/no D/no C/no acid reflux Musculoskeletal: no muscle aches/no joint aches Skin: no rashes, no hair loss Neurological: no tremors/no numbness/no tingling/no dizziness  I reviewed pt's medications, allergies, PMH, social hx, family hx, and changes were documented in the history of present illness. Otherwise, unchanged  from my initial visit note.  Past Medical History:  Diagnosis Date  . Atrial fibrillation ([place])   . Cervical lymphadenopathy   . Erectile dysfunction   . GERD (gastroesophageal reflux disease)   . Hypertension   . Lymphoma, follicular ([place])    Dr.Shadad   Past Surgical History:  Procedure Laterality Date  . a bsc    . ABSCESS DRAINAGE     under rt eye   . DEEP NECK LYMPH NODE BIOPSY / EXCISION  2011  . VASECTOMY     Social History   Socioeconomic History  . Marital status: Married    Spouse  name: Not on file  . Number of children: 2  Occupational History  .  Saw operator  Tobacco Use  . Smoking status: Never Smoker  . Smokeless tobacco: Never Used  Substance and Sexual Activity  . Alcohol use: No  . Drug use: No  . Sexual activity: Yes    Partners: Female   Current Outpatient Medications on File Prior to Visit  Medication Sig Dispense Refill  . Accu-Chek FastClix Lancets MISC USE AS DIRECTED ONCE A DAY. DX CODE: E11.8 102 each 1  . ACCU-CHEK GUIDE test strip USE AS DIRECTED ONCE A DAY. DX CODE: E11.8 100 strip 1  . apixaban (ELIQUIS) 5 MG TABS tablet Take 1 tablet (5 mg total) by mouth 2 (two) times daily. 180 tablet 3  . FARXIGA 10 MG TABS tablet TAKE 1 TABLET BY MOUTH EVERY DAY 90 tablet 1  . glipiZIDE (GLUCOTROL) 5 MG tablet Take 1 tablet (5 mg total) by mouth 2 (two) times daily before a meal. 180 tablet 3  . metFORMIN (GLUCOPHAGE-XR) 500 MG 24 hr tablet Take 2 tablets (1,000 mg total) by mouth daily with breakfast. 60 tablet 11  . metoprolol tartrate (LOPRESSOR) 25 MG tablet Take 1 tablet (25 mg total) by mouth 2 (two) times daily. 180 tablet 3  . Semaglutide,0.25 or 0.5MG /DOS, (OZEMPIC, 0.25 OR 0.5 MG/DOSE,) 2 MG/1.5ML SOPN Inject 0.5 mg into the skin once a week. 3 pen 3  . valsartan-hydrochlorothiazide (DIOVAN-HCT) 320-12.5 MG tablet TAKE 1 TABLET BY MOUTH DAILY. PLEASE KEEP YOUR UPCOMING APPT FOR ANY FUTURE REFILLS 90 tablet 1   No current facility-administered medications on file prior to visit.   Allergies  Allergen Reactions  . Ampicillin Rash   Family History  Problem Relation Age of Onset  . Hypertension Mother   . Kidney disease Mother   . Hypertension Father   . Hypertension Brother   . Diabetes Paternal Grandfather   . Hypertension Paternal Grandfather   . Kidney disease Maternal Aunt   . Kidney disease Maternal Grandmother     PE: There were no vitals taken for this visit. Wt Readings from Last 3 Encounters:  02/08/19 224 lb (101.6 kg)   01/11/19 224 lb 1 oz (101.6 kg)  07/20/18 224 lb (101.6 kg)   Constitutional:  in NAD  The physical exam was not performed (telephone visit).  ASSESSMENT: 1. DM2, non-insulin-dependent, uncontrolled, with long-term complications - mild CKD - ED  2. HL  3.  Obesity class I  PLAN:  1. Patient longstanding, uncontrolled, type 2 diabetes, on oral antidiabetic regimen and also weekly GLP-1 receptor agonist.  In the past, he had to stop Metformin due to abdominal pain and we switched to Metformin ER, with which he still had some abdominal pain.  At last visit, we split the dose into 500 mg twice a day and he tolerates this dose  much better.  We also added glipizide 5 mg twice a day before meals at last visit.  This changes made a significant change in his blood sugars, with now much improved levels in the morning.  He unfortunately does not check sugars later in the day and I strongly advised him to do so. -We again discussed about improving breakfast, which is still carb rich, except for the time when he had boiled eggs.  At last visit I gave him examples about a more healthy breakfast -Otherwise, at this visit, due to improvement of his sugars, we will not change his regimen. -At next visit, depending on his sugars, we may increase the Ozempic dose and back off glipizide - I suggested to:  Patient Instructions  Please continue: - Metformin ER 500 mg 2x a day with meals - Glipizide 5 mg 2x a day 15-30 min before meals - Farxiga 10 mg before breakfast - Ozempic 0.5 mg weekly in am   Please return in 3-4 months with your sugar log.   - we will recheck his HbA1c when he returns in the clinic - advised to check sugars at different times of the day - 1x a day, rotating check times - advised for yearly eye exams >> he is UTD - return to clinic in 3-4 months  2. HL -Reviewed latest lipid panel from last visit: LDL at goal, HDL slightly low, triglycerides at goal: Lab Results  Component  Value Date   CHOL 123 02/08/2019   HDL 38 (L) 02/08/2019   LDLCALC 66 02/08/2019   TRIG 103 02/08/2019   CHOLHDL 3.2 02/08/2019  -He is not on a statin  3.  Obesity class I -Continue SGLT2 inh. And GLP-1 receptor agonist, which should also help with weight loss -At this visit, his weight is a little higher: 4-5 lbs, most likely from glipizide.  I am hoping that we can reduce the dose or even stop this in the future  - time spent with the patient: 12 min, of which >50% was spent in obtaining information about his symptoms, reviewing his previous labs, evaluations, and treatments, counseling him about his conditions (please see the discussed topics above), and developing a plan to further investigate and treat them; he had a number of questions which I addressed.  Philemon Kingdom, MD PhD [facility] Endocrinology

## 2019-05-10 NOTE — Patient Instructions (Signed)
Please continue: - Metformin ER 500 mg 2x a day with meals - Glipizide 5 mg 2x a day 15-30 min before meals - Farxiga 10 mg before breakfast - Ozempic 0.5 mg weekly in am   Please return in 3-4 months with your sugar log.

## 2019-06-09 ENCOUNTER — Other Ambulatory Visit: Payer: Self-pay | Admitting: Internal Medicine

## 2019-06-21 ENCOUNTER — Telehealth: Payer: Self-pay

## 2019-06-21 DIAGNOSIS — I10 Essential (primary) hypertension: Secondary | ICD-10-CM

## 2019-06-21 MED ORDER — VALSARTAN-HYDROCHLOROTHIAZIDE 320-12.5 MG PO TABS: ORAL_TABLET | ORAL | 0 refills | Status: DC

## 2019-06-21 NOTE — Telephone Encounter (Signed)
30 day refill sent.

## 2019-06-21 NOTE — Telephone Encounter (Signed)
Patient called in needing  a prescription sent over for VALSARTAN HYDROCHLOROTHIAZIDE TABLETS 320 MG he is completely out. Please follow up with the patient at [phone] thanks.

## 2019-06-25 ENCOUNTER — Other Ambulatory Visit: Payer: Self-pay | Admitting: *Deleted

## 2019-06-25 DIAGNOSIS — I10 Essential (primary) hypertension: Secondary | ICD-10-CM

## 2019-06-25 MED ORDER — VALSARTAN-HYDROCHLOROTHIAZIDE 320-12.5 MG PO TABS: ORAL_TABLET | ORAL | 0 refills | Status: DC

## 2019-07-21 ENCOUNTER — Other Ambulatory Visit: Payer: Self-pay | Admitting: Family Medicine

## 2019-08-12 ENCOUNTER — Ambulatory Visit: Payer: BC Managed Care – PPO | Admitting: Family Medicine

## 2019-08-12 ENCOUNTER — Encounter: Payer: Self-pay | Admitting: Family Medicine

## 2019-08-12 ENCOUNTER — Other Ambulatory Visit: Payer: Self-pay

## 2019-08-12 VITALS — BP 122/81 | HR 84 | Temp 98.3°F | Resp 12 | Ht 71.0 in | Wt 241.1 lb

## 2019-08-12 DIAGNOSIS — E1159 Type 2 diabetes mellitus with other circulatory complications: Secondary | ICD-10-CM

## 2019-08-12 DIAGNOSIS — E1169 Type 2 diabetes mellitus with other specified complication: Secondary | ICD-10-CM

## 2019-08-12 DIAGNOSIS — R252 Cramp and spasm: Secondary | ICD-10-CM

## 2019-08-12 DIAGNOSIS — E1122 Type 2 diabetes mellitus with diabetic chronic kidney disease: Secondary | ICD-10-CM | POA: Diagnosis not present

## 2019-08-12 DIAGNOSIS — I480 Paroxysmal atrial fibrillation: Secondary | ICD-10-CM

## 2019-08-12 DIAGNOSIS — Z7689 Persons encountering health services in other specified circumstances: Secondary | ICD-10-CM

## 2019-08-12 DIAGNOSIS — E785 Hyperlipidemia, unspecified: Secondary | ICD-10-CM

## 2019-08-12 DIAGNOSIS — I152 Hypertension secondary to endocrine disorders: Secondary | ICD-10-CM

## 2019-08-12 DIAGNOSIS — I10 Essential (primary) hypertension: Secondary | ICD-10-CM

## 2019-08-12 NOTE — Progress Notes (Signed)
New patient office visit note:  Impression and Recommendations:    1. Encounter to establish care with new doctor   2. Type 2 diabetes mellitus with chronic kidney disease, without long-term current use of insulin, unspecified CKD stage ([place])   3. Hypertension associated with diabetes ([place])   4. Hyperlipidemia associated with type 2 diabetes mellitus (HCC)   5. Paroxysmal atrial fibrillation ([place])   6. Morbid obesity ([place])   7. Muscle cramps in legs     Encounter to establish care with new doctor - Extensive discussion held with patient regarding establishing as a new patient.  Discussed policies and practices here at the clinic, and answered all questions about care team and health management during appointment.  - Discussed need for patient to continue to obtain management and screenings with all established specialists.  Educated patient at length about the critical importance of keeping health maintenance up to date.  - Participated in lengthy conversation and all questions were answered.  Type 2 diabetes mellitus with chronic kidney disease, without long-term current use of insulin, unspecified CKD stage - Patient is followed by endocrinology. - Last saw Dr. Cruzita Lederer 12 of 2020.  - Last A1c obtained 6 months ago, 8.0. - Advised patient of goal A1c under 7.0.  - Counseled patient on pathophysiology of diabetes and discussed various treatment options, which always includes dietary and lifestyle modification as first line.    - Importance of low carb, heart-healthy diet discussed with patient in addition to regular aerobic exercise of 52min 5d/week or more.   - Check FBS and 2 hours after the biggest meal of your day.  Keep log and bring in next OV for my review.     - Also told patient if you ever feel poorly, please check your blood pressure and blood sugar, as one or the other could be the cause of your symptoms.  - Handouts provided at patient's desire and or  told to go online at the American Diabetes Association website for further information  - Will continue to monitor alongside specialist.  Hyperlipidemia associated with type 2 diabetes mellitus - Last FLP obtained 6 months ago: LDL = 66, WNL, at goal. HDL = 38, low. Triglycerides = 103, WNL, at goal.  - Will continue to monitor and re-check as discussed.  Hypertension associated with diabetes - Blood pressure currently stable, at goal. - Patient will continue current treatment regimen.  See med list.  - Counseled patient on pathophysiology of disease and discussed various treatment options, which always includes dietary and lifestyle modification as first line.   - Lifestyle changes such as dash and heart healthy diets and engaging in a regular exercise program discussed extensively with patient.   - Ambulatory blood pressure monitoring encouraged at least 3 times weekly.  Keep log and bring in every office visit.  Reminded patient that if they ever feel poorly in any way, to check their blood pressure and pulse.  - Handouts provided at patient's desire and/or told to go online at the [place] website for further information  - We will continue to monitor.  Paroxysmal atrial fibrillation - Patient in atrial fibrillation today. - Continue treatment plan as established. - Continue follow-up with specialists as established.  - Will continue to monitor alongside specialist.  Muscle Cramps in Legs - Advised patient to engage in increased physical activity, moving his legs more regularly. - At night, to help reduce swelling, encouraged patient to elevate  his feet above the level of the heart. - Encouraged patient to drink sugar free, sodium free, caffeine free beverages, and to reduce intake of salt and sugar in his diet. - Will continue to monitor.  Health Counseling & Preventative Maintenance - Advised patient to continue working toward exercising and prudent  weight loss to improve overall mental, physical, and emotional health.    - Reviewed the "spokes of the wheel" of mood and health management.  Stressed the importance of ongoing prudent habits, including regular exercise, appropriate sleep hygiene, healthful dietary habits, and prayer/meditation to relax.  - Encouraged patient to engage in daily physical activity as tolerated, especially a formal exercise routine.  Recommended that the patient eventually strive for at least 150 minutes of moderate cardiovascular activity per week according to guidelines established by the [facility].   - Healthy dietary habits encouraged, including low-carb, and high amounts of lean protein in diet.   - Patient should also consume adequate amounts of water, about 120 ounces per day. - To help reduce leg cramps, advised patient to hydrate himself more adequately.    Education and routine counseling performed. Handouts provided.   Please see AVS handed out to patient at the end of our visit for further patient instructions/ counseling done pertaining to today's office visit.    Note:  This document was prepared using Dragon voice recognition software and may include unintentional dictation errors.   The [place] was signed into law in 2016 which includes the topic of electronic health records.  This provides immediate access to information in MyChart.  This includes consultation notes, operative notes, office notes, lab results and pathology reports.  If you have any questions about what you read please let us know at your next visit or call us at the office.  We are right here with you.  This document serves as a record of services personally performed by Mellody Dance, DO. It was created on her behalf by Toni Amend, a trained medical scribe. The creation of this record is based on the scribe's personal observations and the provider's statements to them.   This case required medical decision  making of at least moderate complexity.  The above documentation from Toni Amend, medical scribe, has been reviewed by Marjory Sneddon, D.O.  ---------------------------------------------------------------------------------------------------------------------------------------------------------------------------------------------  Subjective:     Tom Fuller, am serving as scribe for Dr.Pearley Baranek.  HPI: Tom Fuller is a pleasant 69 y.o. male who presents to [place] [facility] today to review their medical history with me and establish care.   I asked the patient to review their chronic problem list with me to ensure everything was updated and accurate.    All recent office visits with other providers, any medical records that patient brought in etc  - I reviewed today.     We asked pt to get [place] their medical records from [facility] providers/ specialists that they had seen within the past 3-5 years- if they are in private practice and/or do not work for [employer], [facility], [place], [place] or [employer] owned practice.  Told them to call their specialists to clarify this if they are not sure.    Reason for Establishing Care:  Saw Dr. Carollee Herter, same as his wife.  Believes his last visit with her was a couple of months ago.  Social History  Married to wife [place].  Continues working at [employer]. Has worked there since 2004; "it's the  best job I've ever had." Says the people there are good to him.  Tobacco Use Never smoker.  Alcohol Never drinks.  Drug Use No history of drug use, "just prescription and over the counter."  Exercise Does not exercise. Feels "I don't want to say it's old age, because I don't feel old, but everything is so complicated now."  Says that COVID has made things more complicated, too.  Hydration Has coffee in the morning, orange juice in the morning, a soft drink during the day, 2% milk.  Notes at work, they have a  wellness plan, and 24/7 help including a Engineer, maintenance.  Says his health coach wanted him to drink a gallon of water per day, and notes "if I drink too much, my stomach cramps."  Family History Paternal grandmother had breast cancer. Uncle had prostate cancer (dad's brother).  Grandfather had diabetes; notes "he had to take shots, type one, insulin I think."  High blood pressure runs in the family.  Renal failure in mom.  Notes "she was on home dialysis, and I think that's what put her in the hospital."  Notes his mom and dad both passed during hospital visits.  Past Medical History  - Type 2 Diabetes Mellitus w/ CKD Managed by Dr. Cruzita Lederer of Endocrinology.  - Hyperlipidemia associated with DM He's never been on any cholesterol medicines.  Discussed during appointment that patient's LDL was 66 last check, and FLP was overall controlled.  - Essential Hypertension Believes his blood pressure at home has been good. Thinks it runs around 121/82 on average, with a pulse of 95.  - Cardiac Health, Atrial Fibrillation Sees Dr. [name] of cardiology once yearly.  He has never had a stroke or heart attack, but does have atrial fibrillation.  - Follicular Lymphoma, Managed by Oncology Notes he has follicular lymphoma, in remission. They excised a lymph node from his left neck and discovered lymphoma.  States he had a port placed and "went through all of it."  He follows up with [facility] every six months.  Notes "[Dr. Shadad] says I'm doing good."  - Leg Cramps Says he experiences leg cramps at night when he lies down to watch TV with his wife; "the muscles just tighten up."  He knows magnesium, diuretics, and certain medications can lead to leg cramps.  Notes he's been told in the past "don't add salt to anything."    Wt Readings from Last 3 Encounters:  08/12/19 241 lb 1.6 oz (109.4 kg)  02/08/19 224 lb (101.6 kg)  01/11/19 224 lb 1 oz (101.6 kg)   BP Readings from Last  3 Encounters:  08/12/19 122/81  02/08/19 110/70  01/11/19 119/87   Pulse Readings from Last 3 Encounters:  08/12/19 84  02/08/19 86  01/11/19 82   BMI Readings from Last 3 Encounters:  08/12/19 33.63 kg/m  02/08/19 32.14 kg/m  01/11/19 32.15 kg/m    Patient Care Team    Relationship Specialty Notifications Start End  Mellody Dance, DO PCP - General Family Medicine  08/12/19   Evans Lance, MD Consulting Physician Cardiology  08/12/19   Philemon Kingdom, MD Consulting Physician Endocrinology  08/12/19   Wyatt Portela, MD Consulting Physician Oncology  08/12/19     Patient Active Problem List   Diagnosis Date Noted  . Lymphoma ([place]) 08/07/2008  . Obesity, Class I, BMI 30.0-34.9 (see actual BMI) 07/06/2018  . Type 2 diabetes mellitus with chronic kidney disease, without long-term current use of insulin (  [place]) 03/26/2016  . Hyperlipidemia LDL goal <70 03/26/2016  . Left otitis media 06/17/2015  . Preventative health care 09/23/2010  . ATRIAL FIBRILLATION 09/09/2008  . Enlarged lymph nodes 07/07/2008  . GERD 07/25/2007  . ERECTILE DYSFUNCTION 03/15/2007  . Essential hypertension 02/27/2007    As reported by pt:  Past Medical History:  Diagnosis Date  . Atrial fibrillation ([place])   . Cervical lymphadenopathy   . Erectile dysfunction   . GERD (gastroesophageal reflux disease)   . Hypertension   . Lymphoma, follicular ([place])    Dr.Shadad     Past Surgical History:  Procedure Laterality Date  . a bsc    . ABSCESS DRAINAGE     under rt eye   . DEEP NECK LYMPH NODE BIOPSY / EXCISION  2011  . VASECTOMY       Family History  Problem Relation Age of Onset  . Hypertension Mother   . Kidney disease Mother   . Hypertension Father   . Hypertension Brother   . Diabetes Paternal Grandfather   . Hypertension Paternal Grandfather   . Kidney disease Maternal Aunt   . Kidney disease Maternal Grandmother      Social History   Substance and Sexual Activity    Drug Use No     Social History   Substance and Sexual Activity  Alcohol Use No     Social History   Tobacco Use  Smoking Status Never Smoker  Smokeless Tobacco Never Used     Current Meds  Medication Sig  . apixaban (ELIQUIS) 5 MG TABS tablet Take 1 tablet (5 mg total) by mouth 2 (two) times daily.  Marland Kitchen FARXIGA 10 MG TABS tablet TAKE 1 TABLET BY MOUTH EVERY DAY  . glipiZIDE (GLUCOTROL) 5 MG tablet Take 1 tablet (5 mg total) by mouth 2 (two) times daily before a meal.  . metFORMIN (GLUCOPHAGE-XR) 500 MG 24 hr tablet Take 1 tablet (500 mg total) by mouth 2 (two) times daily after a meal. Hold until pt. needs it  . metoprolol tartrate (LOPRESSOR) 25 MG tablet Take 1 tablet (25 mg total) by mouth 2 (two) times daily.  Marland Kitchen OZEMPIC, 0.25 OR 0.5 MG/DOSE, 2 MG/1.5ML SOPN INJECT 0.5 MG INTO THE SKIN ONCE A WEEK.  . valsartan-hydrochlorothiazide (DIOVAN-HCT) 320-12.5 MG tablet TAKE 1 TABLET BY MOUTH DAILY.  NEEDS OV/FOLLOW UP BEFORE ANY MORE REFILLS    Allergies: Ampicillin   Review of Systems  Constitutional: Negative for chills, diaphoresis, fever, malaise/fatigue and weight loss.  HENT: Negative for congestion, sore throat and tinnitus.   Eyes: Negative for blurred vision, double vision and photophobia.  Respiratory: Negative for cough and wheezing.   Cardiovascular: Negative for chest pain and palpitations.  Gastrointestinal: Negative for blood in stool, diarrhea, nausea and vomiting.  Genitourinary: Negative for dysuria, frequency and urgency.  Musculoskeletal: Negative for joint pain and myalgias.  Skin: Negative for itching and rash.  Neurological: Negative for dizziness, focal weakness, weakness and headaches.  Endo/Heme/Allergies: Negative for environmental allergies and polydipsia. Does not bruise/bleed easily.  Psychiatric/Behavioral: Negative for depression and memory loss. The patient is not nervous/anxious and does not have insomnia.     Objective:   Blood pressure  122/81, pulse 84, temperature 98.3 F (36.8 C), temperature source Oral, resp. rate 12, height 5\' 11"  (1.803 m), weight 241 lb 1.6 oz (109.4 kg), SpO2 99 %. Body mass index is 33.63 kg/m. General: Well Developed, well nourished, and in no acute distress.  Neuro: Alert and oriented x3,  extra-ocular muscles intact, sensation grossly intact.  HEENT:[place]/AT, PERRLA, neck supple, No carotid bruits Skin: no gross rashes  Cardiac:  A-fib Respiratory: Essentially clear to auscultation bilaterally. Not using accessory muscles, speaking in full sentences.  Abdominal: not grossly distended Musculoskeletal: Ambulates w/o diff, FROM * 4 ext.  Vasc: less 2 sec cap RF, warm and pink  Psych:  No HI/SI, judgement and insight good, Euthymic mood. Full Affect.

## 2019-08-12 NOTE — Patient Instructions (Signed)
Risk factors for prediabetes and type 2 diabetes  Researchers don't fully understand why some people develop prediabetes and type 2 diabetes and others don't.  It's clear that certain factors increase the risk, however, including:  Weight. The more fatty tissue you have, the more resistant your cells become to insulin.  Inactivity. The less active you are, the greater your risk. Physical activity helps you control your weight, uses up glucose as energy and makes your cells more sensitive to insulin.  Family history. Your risk increases if a parent or sibling has type 2 diabetes.  Race. Although it's unclear why, people of certain races -- including blacks, Hispanics, American Indians and Asian-Americans -- are at higher risk.  Age. Your risk increases as you get older. This may be because you tend to exercise less, lose muscle mass and gain weight as you age. But type 2 diabetes is also increasing dramatically among children, adolescents and younger adults.  Gestational diabetes. If you developed gestational diabetes when you were pregnant, your risk of developing prediabetes and type 2 diabetes later increases. If you gave birth to a baby weighing more than 9 pounds (4 kilograms), you're also at risk of type 2 diabetes.  Polycystic ovary syndrome. For women, having polycystic ovary syndrome -- a common condition characterized by irregular menstrual periods, excess hair growth and obesity -- increases the risk of diabetes.  High blood pressure. Having blood pressure over 140/90 millimeters of mercury (mm Hg) is linked to an increased risk of type 2 diabetes.  Abnormal cholesterol and triglyceride levels. If you have low levels of high-density lipoprotein (HDL), or "good," cholesterol, your risk of type 2 diabetes is higher. Triglycerides are another type of fat carried in the blood. People with high levels of triglycerides have an increased risk of type 2 diabetes. Your doctor can let you know  what your cholesterol and triglyceride levels are.  A good guide to good carbs: The glycemic index ---If you have diabetes, or at risk for diabetes, you know all too well that when you eat carbohydrates, your blood sugar goes up. The total amount of carbs you consume at a meal or in a snack mostly determines what your blood sugar will do. But the food itself also plays a role. A serving of white rice has almost the same effect as eating pure table sugar -- a quick, high spike in blood sugar. A serving of lentils has a slower, smaller effect.  ---Picking good sources of carbs can help you control your blood sugar and your weight. Even if you don't have diabetes, eating healthier carbohydrate-rich foods can help ward off a host of chronic conditions, from heart disease to various cancers to, well, diabetes.  ---One way to choose foods is with the glycemic index (GI). This tool measures how much a food boosts blood sugar.  The glycemic index rates the effect of a specific amount of a food on blood sugar compared with the same amount of pure glucose. A food with a glycemic index of 28 boosts blood sugar only 28% as much as pure glucose. One with a GI of 95 acts like pure glucose.    High glycemic foods result in a quick spike in insulin and blood sugar (also known as blood glucose).  Low glycemic foods have a slower, smaller effect- these are healthier for you.   Using the glycemic index Using the glycemic index is easy: choose foods in the low GI category instead of those  in the high GI category (see below), and go easy on those in between. Low glycemic index (GI of 55 or less): Most fruits and vegetables, beans, minimally processed grains, pasta, low-fat dairy foods, and nuts.  Moderate glycemic index (GI 56 to 69): White and sweet potatoes, corn, white rice, couscous, breakfast cereals such as Cream of Wheat and Mini Wheats.  High glycemic index (GI of 70 or higher): White bread, rice cakes, most  crackers, bagels, cakes, doughnuts, croissants, most packaged breakfast cereals. You can see the values for 100 commons foods and get links to more at [email or web address].si.  Swaps for lowering glycemic index  Instead of this high-glycemic index food Eat this lower-glycemic index food  White rice Brown rice or converted rice  Instant oatmeal Steel-cut oats  Cornflakes Bran flakes  Baked potato Pasta, bulgur  White bread Whole-grain bread  Corn Peas or leafy greens       Prediabetes Eating Plan  Prediabetes--also called impaired glucose tolerance or impaired fasting glucose--is a condition that causes blood sugar (blood glucose) levels to be higher than normal. Following a healthy diet can help to keep prediabetes under control. It can also help to lower the risk of type 2 diabetes and heart disease, which are increased in people who have prediabetes. Along with regular exercise, a healthy diet:  Promotes weight loss.  Helps to control blood sugar levels.  Helps to improve the way that the body uses insulin.   WHAT DO I NEED TO KNOW ABOUT THIS EATING PLAN?   Use the glycemic index (GI) to plan your meals. The index tells you how quickly a food will raise your blood sugar. Choose low-GI foods. These foods take a longer time to raise blood sugar.  Pay close attention to the amount of carbohydrates in the food that you eat. Carbohydrates increase blood sugar levels.  Keep track of how many calories you take in. Eating the right amount of calories will help you to achieve a healthy weight. Losing about 7 percent of your starting weight can help to prevent type 2 diabetes.  You may want to follow a Mediterranean diet. This diet includes a lot of vegetables, lean meats or fish, whole grains, fruits, and healthy oils and fats.   WHAT FOODS CAN I EAT?  Grains Whole grains, such as whole-wheat or whole-grain breads, crackers, cereals, and pasta. Unsweetened oatmeal. Bulgur.  Barley. Quinoa. Brown rice. Corn or whole-wheat flour tortillas or taco shells. Vegetables Lettuce. Spinach. Peas. Beets. Cauliflower. Cabbage. Broccoli. Carrots. Tomatoes. Squash. Eggplant. Herbs. Peppers. Onions. Cucumbers. Brussels sprouts. Fruits Berries. Bananas. Apples. Oranges. Grapes. Papaya. Mango. Pomegranate. Kiwi. Grapefruit. Cherries. Meats and Other Protein Sources Seafood. Lean meats, such as chicken and [place] or lean cuts of pork and beef. Tofu. Eggs. Nuts. Beans. Dairy Low-fat or fat-free dairy products, such as yogurt, cottage cheese, and cheese. Beverages Water. Tea. Coffee. Sugar-free or diet soda. Seltzer water. Milk. Milk alternatives, such as soy or almond milk. Condiments Mustard. Relish. Low-fat, low-sugar ketchup. Low-fat, low-sugar barbecue sauce. Low-fat or fat-free mayonnaise. Sweets and Desserts Sugar-free or low-fat pudding. Sugar-free or low-fat ice cream and other frozen treats. Fats and Oils Avocado. Walnuts. Olive oil. The items listed above may not be a complete list of recommended foods or beverages. Contact your dietitian for more options.    WHAT FOODS ARE NOT RECOMMENDED?  Grains Refined white flour and flour products, such as bread, pasta, snack foods, and cereals. Beverages Sweetened drinks, such as sweet iced tea and soda.  Sweets and Desserts Baked goods, such as cake, cupcakes, pastries, cookies, and cheesecake. The items listed above may not be a complete list of foods and beverages to avoid. Contact your dietitian for more information.   This information is not intended to replace advice given to you by your health care provider. Make sure you discuss any questions you have with your health care provider.   Document Released: 09/30/2014 Document Reviewed: 09/30/2014 Elsevier Interactive Patient Education 2016 [employer].            Please realize, EXERCISE IS MEDICINE!  -  American Heart Association ( AHA) guidelines for  exercise : If you are in good health, without any medical conditions, you should engage in 150-300 minutes of moderate intensity aerobic activity per week.  This means you should be huffing and puffing throughout your workout.   Engaging in regular exercise will improve brain function and memory, as well as improve mood, boost immune system and help with weight management.  As well as the other, more well-known effects of exercise such as decreasing blood sugar levels, decreasing blood pressure,  and decreasing bad cholesterol levels/ increasing good cholesterol levels.     -  The AHA strongly endorses consumption of a diet that contains a variety of foods from all the food categories with an emphasis on fruits and vegetables; fat-free and low-fat dairy products; cereal and grain products; legumes and nuts; and fish, poultry, and/or extra lean meats.    Excessive food intake, especially of foods high in saturated and trans fats, sugar, and salt, should be avoided.    Adequate water intake of roughly 1/2 of your weight in pounds, should equal the ounces of water per day you should drink.  So for instance, if you're 200 pounds, that would be 100 ounces of water per day.         Mediterranean Diet  Why follow it? Research shows. . Those who follow the Mediterranean diet have a reduced risk of heart disease  . The diet is associated with a reduced incidence of Parkinson's and Alzheimer's diseases . People following the diet may have longer life expectancies and lower rates of chronic diseases  . The Dietary Guidelines for Americans recommends the Mediterranean diet as an eating plan to promote health and prevent disease  What Is the Mediterranean Diet?  . Healthy eating plan based on typical foods and recipes of Mediterranean-style cooking . The diet is primarily a plant based diet; these foods should make up a majority of meals   Starches - Plant based foods should make up a majority of meals -  They are an important sources of vitamins, minerals, energy, antioxidants, and fiber - Choose whole grains, foods high in fiber and minimally processed items  - Typical grain sources include wheat, oats, barley, corn, brown rice, bulgar, farro, millet, polenta, couscous  - Various types of beans include chickpeas, lentils, fava beans, black beans, white beans   Fruits  Veggies - Large quantities of antioxidant rich fruits & veggies; 6 or more servings  - Vegetables can be eaten raw or lightly drizzled with oil and cooked  - Vegetables common to the traditional Mediterranean Diet include: artichokes, arugula, beets, broccoli, brussel sprouts, cabbage, carrots, celery, collard greens, cucumbers, eggplant, kale, leeks, lemons, lettuce, mushrooms, okra, onions, peas, peppers, potatoes, pumpkin, radishes, rutabaga, shallots, spinach, sweet potatoes, turnips, zucchini - Fruits common to the Mediterranean Diet include: apples, apricots, avocados, cherries, clementines, dates, figs, grapefruits, grapes, melons, nectarines, oranges,  peaches, pears, pomegranates, strawberries, tangerines  Fats - Replace butter and margarine with healthy oils, such as olive oil, canola oil, and tahini  - Limit nuts to no more than a handful a day  - Nuts include walnuts, almonds, pecans, pistachios, pine nuts  - Limit or avoid candied, honey roasted or heavily salted nuts - Olives are central to the Mediterranean diet - can be eaten whole or used in a variety of dishes   Meats Protein - Limiting red meat: no more than a few times a month - When eating red meat: choose lean cuts and keep the portion to the size of deck of cards - Eggs: approx. 0 to 4 times a week  - Fish and lean poultry: at least 2 a week  - Healthy protein sources include, chicken, [place], lean beef, lamb - Increase intake of seafood such as tuna, salmon, trout, mackerel, shrimp, scallops - Avoid or limit high fat processed meats such as sausage and bacon   Dairy - Include moderate amounts of low fat dairy products  - Focus on healthy dairy such as fat free yogurt, skim milk, low or reduced fat cheese - Limit dairy products higher in fat such as whole or 2% milk, cheese, ice cream  Alcohol - Moderate amounts of red wine is ok  - No more than 5 oz daily for women (all ages) and men older than age 54  - No more than 10 oz of wine daily for men younger than 46  Other - Limit sweets and other desserts  - Use herbs and spices instead of salt to flavor foods  - Herbs and spices common to the traditional Mediterranean Diet include: basil, bay leaves, chives, cloves, cumin, fennel, garlic, lavender, marjoram, mint, oregano, parsley, pepper, rosemary, sage, savory, sumac, tarragon, thyme   It's not just a diet, it's a lifestyle:  . The Mediterranean diet includes lifestyle factors typical of those in the region  . Foods, drinks and meals are best eaten with others and savored . Daily physical activity is important for overall good health . This could be strenuous exercise like running and aerobics . This could also be more leisurely activities such as walking, housework, yard-work, or taking the stairs . Moderation is the key; a balanced and healthy diet accommodates most foods and drinks . Consider portion sizes and frequency of consumption of certain foods   Meal Ideas & Options:  . Breakfast:  o Whole wheat toast or whole wheat English muffins with peanut butter & hard boiled egg o Steel cut oats topped with apples & cinnamon and skim milk  o Fresh fruit: banana, strawberries, melon, berries, peaches  o Smoothies: strawberries, bananas, greek yogurt, peanut butter o Low fat greek yogurt with blueberries and granola  o Egg white omelet with spinach and mushrooms o Breakfast couscous: whole wheat couscous, apricots, skim milk, cranberries  . Sandwiches:  o Hummus and grilled vegetables (peppers, zucchini, squash) on whole wheat bread   o Grilled  chicken on whole wheat pita with lettuce, tomatoes, cucumbers or tzatziki  o Tuna salad on whole wheat bread: tuna salad made with greek yogurt, olives, red peppers, capers, green onions o Garlic rosemary lamb pita: lamb sauted with garlic, rosemary, salt & pepper; add lettuce, cucumber, greek yogurt to pita - flavor with lemon juice and black pepper  . Seafood:  o Mediterranean grilled salmon, seasoned with garlic, basil, parsley, lemon juice and black pepper o Shrimp, lemon, and spinach whole-grain pasta salad  made with low fat greek yogurt  o Seared scallops with lemon orzo  o Seared tuna steaks seasoned salt, pepper, coriander topped with tomato mixture of olives, tomatoes, olive oil, minced garlic, parsley, green onions and cappers  . Meats:  o Herbed greek chicken salad with kalamata olives, cucumber, feta  o Red bell peppers stuffed with spinach, bulgur, lean ground beef (or lentils) & topped with feta   o Kebabs: skewers of chicken, tomatoes, onions, zucchini, squash  o [place] burgers: made with red onions, mint, dill, lemon juice, feta cheese topped with roasted red peppers . Vegetarian o Cucumber salad: cucumbers, artichoke hearts, celery, red onion, feta cheese, tossed in olive oil & lemon juice  o Hummus and whole grain pita points with a greek salad (lettuce, tomato, feta, olives, cucumbers, red onion) o Lentil soup with celery, carrots made with vegetable broth, garlic, salt and pepper  o Tabouli salad: parsley, bulgur, mint, scallions, cucumbers, tomato, radishes, lemon juice, olive oil, salt and pepper.           Check out the DASH diet = 1.5 Gram Low Sodium Diet   A 1.5 gram sodium diet restricts the amount of sodium in the diet to no more than 1.5 g or 1500 mg daily.  The American Heart Association recommends Americans over the age of 46 to consume no more than 1500 mg of sodium each day to reduce the risk of developing high blood pressure.  Research also shows that  limiting sodium may reduce heart attack and stroke risk.  Many foods contain sodium for flavor and sometimes as a preservative.  When the amount of sodium in a diet needs to be low, it is important to know what to look for when choosing foods and drinks.  The following includes some information and guidelines to help make it easier for you to adapt to a low sodium diet.    QUICK TIPS  Do not add salt to food.  Avoid convenience items and fast food.  Choose unsalted snack foods.  Buy lower sodium products, often labeled as "lower sodium" or "no salt added."  Check food labels to learn how much sodium is in 1 serving.  When eating at a restaurant, ask that your food be prepared with less salt or none, if possible.    READING FOOD LABELS FOR SODIUM INFORMATION  The nutrition facts label is a good place to find how much sodium is in foods. Look for products with no more than 400 mg of sodium per serving.  Remember that 1.5 g = 1500 mg.  The food label may also list foods as:  Sodium-free: Less than 5 mg in a serving.  Very low sodium: 35 mg or less in a serving.  Low-sodium: 140 mg or less in a serving.  Light in sodium: 50% less sodium in a serving. For example, if a food that usually has 300 mg of sodium is changed to become light in sodium, it will have 150 mg of sodium.  Reduced sodium: 25% less sodium in a serving. For example, if a food that usually has 400 mg of sodium is changed to reduced sodium, it will have 300 mg of sodium.    CHOOSING FOODS  Grains  Avoid: Salted crackers and snack items. Some cereals, including instant hot cereals. Bread stuffing and biscuit mixes. Seasoned rice or pasta mixes.  Choose: Unsalted snack items. Low-sodium cereals, oats, puffed wheat and rice, shredded wheat. English muffins and bread. Pasta.  Meats  Avoid: Salted, canned, smoked, spiced, pickled meats, including fish and poultry. Bacon, ham, sausage, cold cuts, hot dogs, anchovies.  Choose:  Low-sodium canned tuna and salmon. Fresh or frozen meat, poultry, and fish.  Dairy  Avoid: Processed cheese and spreads. Cottage cheese. Buttermilk and condensed milk. Regular cheese.  Choose: Milk. Low-sodium cottage cheese. Yogurt. Sour cream. Low-sodium cheese.  Fruits and Vegetables  Avoid: Regular canned vegetables. Regular canned tomato sauce and paste. Frozen vegetables in sauces. Olives. Angie Fava. Relishes. Sauerkraut.  Choose: Low-sodium canned vegetables. Low-sodium tomato sauce and paste. Frozen or fresh vegetables. Fresh and frozen fruit.  Condiments  Avoid: Canned and packaged gravies. Worcestershire sauce. Tartar sauce. Barbecue sauce. Soy sauce. Steak sauce. Ketchup. Onion, garlic, and table salt. Meat flavorings and tenderizers.  Choose: Fresh and dried herbs and spices. Low-sodium varieties of mustard and ketchup. Lemon juice. Tabasco sauce. Horseradish.    SAMPLE 1.5 GRAM SODIUM MEAL PLAN:   Breakfast / Sodium (mg)  1 cup low-fat milk / 143 mg  1 whole-wheat English muffin / 240 mg  1 tbs heart-healthy margarine / 153 mg  1 hard-boiled egg / 139 mg  1 small orange / 0 mg  Lunch / Sodium (mg)  1 cup raw carrots / 76 mg  2 tbs no salt added peanut butter / 5 mg  2 slices whole-wheat bread / 270 mg  1 tbs jelly / 6 mg   cup red grapes / 2 mg  Dinner / Sodium (mg)  1 cup whole-wheat pasta / 2 mg  1 cup low-sodium tomato sauce / 73 mg  3 oz lean ground beef / 57 mg  1 small side salad (1 cup raw spinach leaves,  cup cucumber,  cup yellow bell pepper) with 1 tsp olive oil and 1 tsp red wine vinegar / 25 mg  Snack / Sodium (mg)  1 container low-fat vanilla yogurt / 107 mg  3 graham cracker squares / 127 mg  Nutrient Analysis  Calories: 1745  Protein: 75 g  Carbohydrate: 237 g  Fat: 57 g  Sodium: 1425 mg  Document Released: 05/16/2005 Document Revised: 01/26/2011 Document Reviewed: 08/17/2009  [facility] Patient Information 2012 [place], [place].

## 2019-08-16 ENCOUNTER — Ambulatory Visit: Payer: BC Managed Care – PPO | Admitting: Internal Medicine

## 2019-08-16 ENCOUNTER — Encounter: Payer: Self-pay | Admitting: Internal Medicine

## 2019-08-16 ENCOUNTER — Other Ambulatory Visit: Payer: Self-pay

## 2019-08-16 VITALS — BP 120/70 | HR 78 | Ht 71.0 in | Wt 232.0 lb

## 2019-08-16 DIAGNOSIS — E1122 Type 2 diabetes mellitus with diabetic chronic kidney disease: Secondary | ICD-10-CM | POA: Diagnosis not present

## 2019-08-16 DIAGNOSIS — E669 Obesity, unspecified: Secondary | ICD-10-CM | POA: Diagnosis not present

## 2019-08-16 DIAGNOSIS — E785 Hyperlipidemia, unspecified: Secondary | ICD-10-CM | POA: Diagnosis not present

## 2019-08-16 LAB — POCT GLYCOSYLATED HEMOGLOBIN (HGB A1C): Hemoglobin A1C: 5.9 % — AB (ref 4.0–5.6)

## 2019-08-16 NOTE — Progress Notes (Signed)
Patient ID: Tom Fuller, male   DOB: 07-12-1950, 69 y.o.   MRN: [id number]   This visit occurred during the SARS-CoV-2 public health emergency.  Safety protocols were in place, including screening questions prior to the visit, additional usage of staff PPE, and extensive cleaning of exam room while observing appropriate contact time as indicated for disinfecting solutions.   HPI: Tom Fuller is a 69 y.o.-year-old male, presenting for follow-up for DM2, dx in 2017, non-insulin-dependent, uncontrolled, with complications (mild CKD, ED).  Last visit 3 months ago (by telephone).  Reviewed HbA1c levels: Lab Results  Component Value Date   HGBA1C 8.0 (A) 02/08/2019   HGBA1C 8.5 (A) 07/06/2018   HGBA1C 12.1 (H) 01/12/2018   HGBA1C 10.7 (H) 09/19/2017   HGBA1C 8.1 (H) 03/21/2016   HGBA1C 7.9 (H) 10/29/2015   Pt is on a regimen of: - Metformin ER 1000 mg with dinner -started 06/2018 >> 500 mg 2x a day with meals - Farxiga 10 mg before b'fast - Glipizide 5 mg 2x a day before meals -started 01/2019 - Ozempic 0.5 mg weekly in am  - started 02/2018 >> mild constipation, but not worrisome. PCP tried to start Ozempic([place] approved) but he decided to hold off starting until he is seen here) He stopped regular metformin due to abdominal pain  Pt checks his sugars once a day: - am: 134, 147-176, 189, 197 >> 96, 104-125, 130 >> 93, 97-136, 141, 206 - 2h after b'fast: n/c - before lunch: n/c - 2h after lunch: n/c >> 141 - before dinner: n/c - 2h after dinner: 97 >> 70 - bedtime: n/c >> 140 - nighttime: n/c Lowest sugar was 220 >> 147 >> 134 >> 98 >> 70; he has hypoglycemia awareness in the 70s. Highest sugar was 346 >> 230 >> 197 >> 130 >> 206.  Glucometer: Accu-Chek  Pt's meals are: - Breakfast: cereal (rice crispies) with milk, boiled eggs - Lunch: banana sandwich, whole wheat - Dinner: varies - Snacks: 2-3x He was drinking orange juice and milk in the past and I advised him to stop >> now  just a little.  -+ Mild CKD, last BUN/creatinine:  Lab Results  Component Value Date   BUN 22 01/11/2019   BUN 28 (H) 04/16/2018   CREATININE 1.07 01/11/2019   CREATININE 1.02 04/16/2018  On valsartan 320.  -+ HL; last set of lipids: Lab Results  Component Value Date   CHOL 123 02/08/2019   HDL 38 (L) 02/08/2019   LDLCALC 66 02/08/2019   TRIG 103 02/08/2019   CHOLHDL 3.2 02/08/2019   - last eye exam was in 11/2018: No DR, + cataract  - no numbness and tingling in his feet.  Pt has FH of DM in PGF.  ROS: Constitutional: + weight gain/+ weight loss, no fatigue, no subjective hyperthermia, no subjective hypothermia Eyes: no blurry vision, no xerophthalmia ENT: no sore throat, no nodules palpated in neck, no dysphagia, no odynophagia, no hoarseness Cardiovascular: no CP/no SOB/no palpitations/no leg swelling Respiratory: no cough/no SOB/no wheezing Gastrointestinal: no N/no V/no D/no C/no acid reflux Musculoskeletal: no muscle aches/no joint aches Skin: no rashes, no hair loss Neurological: no tremors/no numbness/no tingling/no dizziness  I reviewed pt's medications, allergies, PMH, social hx, family hx, and changes were documented in the history of present illness. Otherwise, unchanged from my initial visit note.  Past Medical History:  Diagnosis Date  . Atrial fibrillation ([place])   . Cervical lymphadenopathy   . Erectile dysfunction   .  GERD (gastroesophageal reflux disease)   . Hypertension   . Lymphoma, follicular ([place])    Dr.Shadad   Past Surgical History:  Procedure Laterality Date  . a bsc    . ABSCESS DRAINAGE     under rt eye   . DEEP NECK LYMPH NODE BIOPSY / EXCISION  2011  . VASECTOMY     Social History   Socioeconomic History  . Marital status: Married    Spouse name: Not on file  . Number of children: 2  Occupational History  .  Saw operator  Tobacco Use  . Smoking status: Never Smoker  . Smokeless tobacco: Never Used  Substance and Sexual  Activity  . Alcohol use: No  . Drug use: No  . Sexual activity: Yes    Partners: Female   Current Outpatient Medications on File Prior to Visit  Medication Sig Dispense Refill  . Accu-Chek FastClix Lancets MISC USE AS DIRECTED ONCE A DAY. DX CODE: E11.8 102 each 1  . ACCU-CHEK GUIDE test strip USE AS DIRECTED ONCE A DAY. DX CODE: E11.8 100 strip 1  . apixaban (ELIQUIS) 5 MG TABS tablet Take 1 tablet (5 mg total) by mouth 2 (two) times daily. 180 tablet 3  . FARXIGA 10 MG TABS tablet TAKE 1 TABLET BY MOUTH EVERY DAY 90 tablet 1  . glipiZIDE (GLUCOTROL) 5 MG tablet Take 1 tablet (5 mg total) by mouth 2 (two) times daily before a meal. 180 tablet 3  . metFORMIN (GLUCOPHAGE-XR) 500 MG 24 hr tablet Take 1 tablet (500 mg total) by mouth 2 (two) times daily after a meal. Hold until pt. needs it 180 tablet 3  . metoprolol tartrate (LOPRESSOR) 25 MG tablet Take 1 tablet (25 mg total) by mouth 2 (two) times daily. 180 tablet 3  . OZEMPIC, 0.25 OR 0.5 MG/DOSE, 2 MG/1.5ML SOPN INJECT 0.5 MG INTO THE SKIN ONCE A WEEK. 3 pen 1  . valsartan-hydrochlorothiazide (DIOVAN-HCT) 320-12.5 MG tablet TAKE 1 TABLET BY MOUTH DAILY.  NEEDS OV/FOLLOW UP BEFORE ANY MORE REFILLS 90 tablet 0   No current facility-administered medications on file prior to visit.   Allergies  Allergen Reactions  . Ampicillin Rash   Family History  Problem Relation Age of Onset  . Hypertension Mother   . Kidney disease Mother   . Hypertension Father   . Hypertension Brother   . Diabetes Paternal Grandfather   . Hypertension Paternal Grandfather   . Kidney disease Maternal Aunt   . Kidney disease Maternal Grandmother    PE: BP 120/70   Pulse 78   Ht 5\' 11"  (1.803 m)   Wt 232 lb (105.2 kg)   SpO2 99%   BMI 32.36 kg/m  Wt Readings from Last 3 Encounters:  08/16/19 232 lb (105.2 kg)  08/12/19 241 lb 1.6 oz (109.4 kg)  02/08/19 224 lb (101.6 kg)   Constitutional: overweight, in NAD Eyes: PERRLA, EOMI, no exophthalmos ENT:  moist mucous membranes, no thyromegaly, no cervical lymphadenopathy Cardiovascular: RRR, No MRG Respiratory: CTA B Gastrointestinal: abdomen soft, NT, ND, BS+ Musculoskeletal: no deformities, strength intact in all 4 Skin: moist, warm, no rashes Neurological: no tremor with outstretched hands, DTR normal in all 4  ASSESSMENT: 1. DM2, non-insulin-dependent, uncontrolled, with long-term complications - mild CKD - ED  2. HL  3.  Obesity class I  PLAN:  1. Patient longstanding controlled, type 2 diabetes, on oral antidiabetic regimen and also weekly GLP-1 receptor agonist, with still poor control, but improving-last HbA1c from  01/2019 was 8%, decreased from 8.5%. -In the past, we had to stop Metformin due to abdominal pain and switch to Metformin ER.  He still had some abdominal pain and we had to split the dose, but he does not have problems with his anymore.  At last visit he was not checking sugars later in the day and I advised him to start doing so.  We also discussed about improving breakfast and I gave him examples about healthier breakfast.  Otherwise, we did not change his regimen. -At this visit, sugars are at goal in the morning with few exceptions, with dietary indiscretions.  We did discuss about stopping juice completely.  He had one instance of a low blood sugar at 70 after dinner.  He did not check many sugars later in the day and I advised him to start checking these more frequently, rotating check times -His HbA1c today is exceptionally improved, at 5.9%, so I suspect that he has more low blood sugars.  We can try to stop glipizide. - I suggested to:  Patient Instructions  Please continue: - Metformin ER 500 mg 2x a day with meals - Farxiga 10 mg before breakfast - Ozempic 0.5 mg weekly in am   Please stop Glipizide.  Please return in 3-4 months with your sugar log.   - advised to check sugars at different times of the day - 1x a day, rotating check times - advised for  yearly eye exams >> he is UTD - return to clinic in 3-4 months  2. HL -Reviewed latest lipid panel from 01/2019: Fractions at goal with exception of a slightly low HDL Lab Results  Component Value Date   CHOL 123 02/08/2019   HDL 38 (L) 02/08/2019   LDLCALC 66 02/08/2019   TRIG 103 02/08/2019   CHOLHDL 3.2 02/08/2019  -He is not on a statin  3.  Obesity class I -Continue SGLT2 inhibitor and GLP-1 receptor agonist, which should also help with weight loss -He gained 4-5 pounds before last visit, then gained 8  lbs since last OV  Philemon Kingdom, MD PhD [facility]

## 2019-08-16 NOTE — Patient Instructions (Addendum)
Please continue: - Metformin ER 500 mg 2x a day with meals - Farxiga 10 mg before breakfast - Ozempic 0.5 mg weekly in am   Please stop Glipizide.  Please return in 3-4 months with your sugar log.

## 2019-08-16 NOTE — Addendum Note (Signed)
Addended by: Cardell Peach I on: 08/16/2019 03:59 PM   Modules accepted: Orders

## 2019-09-17 ENCOUNTER — Other Ambulatory Visit: Payer: Self-pay | Admitting: Family Medicine

## 2019-09-17 DIAGNOSIS — I10 Essential (primary) hypertension: Secondary | ICD-10-CM

## 2019-09-17 DIAGNOSIS — IMO0002 Reserved for concepts with insufficient information to code with codable children: Secondary | ICD-10-CM

## 2019-09-17 DIAGNOSIS — E1151 Type 2 diabetes mellitus with diabetic peripheral angiopathy without gangrene: Secondary | ICD-10-CM

## 2019-09-17 NOTE — Telephone Encounter (Signed)
Pt's wife called states they are new to [facility]  & got the letter Dr. Raliegh Scarlet is leaving worried that his Rxs will run out, assured them w/ have a Provider in place that is credentialed to care for Pt as well are order Test & authorize Rx refills. --- Pt is requesting refills on :   1)--- ACCU-CHEK GUIDE test strip [id number]   Order Details Dose, Route, Frequency: As Directed  Dispense Quantity: 100 strip Refills: 1       Sig: USE AS DIRECTED ONCE A DAY. DX CODE: E11.8        2)--- FARXIGA 10 MG TABS tablet [id number]   Order Details Dose, Route, Frequency: As Directed  Dispense Quantity: 90 tablet Refills: 1       Sig: TAKE 1 TABLET BY MOUTH EVERY DAY        3)--- valsartan-hydrochlorothiazide (DIOVAN-HCT) 320-12.5 MG tablet [id number]   Order Details Dose, Route, Frequency: As Directed  Dispense Quantity: 90 tablet Refills: 0       Sig: TAKE 1 TABLET BY MOUTH DAILY. NEEDS OV/FOLLOW UP BEFORE ANY MORE REFILLS    Patient uses ---    [id number] [address], [place]. [phone] (Phone) [phone] (Fax)   --Pls call patient if there are any questions or concerns.  --glh

## 2019-09-17 NOTE — Telephone Encounter (Signed)
Patient last seen 08/12/19 and advised to follow up for CPE.   These meds have never been prescribed by you. Please approve if appropriate. AS, CMA

## 2019-09-18 ENCOUNTER — Other Ambulatory Visit: Payer: Self-pay | Admitting: Family Medicine

## 2019-09-18 DIAGNOSIS — I10 Essential (primary) hypertension: Secondary | ICD-10-CM

## 2019-09-18 DIAGNOSIS — E1151 Type 2 diabetes mellitus with diabetic peripheral angiopathy without gangrene: Secondary | ICD-10-CM

## 2019-09-18 DIAGNOSIS — IMO0002 Reserved for concepts with insufficient information to code with codable children: Secondary | ICD-10-CM

## 2019-09-19 MED ORDER — VALSARTAN-HYDROCHLOROTHIAZIDE 320-12.5 MG PO TABS: ORAL_TABLET | ORAL | 0 refills | Status: DC

## 2019-09-19 NOTE — Telephone Encounter (Signed)
Per the notes and pt's chart, he has an endo physician treating his DM- thus he will have to get those meds/ supplies etc from them.  However, it is OK to give pt 30d supply of his bp meds- Diovan only-  since pt was told to f/up for CPE and FBW etc.

## 2019-09-20 ENCOUNTER — Inpatient Hospital Stay: Payer: BC Managed Care – PPO | Attending: Oncology

## 2019-09-20 ENCOUNTER — Inpatient Hospital Stay: Payer: BC Managed Care – PPO | Admitting: Oncology

## 2019-09-20 ENCOUNTER — Other Ambulatory Visit: Payer: Self-pay

## 2019-09-20 VITALS — BP 139/71 | HR 91 | Temp 97.8°F | Resp 20 | Ht 71.0 in | Wt 231.3 lb

## 2019-09-20 DIAGNOSIS — Z79899 Other long term (current) drug therapy: Secondary | ICD-10-CM | POA: Diagnosis not present

## 2019-09-20 DIAGNOSIS — C8280 Other types of follicular lymphoma, unspecified site: Secondary | ICD-10-CM

## 2019-09-20 DIAGNOSIS — Z7901 Long term (current) use of anticoagulants: Secondary | ICD-10-CM | POA: Insufficient documentation

## 2019-09-20 DIAGNOSIS — Z7984 Long term (current) use of oral hypoglycemic drugs: Secondary | ICD-10-CM | POA: Insufficient documentation

## 2019-09-20 DIAGNOSIS — I4891 Unspecified atrial fibrillation: Secondary | ICD-10-CM | POA: Insufficient documentation

## 2019-09-20 LAB — CBC WITH DIFFERENTIAL (CANCER CENTER ONLY)
Abs Immature Granulocytes: 0.01 10*3/uL (ref 0.00–0.07)
Basophils Absolute: 0.1 10*3/uL (ref 0.0–0.1)
Basophils Relative: 1 %
Eosinophils Absolute: 0.4 10*3/uL (ref 0.0–0.5)
Eosinophils Relative: 5 %
HCT: 46 % (ref 39.0–52.0)
Hemoglobin: 16.2 g/dL (ref 13.0–17.0)
Immature Granulocytes: 0 %
Lymphocytes Relative: 28 %
Lymphs Abs: 2.2 10*3/uL (ref 0.7–4.0)
MCH: 32 pg (ref 26.0–34.0)
MCHC: 35.2 g/dL (ref 30.0–36.0)
MCV: 90.9 fL (ref 80.0–100.0)
Monocytes Absolute: 0.7 10*3/uL (ref 0.1–1.0)
Monocytes Relative: 9 %
Neutro Abs: 4.5 10*3/uL (ref 1.7–7.7)
Neutrophils Relative %: 57 %
Platelet Count: 186 10*3/uL (ref 150–400)
RBC: 5.06 MIL/uL (ref 4.22–5.81)
RDW: 13.2 % (ref 11.5–15.5)
WBC Count: 7.9 10*3/uL (ref 4.0–10.5)
nRBC: 0 % (ref 0.0–0.2)

## 2019-09-20 LAB — CMP (CANCER CENTER ONLY)
ALT: 24 U/L (ref 0–44)
AST: 21 U/L (ref 15–41)
Albumin: 4 g/dL (ref 3.5–5.0)
Alkaline Phosphatase: 66 U/L (ref 38–126)
Anion gap: 8 (ref 5–15)
BUN: 24 mg/dL — ABNORMAL HIGH (ref 8–23)
CO2: 29 mmol/L (ref 22–32)
Calcium: 9.8 mg/dL (ref 8.9–10.3)
Chloride: 102 mmol/L (ref 98–111)
Creatinine: 1.17 mg/dL (ref 0.61–1.24)
GFR, Est AFR Am: >60 mL/min (ref >60)
GFR, Estimated: >60 mL/min (ref >60)
Glucose, Bld: 171 mg/dL — ABNORMAL HIGH (ref 70–99)
Potassium: 4.1 mmol/L (ref 3.5–5.1)
Sodium: 139 mmol/L (ref 135–145)
Total Bilirubin: 1 mg/dL (ref 0.3–1.2)
Total Protein: 6.7 g/dL (ref 6.5–8.1)

## 2019-09-20 NOTE — Progress Notes (Signed)
Hematology and Oncology Follow Up Visit  KLAYTEN YOSHIMOTO [id number] [birth year] 69 y.o. 09/20/2019 1:45 PM    CC: [name] [name], MD  [name], [name]  [name], [name]: 69 year old man with follicular lymphoma diagnosed in May 2010.  He was found to have stage IIIA disease at that time.  Past Therapy:  He had a period of observation to be 2010 and 2013.  He is S/P Bendamustine and rituximab started on 08/25/11 for 6 cycles completed in 12/2011 and achieved complete response at this time.  Current therapy: Active surveillance.  Interim History:  Mr. Vota returns today for a repeat evaluation.  Since the last visit, he reports no major changes in his health.  He denies any painful adenopathy, constitutional symptoms or excessive fatigue or tiredness.  Formal status and quality of life remained reasonable.  Continues to work full-time.    Medications: Reviewed without changes.  Current Outpatient Medications  Medication Sig Dispense Refill  . Accu-Chek FastClix Lancets MISC USE AS DIRECTED ONCE A DAY. DX CODE: E11.8 102 each 1  . ACCU-CHEK GUIDE test strip USE AS DIRECTED ONCE A DAY. DX CODE: E11.8 100 strip 1  . apixaban (ELIQUIS) 5 MG TABS tablet Take 1 tablet (5 mg total) by mouth 2 (two) times daily. 180 tablet 3  . FARXIGA 10 MG TABS tablet TAKE 1 TABLET BY MOUTH EVERY DAY 90 tablet 1  . metFORMIN (GLUCOPHAGE-XR) 500 MG 24 hr tablet Take 1 tablet (500 mg total) by mouth 2 (two) times daily after a meal. Hold until pt. needs it 180 tablet 3  . metoprolol tartrate (LOPRESSOR) 25 MG tablet Take 1 tablet (25 mg total) by mouth 2 (two) times daily. 180 tablet 3  . OZEMPIC, 0.25 OR 0.5 MG/DOSE, 2 MG/1.5ML SOPN INJECT 0.5 MG INTO THE SKIN ONCE A WEEK. 3 pen 1  . valsartan-hydrochlorothiazide (DIOVAN-HCT) 320-12.5 MG tablet TAKE 1 TABLET BY MOUTH DAILY.**PATIENT NEEDS APT FOR FURTHER REFILLS** 30 tablet 0   No current facility-administered medications for  this visit.    Allergies:  Allergies  Allergen Reactions  . Ampicillin Rash        Physical Exam:  Blood pressure 139/71, pulse 91, temperature 97.8 F (36.6 C), temperature source Temporal, resp. rate 20, height 5\' 11"  (1.803 m), weight 231 lb 4.8 oz (104.9 kg), SpO2 99 %.   ECOG: 0    General appearance: Comfortable appearing without any discomfort Head: Normocephalic without any trauma Oropharynx: Mucous membranes are moist and pink without any thrush or ulcers. Eyes: Pupils are equal and round reactive to light. Lymph nodes: No cervical, supraclavicular, inguinal or axillary lymphadenopathy.   Heart: Regular without any murmurs or gallops. Lung: Clear without any rhonchi or wheezes.  No dullness to percussion. Abdomin: Soft, nontender, nondistended with good bowel sounds.  No hepatosplenomegaly. Musculoskeletal: No joint deformity or effusion.  Full range of motion noted. Neurological: No deficits noted on motor, sensory and deep tendon reflex exam. Skin: No petechial rash or dryness.  Appeared moist.     Lab Results: Lab Results  Component Value Date   WBC 7.9 09/20/2019   HGB 16.2 09/20/2019   HCT 46.0 09/20/2019   MCV 90.9 09/20/2019   PLT 186 09/20/2019       Impression and Plan:  69 year old old man with:   1.  Stage IIIa follicular lymphoma remains in remission since his treatment in 2013.  He was initially diagnosed in 2010.    He  continues to be on active surveillance without any evidence of relapsed disease.  The natural course of disease as well as risk of relapse was assessed.  Salvage therapy options were discussed if he develops relapsed disease at that time.  These would include systemic chemotherapy, oral targeted therapy with ibrutinib among others.  For the time being I recommended continued active surveillance without any need for intervention.  Imaging studies will be done if he develops any specific symptoms.   2. Atrial fibrillation:  Continues to follow with cardiology with rate control at this time.  3.  Covid vaccination considerations: I urged him to obtain that vaccination soon as he can.     4.  Follow-up: In 8 months for a follow-up.  30  minutes were dedicated to this visit. The time was spent on reviewing laboratory data,discussing treatment options, and answering questions regarding future plan.     Zola Button 4/23/20211:45 PM

## 2019-09-23 ENCOUNTER — Telehealth: Payer: Self-pay | Admitting: Oncology

## 2019-09-23 NOTE — Telephone Encounter (Signed)
Scheduled appt per 4/23 los.  Spoke with a household family member and they are aware of the appt date and time

## 2019-09-25 ENCOUNTER — Encounter: Payer: Self-pay | Admitting: Family Medicine

## 2019-09-25 ENCOUNTER — Ambulatory Visit (INDEPENDENT_AMBULATORY_CARE_PROVIDER_SITE_OTHER): Payer: BC Managed Care – PPO | Admitting: Family Medicine

## 2019-09-25 ENCOUNTER — Other Ambulatory Visit: Payer: Self-pay

## 2019-09-25 ENCOUNTER — Telehealth: Payer: Self-pay | Admitting: Family Medicine

## 2019-09-25 VITALS — BP 156/90 | HR 87 | Temp 97.8°F | Ht 70.0 in | Wt 231.7 lb

## 2019-09-25 DIAGNOSIS — L84 Corns and callosities: Secondary | ICD-10-CM

## 2019-09-25 DIAGNOSIS — E1151 Type 2 diabetes mellitus with diabetic peripheral angiopathy without gangrene: Secondary | ICD-10-CM | POA: Diagnosis not present

## 2019-09-25 DIAGNOSIS — E1165 Type 2 diabetes mellitus with hyperglycemia: Secondary | ICD-10-CM

## 2019-09-25 DIAGNOSIS — IMO0002 Reserved for concepts with insufficient information to code with codable children: Secondary | ICD-10-CM | POA: Insufficient documentation

## 2019-09-25 DIAGNOSIS — E1169 Type 2 diabetes mellitus with other specified complication: Secondary | ICD-10-CM | POA: Diagnosis not present

## 2019-09-25 DIAGNOSIS — Z719 Counseling, unspecified: Secondary | ICD-10-CM

## 2019-09-25 DIAGNOSIS — I10 Essential (primary) hypertension: Secondary | ICD-10-CM

## 2019-09-25 DIAGNOSIS — Z1211 Encounter for screening for malignant neoplasm of colon: Secondary | ICD-10-CM

## 2019-09-25 DIAGNOSIS — E1159 Type 2 diabetes mellitus with other circulatory complications: Secondary | ICD-10-CM

## 2019-09-25 DIAGNOSIS — Z0001 Encounter for general adult medical examination with abnormal findings: Secondary | ICD-10-CM

## 2019-09-25 DIAGNOSIS — E785 Hyperlipidemia, unspecified: Secondary | ICD-10-CM

## 2019-09-25 DIAGNOSIS — I480 Paroxysmal atrial fibrillation: Secondary | ICD-10-CM

## 2019-09-25 DIAGNOSIS — I152 Hypertension secondary to endocrine disorders: Secondary | ICD-10-CM

## 2019-09-25 MED ORDER — ACCU-CHEK FASTCLIX LANCETS MISC: 1 refills | Status: DC

## 2019-09-25 MED ORDER — ACCU-CHEK GUIDE VI STRP: ORAL_STRIP | 1 refills | Status: DC

## 2019-09-25 MED ORDER — FARXIGA 10 MG PO TABS: 10.0000 mg | ORAL_TABLET | Freq: Every day | ORAL | 1 refills | Status: DC

## 2019-09-25 MED ORDER — VALSARTAN-HYDROCHLOROTHIAZIDE 320-12.5 MG PO TABS: ORAL_TABLET | ORAL | 0 refills | Status: DC

## 2019-09-25 NOTE — Telephone Encounter (Signed)
Per Dr. Raliegh Scarlet she will not refill this medication, she said he would need to call Endo provider for refills.  Patient has been advised of this and verbalized understanding. AS, CMA

## 2019-09-25 NOTE — Patient Instructions (Addendum)
BP should be consistently less than 140/90.  If it does not remain this at home on regular basis, please f/up sooner then planned     Preventive Care 65 Years and Older, Male Preventive care refers to lifestyle choices and visits with your health care provider that can promote health and wellness. What does preventive care include?   A yearly physical exam. This is also called an annual well check.  Dental exams once or twice a year.  Routine eye exams. Ask your health care provider how often you should have your eyes checked.  Personal lifestyle choices, including: ? Daily care of your teeth and gums. ? Regular physical activity. ? Eating a healthy diet. ? Avoiding tobacco and drug use. ? Limiting alcohol use. ? Practicing safe sex. ? Taking low doses of aspirin every day. ? Taking vitamin and mineral supplements as recommended by your health care provider. What happens during an annual well check? The services and screenings done by your health care provider during your annual well check will depend on your age, overall health, lifestyle risk factors, and family history of disease. Counseling Your health care provider may ask you questions about your:  Alcohol use.  Tobacco use.  Drug use.  Emotional well-being.  Home and relationship well-being.  Sexual activity.  Eating habits.  History of falls.  Memory and ability to understand (cognition).  Work and work Statistician. Screening You may have the following tests or measurements:  Height, weight, and BMI.  Blood pressure.  Lipid and cholesterol levels. These may be checked every 5 years, or more frequently if you are over 38 years old.  Skin check.  Lung cancer screening. You may have this screening every year starting at age 71 if you have a 30-pack-year history of smoking and currently smoke or have quit within the past 15 years.  Colorectal cancer screening. All adults should have this screening  starting at age 74 and continuing until age 78. You will have tests every 1-10 years, depending on your results and the type of screening test. People at increased risk should start screening at an earlier age. Screening tests may include: ? Guaiac-based fecal occult blood testing. ? Fecal immunochemical test (FIT). ? Stool DNA test. ? Virtual colonoscopy. ? Sigmoidoscopy. During this test, a flexible tube with a tiny camera (sigmoidoscope) is used to examine your rectum and lower colon. The sigmoidoscope is inserted through your anus into your rectum and lower colon. ? Colonoscopy. During this test, a long, thin, flexible tube with a tiny camera (colonoscope) is used to examine your entire colon and rectum.  Prostate cancer screening. Recommendations will vary depending on your family history and other risks.  Hepatitis C blood test.  Hepatitis B blood test.  Sexually transmitted disease (STD) testing.  Diabetes screening. This is done by checking your blood sugar (glucose) after you have not eaten for a while (fasting). You may have this done every 1-3 years.  Abdominal aortic aneurysm (AAA) screening. You may need this if you are a current or former smoker.  Osteoporosis. You may be screened starting at age 5 if you are at high risk. Talk with your health care provider about your test results, treatment options, and if necessary, the need for more tests. Vaccines Your health care provider may recommend certain vaccines, such as:  Influenza vaccine. This is recommended every year.  Tetanus, diphtheria, and acellular pertussis (Tdap, Td) vaccine. You may need a Td booster every 10 years.  Varicella  vaccine. You may need this if you have not been vaccinated.  Zoster vaccine. You may need this after age 9.  Measles, mumps, and rubella (MMR) vaccine. You may need at least one dose of MMR if you were born in 1957 or later. You may also need a second dose.  Pneumococcal 13-valent  conjugate (PCV13) vaccine. One dose is recommended after age 63.  Pneumococcal polysaccharide (PPSV23) vaccine. One dose is recommended after age 26.  Meningococcal vaccine. You may need this if you have certain conditions.  Hepatitis A vaccine. You may need this if you have certain conditions or if you travel or work in places where you may be exposed to hepatitis A.  Hepatitis B vaccine. You may need this if you have certain conditions or if you travel or work in places where you may be exposed to hepatitis B.  Haemophilus influenzae type b (Hib) vaccine. You may need this if you have certain risk factors. Talk to your health care provider about which screenings and vaccines you need and how often you need them. This information is not intended to replace advice given to you by your health care provider. Make sure you discuss any questions you have with your health care provider. Document Released: 06/12/2015 Document Revised: 07/06/2017 Document Reviewed: 03/17/2015 Elsevier Interactive Patient Education  2019 [place] for Adults, Male A healthy lifestyle and preventive care can promote health and wellness. Preventive health guidelines for men include the following key practices:  A routine yearly physical is a good way to check with your health care provider about your health and preventative screening. It is a chance to share any concerns and updates on your health and to receive a thorough exam.  Visit your dentist for a routine exam and preventative care every 6 months. Brush your teeth twice a day and floss once a day. Good oral hygiene prevents tooth decay and gum disease.  The frequency of eye exams is based on your age, health, family medical history, use of contact lenses, and other factors. Follow your health care provider's recommendations for frequency of eye exams.  Eat a healthy diet. Foods such as vegetables, fruits, whole grains, low-fat  dairy products, and lean protein foods contain the nutrients you need without too many calories. Decrease your intake of foods high in solid fats, added sugars, and salt. Eat the right amount of calories for you. Get information about a proper diet from your health care provider, if necessary.  Regular physical exercise is one of the most important things you can do for your health. Most adults should get at least 150 minutes of moderate-intensity exercise (any activity that increases your heart rate and causes you to sweat) each week. In addition, most adults need muscle-strengthening exercises on 2 or more days a week.  Maintain a healthy weight. The body mass index (BMI) is a screening tool to identify possible weight problems. It provides an estimate of body fat based on height and weight. Your health care provider can find your BMI and can help you achieve or maintain a healthy weight. For adults 20 years and older:  A BMI below 18.5 is considered underweight.  A BMI of 18.5 to 24.9 is normal.  A BMI of 25 to 29.9 is considered overweight.  A BMI of 30 and above is considered obese.  Maintain normal blood lipids and cholesterol levels by exercising and minimizing your intake of saturated fat. Eat  a balanced diet with plenty of fruit and vegetables. Blood tests for lipids and cholesterol should begin at age 72 and be repeated every 5 years. If your lipid or cholesterol levels are high, you are over 50, or you are at high risk for heart disease, you may need your cholesterol levels checked more frequently. Ongoing high lipid and cholesterol levels should be treated with medicines if diet and exercise are not working.  If you smoke, find out from your health care provider how to quit. If you do not use tobacco, do not start.  Lung cancer screening is recommended for adults aged 42-80 years who are at high risk for developing lung cancer because of a history of smoking. A yearly low-dose CT scan  of the lungs is recommended for people who have at least a 30-pack-year history of smoking and are a current smoker or have quit within the past 15 years. A pack year of smoking is smoking an average of 1 pack of cigarettes a day for 1 year (for example: 1 pack a day for 30 years or 2 packs a day for 15 years). Yearly screening should continue until the smoker has stopped smoking for at least 15 years. Yearly screening should be stopped for people who develop a health problem that would prevent them from having lung cancer treatment.  If you choose to drink alcohol, do not have more than 2 drinks per day. One drink is considered to be 12 ounces (355 mL) of beer, 5 ounces (148 mL) of wine, or 1.5 ounces (44 mL) of liquor.  Avoid use of street drugs. Do not share needles with anyone. Ask for help if you need support or instructions about stopping the use of drugs.  High blood pressure causes heart disease and increases the risk of stroke. Your blood pressure should be checked at least every 1-2 years. Ongoing high blood pressure should be treated with medicines, if weight loss and exercise are not effective.  If you are 40-18 years old, ask your health care provider if you should take aspirin to prevent heart disease.  Diabetes screening is done by taking a blood sample to check your blood glucose level after you have not eaten for a certain period of time (fasting). If you are not overweight and you do not have risk factors for diabetes, you should be screened once every 3 years starting at age 38. If you are overweight or obese and you are 65-70 years of age, you should be screened for diabetes every year as part of your cardiovascular risk assessment.  Colorectal cancer can be detected and often prevented. Most routine colorectal cancer screening begins at the age of 53 and continues through age 66. However, your health care provider may recommend screening at an earlier age if you have risk factors for  colon cancer. On a yearly basis, your health care provider may provide home test kits to check for hidden blood in the stool. Use of a small camera at the end of a tube to directly examine the colon (sigmoidoscopy or colonoscopy) can detect the earliest forms of colorectal cancer. Talk to your health care provider about this at age 38, when routine screening begins. Direct exam of the colon should be repeated every 5-10 years through age 53, unless early forms of precancerous polyps or small growths are found.  People who are at an increased risk for hepatitis B should be screened for this virus. You are considered at high risk for  hepatitis B if:  You were born in a country where hepatitis B occurs often. Talk with your health care provider about which countries are considered high risk.  Your parents were born in a high-risk country and you have not received a shot to protect against hepatitis B (hepatitis B vaccine).  You have HIV or AIDS.  You use needles to inject street drugs.  You live with, or have sex with, someone who has hepatitis B.  You are a man who has sex with other men (MSM).  You get hemodialysis treatment.  You take certain medicines for conditions such as cancer, organ transplantation, and autoimmune conditions.  Hepatitis C blood testing is recommended for all people born from [birth year] through 1965 and any individual with known risks for hepatitis C.  Practice safe sex. Use condoms and avoid high-risk sexual practices to reduce the spread of sexually transmitted infections (STIs). STIs include gonorrhea, chlamydia, syphilis, trichomonas, herpes, HPV, and human immunodeficiency virus (HIV). Herpes, HIV, and HPV are viral illnesses that have no cure. They can result in disability, cancer, and death.  If you are a man who has sex with other men, you should be screened at least once per year for:  HIV.  Urethral, rectal, and pharyngeal infection of gonorrhea, chlamydia, or  both.  If you are at risk of being infected with HIV, it is recommended that you take a prescription medicine daily to prevent HIV infection. This is called preexposure prophylaxis (PrEP). You are considered at risk if:  You are a man who has sex with other men (MSM) and have other risk factors.  You are a heterosexual man, are sexually active, and are at increased risk for HIV infection.  You take drugs by injection.  You are sexually active with a partner who has HIV.  Talk with your health care provider about whether you are at high risk of being infected with HIV. If you choose to begin PrEP, you should first be tested for HIV. You should then be tested every 3 months for as long as you are taking PrEP.  A one-time screening for abdominal aortic aneurysm (AAA) and surgical repair of large AAAs by ultrasound are recommended for men ages 8 to 84 years who are current or former smokers.  Healthy men should no longer receive prostate-specific antigen (PSA) blood tests as part of routine cancer screening. Talk with your health care provider about prostate cancer screening.  Testicular cancer screening is not recommended for adult males who have no symptoms. Screening includes self-exam, a health care provider exam, and other screening tests. Consult with your health care provider about any symptoms you have or any concerns you have about testicular cancer.  Use sunscreen. Apply sunscreen liberally and repeatedly throughout the day. You should seek shade when your shadow is shorter than you. Protect yourself by wearing long sleeves, pants, a wide-brimmed hat, and sunglasses year round, whenever you are outdoors.  Once a month, do a whole-body skin exam, using a mirror to look at the skin on your back. Tell your health care provider about new moles, moles that have irregular borders, moles that are larger than a pencil eraser, or moles that have changed in shape or color.  Stay current with  required vaccines (immunizations).  Influenza vaccine. All adults should be immunized every year.  Tetanus, diphtheria, and acellular pertussis (Td, Tdap) vaccine. An adult who has not previously received Tdap or who does not know his vaccine status should receive 1  dose of Tdap. This initial dose should be followed by tetanus and diphtheria toxoids (Td) booster doses every 10 years. Adults with an unknown or incomplete history of completing a 3-dose immunization series with Td-containing vaccines should begin or complete a primary immunization series including a Tdap dose. Adults should receive a Td booster every 10 years.  Varicella vaccine. An adult without evidence of immunity to varicella should receive 2 doses or a second dose if he has previously received 1 dose.  Human papillomavirus (HPV) vaccine. Males aged 11-21 years who have not received the vaccine previously should receive the 3-dose series. Males aged 22-26 years may be immunized. Immunization is recommended through the age of 36 years for any male who has sex with males and did not get any or all doses earlier. Immunization is recommended for any person with an immunocompromised condition through the age of 76 years if he did not get any or all doses earlier. During the 3-dose series, the second dose should be obtained 4-8 weeks after the first dose. The third dose should be obtained 24 weeks after the first dose and 16 weeks after the second dose.  Zoster vaccine. One dose is recommended for adults aged 10 years or older unless certain conditions are present.  Measles, mumps, and rubella (MMR) vaccine. Adults born before 30 generally are considered immune to measles and mumps. Adults born in [birth year] or later should have 1 or more doses of MMR vaccine unless there is a contraindication to the vaccine or there is laboratory evidence of immunity to each of the three diseases. A routine second dose of MMR vaccine should be obtained at least  28 days after the first dose for students attending postsecondary schools, health care workers, or international travelers. People who received inactivated measles vaccine or an unknown type of measles vaccine during 1963-1967 should receive 2 doses of MMR vaccine. People who received inactivated mumps vaccine or an unknown type of mumps vaccine before 1979 and are at high risk for mumps infection should consider immunization with 2 doses of MMR vaccine. Unvaccinated health care workers born before 30 who lack laboratory evidence of measles, mumps, or rubella immunity or laboratory confirmation of disease should consider measles and mumps immunization with 2 doses of MMR vaccine or rubella immunization with 1 dose of MMR vaccine.  Pneumococcal 13-valent conjugate (PCV13) vaccine. When indicated, a person who is uncertain of his immunization history and has no record of immunization should receive the PCV13 vaccine. All adults 60 years of age and older should receive this vaccine. An adult aged 83 years or older who has certain medical conditions and has not been previously immunized should receive 1 dose of PCV13 vaccine. This PCV13 should be followed with a dose of pneumococcal polysaccharide (PPSV23) vaccine. Adults who are at high risk for pneumococcal disease should obtain the PPSV23 vaccine at least 8 weeks after the dose of PCV13 vaccine. Adults older than 68 years of age who have normal immune system function should obtain the PPSV23 vaccine dose at least 1 year after the dose of PCV13 vaccine.  Pneumococcal polysaccharide (PPSV23) vaccine. When PCV13 is also indicated, PCV13 should be obtained first. All adults aged 83 years and older should be immunized. An adult younger than age 70 years who has certain medical conditions should be immunized. Any person who resides in a nursing home or long-term care facility should be immunized. An adult smoker should be immunized. People with an immunocompromised  condition and certain other  conditions should receive both PCV13 and PPSV23 vaccines. People with human immunodeficiency virus (HIV) infection should be immunized as soon as possible after diagnosis. Immunization during chemotherapy or radiation therapy should be avoided. Routine use of PPSV23 vaccine is not recommended for American Indians, [place] Natives, or people younger than 65 years unless there are medical conditions that require PPSV23 vaccine. When indicated, people who have unknown immunization and have no record of immunization should receive PPSV23 vaccine. One-time revaccination 5 years after the first dose of PPSV23 is recommended for people aged 19-64 years who have chronic kidney failure, nephrotic syndrome, asplenia, or immunocompromised conditions. People who received 1-2 doses of PPSV23 before age 60 years should receive another dose of PPSV23 vaccine at age 57 years or later if at least 5 years have passed since the previous dose. Doses of PPSV23 are not needed for people immunized with PPSV23 at or after age 28 years.  Meningococcal vaccine. Adults with asplenia or persistent complement component deficiencies should receive 2 doses of quadrivalent meningococcal conjugate (MenACWY-D) vaccine. The doses should be obtained at least 2 months apart. Microbiologists working with certain meningococcal bacteria, [place] recruits, people at risk during an outbreak, and people who travel to or live in countries with a high rate of meningitis should be immunized. A first-year college student up through age 40 years who is living in a residence hall should receive a dose if he did not receive a dose on or after his 16th birthday. Adults who have certain high-risk conditions should receive one or more doses of vaccine.  Hepatitis A vaccine. Adults who wish to be protected from this disease, have chronic liver disease, work with hepatitis A-infected animals, work in hepatitis A research labs, or travel  to or work in countries with a high rate of hepatitis A should be immunized. Adults who were previously unvaccinated and who anticipate close contact with an international adoptee during the first 60 days after arrival in the [place] from a country with a high rate of hepatitis A should be immunized.  Hepatitis B vaccine. Adults should be immunized if they wish to be protected from this disease, are under age 49 years and have diabetes, have chronic liver disease, have had more than one sex partner in the past 6 months, may be exposed to blood or other infectious body fluids, are household contacts or sex partners of hepatitis B positive people, are clients or workers in certain care facilities, or travel to or work in countries with a high rate of hepatitis B.  Haemophilus influenzae type b (Hib) vaccine. A previously unvaccinated person with asplenia or sickle cell disease or having a scheduled splenectomy should receive 1 dose of Hib vaccine. Regardless of previous immunization, a recipient of a hematopoietic stem cell transplant should receive a 3-dose series 6-12 months after his successful transplant. Hib vaccine is not recommended for adults with HIV infection. Preventive Service / Frequency Ages 70 to 67  Blood pressure check.** / Every 3-5 years.  Lipid and cholesterol check.** / Every 5 years beginning at age 26.  Hepatitis C blood test.** / For any individual with known risks for hepatitis C.  Skin self-exam. / Monthly.  Influenza vaccine. / Every year.  Tetanus, diphtheria, and acellular pertussis (Tdap, Td) vaccine.** / Consult your health care provider. 1 dose of Td every 10 years.  Varicella vaccine.** / Consult your health care provider.  HPV vaccine. / 3 doses over 6 months, if 36 or younger.  Measles, mumps,  rubella (MMR) vaccine.** / You need at least 1 dose of MMR if you were born in 1957 or later. You may also need a second dose.  Pneumococcal 13-valent conjugate  (PCV13) vaccine.** / Consult your health care provider.  Pneumococcal polysaccharide (PPSV23) vaccine.** / 1 to 2 doses if you smoke cigarettes or if you have certain conditions.  Meningococcal vaccine.** / 1 dose if you are age 64 to 57 years and a Market researcher living in a residence hall, or have one of several medical conditions. You may also need additional booster doses.  Hepatitis A vaccine.** / Consult your health care provider.  Hepatitis B vaccine.** / Consult your health care provider.  Haemophilus influenzae type b (Hib) vaccine.** / Consult your health care provider. Ages 50 to 33  Blood pressure check.** / Every year.  Lipid and cholesterol check.** / Every 5 years beginning at age 34.  Lung cancer screening. / Every year if you are aged 38-80 years and have a 30-pack-year history of smoking and currently smoke or have quit within the past 15 years. Yearly screening is stopped once you have quit smoking for at least 15 years or develop a health problem that would prevent you from having lung cancer treatment.  Fecal occult blood test (FOBT) of stool. / Every year beginning at age 30 and continuing until age 66. You may not have to do this test if you get a colonoscopy every 10 years.  Flexible sigmoidoscopy** or colonoscopy.** / Every 5 years for a flexible sigmoidoscopy or every 10 years for a colonoscopy beginning at age 85 and continuing until age 54.  Hepatitis C blood test.** / For all people born from [birth year] through 1965 and any individual with known risks for hepatitis C.  Skin self-exam. / Monthly.  Influenza vaccine. / Every year.  Tetanus, diphtheria, and acellular pertussis (Tdap/Td) vaccine.** / Consult your health care provider. 1 dose of Td every 10 years.  Varicella vaccine.** / Consult your health care provider.  Zoster vaccine.** / 1 dose for adults aged 69 years or older.  Measles, mumps, rubella (MMR) vaccine.** / You need at least 1 dose  of MMR if you were born in 1957 or later. You may also need a second dose.  Pneumococcal 13-valent conjugate (PCV13) vaccine.** / Consult your health care provider.  Pneumococcal polysaccharide (PPSV23) vaccine.** / 1 to 2 doses if you smoke cigarettes or if you have certain conditions.  Meningococcal vaccine.** / Consult your health care provider.  Hepatitis A vaccine.** / Consult your health care provider.  Hepatitis B vaccine.** / Consult your health care provider.  Haemophilus influenzae type b (Hib) vaccine.** / Consult your health care provider. Ages 84 and over  Blood pressure check.** / Every year.  Lipid and cholesterol check.**/ Every 5 years beginning at age 33.  Lung cancer screening. / Every year if you are aged 33-80 years and have a 30-pack-year history of smoking and currently smoke or have quit within the past 15 years. Yearly screening is stopped once you have quit smoking for at least 15 years or develop a health problem that would prevent you from having lung cancer treatment.  Fecal occult blood test (FOBT) of stool. / Every year beginning at age 82 and continuing until age 59. You may not have to do this test if you get a colonoscopy every 10 years.  Flexible sigmoidoscopy** or colonoscopy.** / Every 5 years for a flexible sigmoidoscopy or every 10 years for a colonoscopy beginning  at age 59 and continuing until age 69.  Hepatitis C blood test.** / For all people born from [birth year] through 1964 and any individual with known risks for hepatitis C.  Abdominal aortic aneurysm (AAA) screening.** / A one-time screening for ages 20 to 53 years who are current or former smokers.  Skin self-exam. / Monthly.  Influenza vaccine. / Every year.  Tetanus, diphtheria, and acellular pertussis (Tdap/Td) vaccine.** / 1 dose of Td every 10 years.  Varicella vaccine.** / Consult your health care provider.  Zoster vaccine.** / 1 dose for adults aged 60 years or  older.  Pneumococcal 13-valent conjugate (PCV13) vaccine.** / 1 dose for all adults aged 46 years and older.  Pneumococcal polysaccharide (PPSV23) vaccine.** / 1 dose for all adults aged 16 years and older.  Meningococcal vaccine.** / Consult your health care provider.  Hepatitis A vaccine.** / Consult your health care provider.  Hepatitis B vaccine.** / Consult your health care provider.  Haemophilus influenzae type b (Hib) vaccine.** / Consult your health care provider. **Family history and personal history of risk and conditions may change your health care provider's recommendations.   This information is not intended to replace advice given to you by your health care provider. Make sure you discuss any questions you have with your health care provider.   Document Released: 07/12/2001 Document Revised: 06/06/2014 Document Reviewed: 10/11/2010 Elsevier Interactive Patient Education [employer].

## 2019-09-25 NOTE — Telephone Encounter (Signed)
Patient was seen to day and thought he was going to have a refill of [facility] [address] but they have not received an order from our office and he is currently out of this med. Please place order is applicable.

## 2019-09-25 NOTE — Progress Notes (Signed)
Male physical  Impression and Recommendations:    1. Encounter for general adult medical examination with abnormal findings   2. Essential hypertension   3. DM (diabetes mellitus) type II uncontrolled, periph vascular disorder ([place])   4. Hyperlipidemia associated with type 2 diabetes mellitus ([place])   5. Hypertension associated with diabetes ([place])   6. Paroxysmal atrial fibrillation ([place])   7. Morbid obesity ([place])   8. Screening for colon cancer   9. Health education/counseling   10. Corns and callus     1) Anticipatory Guidance: Discussed skin CA prevention and sunscreen when outside along with skin surveillance; eating a balanced and modest diet; physical activity at least 25 minutes per day or minimum of 150 min/ week moderate to intense activity.  - Discussed that patient may have his ventral hernia for the rest of his life without complication.  Reviewed importance of monitoring for red flag symptoms of hernia.  - For care of feet, discussed option of referral to podiatrist today. - Referral to podiatry placed today.   2) Immunizations / Screenings / Labs:   All immunizations are up-to-date per recommendations or will be updated today if pt allows.    - Patient understands with dental and vision screens they will schedule independently.  - Will obtain CBC, CMP, HgA1c, Lipid panel, TSH and vit D when fasting, if not already done past 12 mo/ recently   - Last colonoscopy obtained 02/14/2006.  Need for repeat. - Repeat will be scheduled this year.  Referral to gastroenterology placed today.  - If patient insists on declining prostate exam, advised obtaining PSA in lab work. - Patient declines digital rectal exam and agrees to PSA check today.  - Need for shingles vaccination. - Education provided to patient and all questions answered. - Patient declines shingles vaccine at this time.  - Need for pneumonia vaccination. - Education provided to patient and all  questions answered. - Patient declines pneumonia vaccine at this time.   3) Preventative Maintenance & Health / Weight Counseling - BMI is 33.25 kg/m: BMI meaning discussed with patient.  Discussed goal to improve diet habits to improve overall feelings of well being and objective health data. Improve nutrient density of diet through increasing intake of fruits and vegetables and decreasing saturated fats, white flour products and refined sugars.   - Advised patient to continue working toward exercising to improve overall mental, physical, and emotional health.    - Reviewed the "spokes of the wheel" of wellbeing.  Stressed the importance of ongoing prudent habits, including regular exercise, appropriate sleep hygiene, healthful dietary habits, and prayer/meditation to relax.  - Encouraged patient to engage in daily physical activity as tolerated, especially a formal exercise routine.  Recommended that the patient eventually strive for at least 150 minutes of moderate cardiovascular activity per week according to guidelines established by the [facility].   - Healthy dietary habits encouraged, including low-carb, and high amounts of lean protein in diet.   - Patient should also consume adequate amounts of water.  - Health counseling performed.  All questions answered.    Orders Placed This Encounter  Procedures  . CBC  . Comprehensive metabolic panel    Order Specific Question:   Has the patient fasted?    Answer:   Yes  . TSH  . T4, free  . Hemoglobin A1c  . VITAMIN D 25 Hydroxy (Vit-D Deficiency, Fractures)  . Lipid panel    Order Specific Question:  Has the patient fasted?    Answer:   Yes  . Urine Microalbumin w/creat. ratio  . Ambulatory referral to Gastroenterology    Referral Priority:   Routine    Referral Type:   Consultation    Referral Reason:   Specialty Services Required    Number of Visits Requested:   1  . Ambulatory referral to Podiatry    Referral Priority:    Routine    Referral Type:   Consultation    Referral Reason:   Specialty Services Required    Requested Specialty:   Podiatry    Number of Visits Requested:   1    Meds ordered this encounter  Medications  . valsartan-hydrochlorothiazide (DIOVAN-HCT) 320-12.5 MG tablet    Sig: TAKE 1 TABLET BY MOUTH DAILY    Dispense:  90 tablet    Refill:  0  . Accu-Chek FastClix Lancets MISC    Sig: Use to check fasting blood sugar and 2 hrs after largest meal    Dispense:  102 each    Refill:  1  . glucose blood (ACCU-CHEK GUIDE) test strip    Sig: Use to check fasting blood sugar and 2 hrs after largest meal    Dispense:  100 strip    Refill:  1     Return for f/up 3 months for maintenance of chronic health issues; sooner if BP remains >er140/90.    Reminded pt important of f-up preventative CPE in 1 year.  Reminded pt again, this is in addition to any chronic care visits.    Gross side effects, risk and benefits, and alternatives of medications discussed with patient.  Patient is aware that all medications have potential side effects and we are unable to predict every side effect or drug-drug interaction that may occur.  Expresses verbal understanding and consents to current therapy plan and treatment regimen.  Please see AVS handed out to patient at the end of our visit for further patient instructions/ counseling done pertaining to today's office visit.   This case required medical decision making of at least moderate complexity.  This document serves as a record of services personally performed by Mellody Dance, DO. It was created on her behalf by Toni Amend, a trained medical scribe. The creation of this record is based on the scribe's personal observations and the provider's statements to them.   The above documentation from Toni Amend, medical scribe, has been reviewed by Marjory Sneddon, D.O.  Subjective:      I, Toni Amend, am serving as Education administrator  for [employer].    CC: CPE   HPI: Tom Fuller is a 69 y.o. male who presents to [place] [facility] today for a yearly health maintenance exam.     Health Maintenance Summary  - Reviewed and updated, unless pt declines services.  Last Cologuard or Colonoscopy:  Last obtained 02/14/2006 Family history of Colon CA:  No.  Tobacco History Reviewed:  Never smoker.  Alcohol / drug use:  No concerns, no excessive use / no use Exercise Habits:  Notes "not much."  Not meeting AHA guidelines.  Notes that sometimes it is difficult to meet AHA guidelines as he wishes to engage in physical activities with his wife, but she often does not. Dental Home:  Goes to the dentist every six months. Eye exams:  Goes yearly to the eye doctor. Dermatology home:  Does not see a dermatologist.  Does not wish to be referred to  dermatology.  Denies new spots or areas of concern on skin.  No known family history of skin cancers.   Male history: STD concerns:   none, monogamous Birth control method:   N/a  Additional penile/ urinary concerns:  No concerns with genitalia or urinary habits; reports that he does not experience nocturia. Pt expresses he prefers NOT to have exam of privates today  Dad's brother had prostate cancer, diagnosed in his 12's.  Says work is going well.  Notes "the whole world has kind of just changed.  From the day I started until now," he feels things are more lax in terms of management, etc.   Immunization History  Administered Date(s) Administered  . Influenza Inj Mdck Quad Pf 03/22/2017  . Influenza Split 03/28/2011  . Influenza, High Dose Seasonal PF 03/27/2018  . Pneumococcal Conjugate-13 07/20/2018  . Td 07/25/2007  . Tdap 01/12/2018     Health Maintenance  Topic Date Due  . OPHTHALMOLOGY EXAM  Never done  . COVID-19 Vaccine (1) Never done  . FOOT EXAM  01/13/2019  . PNA vac Low Risk Adult (2 of 2 - PPSV23) 07/21/2019  . INFLUENZA VACCINE   12/29/2019  . HEMOGLOBIN A1C  02/16/2020  . TETANUS/TDAP  01/13/2028  . Hepatitis C Screening  Completed       Wt Readings from Last 3 Encounters:  09/25/19 231 lb 11.2 oz (105.1 kg)  09/20/19 231 lb 4.8 oz (104.9 kg)  08/16/19 232 lb (105.2 kg)   BP Readings from Last 3 Encounters:  09/25/19 (!) 156/90  09/20/19 139/71  08/16/19 120/70   Pulse Readings from Last 3 Encounters:  09/25/19 87  09/20/19 91  08/16/19 78    Patient Active Problem List   Diagnosis Date Noted  . DM (diabetes mellitus) type II uncontrolled, periph vascular disorder ([place]) 09/25/2019  . Type 2 diabetes mellitus with chronic kidney disease, without long-term current use of insulin ([place]) 03/26/2016  . ATRIAL FIBRILLATION 09/09/2008  . Lymphoma ([place]) 08/07/2008  . Obesity, Class I, BMI 30.0-34.9 (see actual BMI) 07/06/2018  . Hyperlipidemia LDL goal <70 03/26/2016  . Left otitis media 06/17/2015  . Preventative health care 09/23/2010  . Enlarged lymph nodes 07/07/2008  . GERD 07/25/2007  . ERECTILE DYSFUNCTION 03/15/2007  . Essential hypertension 02/27/2007    Past Medical History:  Diagnosis Date  . Atrial fibrillation ([place])   . Cervical lymphadenopathy   . Erectile dysfunction   . GERD (gastroesophageal reflux disease)   . Hypertension   . Lymphoma, follicular ([place])    Dr.Shadad    Past Surgical History:  Procedure Laterality Date  . a bsc    . ABSCESS DRAINAGE     under rt eye   . DEEP NECK LYMPH NODE BIOPSY / EXCISION  2011  . VASECTOMY      Family History  Problem Relation Age of Onset  . Hypertension Mother   . Kidney disease Mother   . Hypertension Father   . Hypertension Brother   . Diabetes Paternal Grandfather   . Hypertension Paternal Grandfather   . Kidney disease Maternal Aunt   . Kidney disease Maternal Grandmother     Social History   Substance and Sexual Activity  Drug Use No  ,  Social History   Substance and Sexual Activity  Alcohol Use No  ,    Social History   Tobacco Use  Smoking Status Never Smoker  Smokeless Tobacco Never Used  ,  Social History   Substance and  Sexual Activity  Sexual Activity Yes  . Partners: Female    Patient's Medications  New Prescriptions   No medications on file  Previous Medications   APIXABAN (ELIQUIS) 5 MG TABS TABLET    Take 1 tablet (5 mg total) by mouth 2 (two) times daily.   FARXIGA 10 MG TABS TABLET    TAKE 1 TABLET BY MOUTH EVERY DAY   METFORMIN (GLUCOPHAGE-XR) 500 MG 24 HR TABLET    Take 1 tablet (500 mg total) by mouth 2 (two) times daily after a meal. Hold until pt. needs it   METOPROLOL TARTRATE (LOPRESSOR) 25 MG TABLET    Take 1 tablet (25 mg total) by mouth 2 (two) times daily.   OZEMPIC, 0.25 OR 0.5 MG/DOSE, 2 MG/1.5ML SOPN    INJECT 0.5 MG INTO THE SKIN ONCE A WEEK.  Modified Medications   Modified Medication Previous Medication   ACCU-CHEK FASTCLIX LANCETS MISC Accu-Chek FastClix Lancets MISC      Use to check fasting blood sugar and 2 hrs after largest meal    USE AS DIRECTED ONCE A DAY. DX CODE: E11.8   GLUCOSE BLOOD (ACCU-CHEK GUIDE) TEST STRIP ACCU-CHEK GUIDE test strip      Use to check fasting blood sugar and 2 hrs after largest meal    USE AS DIRECTED ONCE A DAY. DX CODE: E11.8   VALSARTAN-HYDROCHLOROTHIAZIDE (DIOVAN-HCT) 320-12.5 MG TABLET valsartan-hydrochlorothiazide (DIOVAN-HCT) 320-12.5 MG tablet      TAKE 1 TABLET BY MOUTH DAILY    TAKE 1 TABLET BY MOUTH DAILY.**PATIENT NEEDS APT FOR FURTHER REFILLS**  Discontinued Medications   No medications on file    Ampicillin  Review of Systems: General:   Denies fever, chills, unexplained weight loss.  Optho/Auditory:   Denies visual changes, blurred vision/LOV Respiratory:   Denies SOB, DOE more than baseline levels.   Cardiovascular:   Denies chest pain, palpitations, new onset peripheral edema  Gastrointestinal:   Denies nausea, vomiting, diarrhea.  Genitourinary: Denies dysuria, freq/ urgency, flank pain or  discharge from genitals.  Endocrine:     Denies hot or cold intolerance, polyuria, polydipsia. Musculoskeletal:   Denies unexplained myalgias, joint swelling, unexplained arthralgias, gait problems.  Skin:  Denies rash, suspicious lesions Neurological:     Denies dizziness, unexplained weakness, numbness  Psychiatric/Behavioral:   Denies mood changes, suicidal or homicidal ideations, hallucinations    Objective:     Blood pressure (!) 156/90, pulse 87, temperature 97.8 F (36.6 C), temperature source Oral, height 5\' 10"  (1.778 m), weight 231 lb 11.2 oz (105.1 kg), SpO2 97 %. Body mass index is 33.25 kg/m. General Appearance:    Alert, cooperative, no distress, appears stated age  Head:    Normocephalic, without obvious abnormality, atraumatic  Eyes:    PERRL, conjunctiva/corneas clear, EOM's intact, fundi    benign, both eyes  Ears:    Normal TM's and external ear canals, both ears  Nose:   Nares normal, septum midline, mucosa normal, no drainage    or sinus tenderness  Throat:   Lips w/o lesion, mucosa moist, and tongue normal; teeth and   gums normal  Neck:   Supple, symmetrical, trachea midline, no adenopathy;    thyroid:  no enlargement/tenderness/nodules; no carotid   bruit or JVD  Back:     Symmetric, no curvature, ROM normal, no CVA tenderness  Lungs:     Clear to auscultation bilaterally, respirations unlabored, no       Wh/ R/ R  Chest Wall:  No tenderness or gross deformity; normal excursion   Heart:    Irregularly irregular, S1 and S2 normal, no murmur, rub   or gallop  Abdomen:     Ventral hernia that is non-tender and easily reducible. Soft, non-tender, bowel sounds active all four quadrants, NO G/R/R, no masses, no organomegaly  Genitalia:    deferred   Rectal:    Deferred.  Patient will obtain colonoscopy this year.  Extremities:   Multiple calluses and corns on foot.  Extremities normal, atraumatic, no cyanosis or gross edema  Pulses:   2+ and symmetric all  extremities  Skin:   Chronic venostasis dermatitis in bilateral lower extremities.  Warm, dry, Skin color, texture, turgor normal, no obvious rashes or lesions  M-Sk:   Ambulates * 4 w/o difficulty, no gross deformities, tone WNL  Neurologic:   CNII-XII intact, normal strength, sensation and reflexes    Throughout Psych:  No HI/SI, judgement and insight good, Euthymic mood. Full Affect.

## 2019-09-26 LAB — MICROALBUMIN / CREATININE URINE RATIO
Creatinine, Urine: 120 mg/dL
Microalb/Creat Ratio: 69 mg/g creat — ABNORMAL HIGH (ref 0–29)
Microalbumin, Urine: 83.3 ug/mL

## 2019-09-26 LAB — CBC
Hematocrit: 46.9 % (ref 37.5–51.0)
Hemoglobin: 16.6 g/dL (ref 13.0–17.7)
MCH: 31.9 pg (ref 26.6–33.0)
MCHC: 35.4 g/dL (ref 31.5–35.7)
MCV: 90 fL (ref 79–97)
Platelets: 214 10*3/uL (ref 150–450)
RBC: 5.2 x10E6/uL (ref 4.14–5.80)
RDW: 13.4 % (ref 11.6–15.4)
WBC: 7.6 10*3/uL (ref 3.4–10.8)

## 2019-09-26 LAB — COMPREHENSIVE METABOLIC PANEL
ALT: 24 IU/L (ref 0–44)
AST: 25 IU/L (ref 0–40)
Albumin/Globulin Ratio: 1.8 (ref 1.2–2.2)
Albumin: 4.4 g/dL (ref 3.8–4.8)
Alkaline Phosphatase: 73 IU/L (ref 39–117)
BUN/Creatinine Ratio: 23 (ref 10–24)
BUN: 24 mg/dL (ref 8–27)
Bilirubin Total: 0.8 mg/dL (ref 0.0–1.2)
CO2: 26 mmol/L (ref 20–29)
Calcium: 10.3 mg/dL — ABNORMAL HIGH (ref 8.6–10.2)
Chloride: 101 mmol/L (ref 96–106)
Creatinine, Ser: 1.05 mg/dL (ref 0.76–1.27)
GFR calc Af Amer: 84 mL/min/{1.73_m2} (ref >59)
GFR calc non Af Amer: 73 mL/min/{1.73_m2} (ref >59)
Globulin, Total: 2.4 g/dL (ref 1.5–4.5)
Glucose: 160 mg/dL — ABNORMAL HIGH (ref 65–99)
Potassium: 4.2 mmol/L (ref 3.5–5.2)
Sodium: 140 mmol/L (ref 134–144)
Total Protein: 6.8 g/dL (ref 6.0–8.5)

## 2019-09-26 LAB — HEMOGLOBIN A1C
Est. average glucose Bld gHb Est-mCnc: 151 mg/dL
Hgb A1c MFr Bld: 6.9 % — ABNORMAL HIGH (ref 4.8–5.6)

## 2019-09-26 LAB — LIPID PANEL
Chol/HDL Ratio: 3.1 ratio (ref 0.0–5.0)
Cholesterol, Total: 118 mg/dL (ref 100–199)
HDL: 38 mg/dL — ABNORMAL LOW (ref >39)
LDL Chol Calc (NIH): 65 mg/dL (ref 0–99)
Triglycerides: 74 mg/dL (ref 0–149)
VLDL Cholesterol Cal: 15 mg/dL (ref 5–40)

## 2019-09-26 LAB — TSH: TSH: 1.83 u[IU]/mL (ref 0.450–4.500)

## 2019-09-26 LAB — T4, FREE: Free T4: 1.3 ng/dL (ref 0.82–1.77)

## 2019-09-26 LAB — VITAMIN D 25 HYDROXY (VIT D DEFICIENCY, FRACTURES): Vit D, 25-Hydroxy: 25.3 ng/mL — ABNORMAL LOW (ref 30.0–100.0)

## 2019-10-02 ENCOUNTER — Other Ambulatory Visit: Payer: Self-pay

## 2019-10-02 MED ORDER — VITAMIN D3 125 MCG (5000 UT) PO CAPS: 1.0000 | ORAL_CAPSULE | Freq: Every day | ORAL | 0 refills | Status: DC

## 2019-10-14 ENCOUNTER — Telehealth: Payer: Self-pay | Admitting: Internal Medicine

## 2019-10-14 NOTE — Telephone Encounter (Signed)
Patient called stating since the end of March, his sugar levels have been up and down and his A1C has gone up as well. He requests a nurse to give him a call at [phone].

## 2019-10-17 ENCOUNTER — Other Ambulatory Visit: Payer: Self-pay | Admitting: Family Medicine

## 2019-10-17 DIAGNOSIS — I152 Hypertension secondary to endocrine disorders: Secondary | ICD-10-CM

## 2019-10-17 DIAGNOSIS — E1159 Type 2 diabetes mellitus with other circulatory complications: Secondary | ICD-10-CM

## 2019-10-21 ENCOUNTER — Telehealth: Payer: Self-pay | Admitting: Physician Assistant

## 2019-10-21 NOTE — Telephone Encounter (Signed)
Pt called requested refill on Valsartin -HCTZ-- saw that on 4/28 Dr. Raliegh Scarlet authorized a 90dy supply but pt was only gvn 30dys per [facility] ,so she will fill Rx according to prescription & contact pt when it is ready. --(Cld    [id number] [address], [place]. [phone] (Phone) [phone] (Fax)   ( spk w/ [place] who reviewed pt Rx & noticed error)  --Issued resolved.--just an fyi for documentation purposes  --glh

## 2019-11-17 ENCOUNTER — Other Ambulatory Visit: Payer: Self-pay | Admitting: Internal Medicine

## 2019-11-25 ENCOUNTER — Other Ambulatory Visit: Payer: Self-pay | Admitting: Internal Medicine

## 2019-12-16 ENCOUNTER — Other Ambulatory Visit: Payer: Self-pay | Admitting: Internal Medicine

## 2019-12-16 NOTE — Telephone Encounter (Signed)
Pt last saw Dr [name] 11/13/18 telemedicine Covid-19, pt is overdue for follow-up.  Pt has follow-up scheduled with Dr [name] on 12/25/19.  Last labs 09/25/19 Creat 1.05, age 69, weight 105.1kg, based on specified criteria pt is on appropriate dosage of Eliquis 5mg  BID.  Will refill rx to get pt to upcoming appt with Dr [name].

## 2019-12-20 ENCOUNTER — Encounter: Payer: Self-pay | Admitting: Internal Medicine

## 2019-12-20 ENCOUNTER — Telehealth: Payer: Self-pay | Admitting: Internal Medicine

## 2019-12-20 ENCOUNTER — Other Ambulatory Visit: Payer: Self-pay

## 2019-12-20 ENCOUNTER — Telehealth (INDEPENDENT_AMBULATORY_CARE_PROVIDER_SITE_OTHER): Payer: BC Managed Care – PPO | Admitting: Internal Medicine

## 2019-12-20 DIAGNOSIS — E669 Obesity, unspecified: Secondary | ICD-10-CM

## 2019-12-20 DIAGNOSIS — E66811 Obesity, class 1: Secondary | ICD-10-CM

## 2019-12-20 DIAGNOSIS — E785 Hyperlipidemia, unspecified: Secondary | ICD-10-CM | POA: Diagnosis not present

## 2019-12-20 DIAGNOSIS — E1122 Type 2 diabetes mellitus with diabetic chronic kidney disease: Secondary | ICD-10-CM | POA: Diagnosis not present

## 2019-12-20 MED ORDER — OZEMPIC (1 MG/DOSE) 4 MG/3ML ~~LOC~~ SOPN
1.0000 mg | PEN_INJECTOR | SUBCUTANEOUS | 3 refills | Status: DC
Start: 2019-12-20 — End: 2020-01-21

## 2019-12-20 NOTE — Progress Notes (Signed)
Patient ID: [name], male   DOB: [birth year], 69 y.o.   MRN: [id number]   Patient location: Home My location: Office Persons participating in the virtual visit: patient, provider  Referring Provider: Lorrene Reid, PA-C  I connected with the patient on 12/20/19 at  3:44 PM EDT by telephone and verified that I am speaking with the correct person.   I discussed the limitations of evaluation and management by telephone and the availability of in person appointments. The patient expressed understanding and agreed to proceed.   Details of the encounter are shown below.  HPI: Tom Fuller is a 69 y.o.-year-old male, presenting for follow-up for DM2, dx in 2017, non-insulin-dependent, uncontrolled, with complications (mild CKD, ED).  Last visit 4 months ago (telephone visit).  2 days ago he started to have diplopia >> resolved. He d/w cardiology and will have an appt soon. BP was high this am: 145/84.  Reviewed HbA1c levels: Lab Results  Component Value Date   HGBA1C 6.9 (H) 09/25/2019   HGBA1C 5.9 (A) 08/16/2019   HGBA1C 8.0 (A) 02/08/2019   HGBA1C 8.5 (A) 07/06/2018   HGBA1C 12.1 (H) 01/12/2018   HGBA1C 10.7 (H) 09/19/2017   HGBA1C 8.1 (H) 03/21/2016   HGBA1C 7.9 (H) 10/29/2015   Pt is on a regimen of: - Metformin ER 1000 mg with dinner -started 06/2018 >> 500 mg 2x a day with meals >> 1000 mg with dinner - Farxiga 10 mg before b'fast - Glipizide 5 mg 2x a day before meals -started 01/2019 - Ozempic 0.5 mg weekly in am  - started 02/2018 >> mild constipation PCP tried to start Ozempic(PA approved) but he decided to hold off starting until he is seen here) He stopped regular metformin due to abdominal pain  Pt checks his sugars once a day: - am: 96, 104-125, 130 >> 93, 97-136, 141, 206 >> 144, 148-164, 177 - 2h after b'fast: n/c - before lunch: n/c  - 2h after lunch: n/c >> 141 >> n/c - before dinner: n/c >> 142 - 2h after dinner: 97 >> 70 >> n/c - bedtime: n/c >> 140 >>  n/c - nighttime: n/c Lowest sugar was 220 >> 147 >> 134 >> 98 >> 70 >> 144; he has hypoglycemia awareness in the 70s. Highest sugar was 346 >> 230 >> 197 >> 130 >> 206 >> 177.  Glucometer: Accu-Chek  Pt's meals are: - Breakfast: cereal (rice crispies) with milk, boiled eggs - Lunch: banana sandwich, whole wheat - Dinner: varies - Snacks: 2-3x He was drinking orange juice and milk in the past and I advised him to stop >> now just a little.  -+ Mild CKD, last BUN/creatinine:  Lab Results  Component Value Date   BUN 24 09/25/2019   BUN 24 (H) 09/20/2019   CREATININE 1.05 09/25/2019   CREATININE 1.17 09/20/2019  On valsartan 320.  -+ HL; last set of lipids: Lab Results  Component Value Date   CHOL 118 09/25/2019   HDL 38 (L) 09/25/2019   LDLCALC 65 09/25/2019   TRIG 74 09/25/2019   CHOLHDL 3.1 09/25/2019   - last eye exam was in 2021: No DR reportedly, + cataract.   - no numbness and tingling in his feet.  Pt has FH of DM in PGF.  ROS: Constitutional: no weight gain/no weight loss, no fatigue, no subjective hyperthermia, no subjective hypothermia Eyes: no blurry vision, no xerophthalmia, + diplopia (resolved) ENT: no sore throat, no nodules palpated in neck, no dysphagia, no  odynophagia, no hoarseness Cardiovascular: no CP/no SOB/no palpitations/no leg swelling Respiratory: no cough/no SOB/no wheezing Gastrointestinal: no N/no V/no D/no C/no acid reflux Musculoskeletal: no muscle aches/no joint aches Skin: no rashes, no hair loss Neurological: no tremors/no numbness/no tingling/no dizziness  I reviewed pt's medications, allergies, PMH, social hx, family hx, and changes were documented in the history of present illness. Otherwise, unchanged from my initial visit note.  Past Medical History:  Diagnosis Date  . Atrial fibrillation ([place])   . Cervical lymphadenopathy   . Erectile dysfunction   . GERD (gastroesophageal reflux disease)   . Hypertension   . Lymphoma,  follicular ([place])    Dr.Shadad   Past Surgical History:  Procedure Laterality Date  . a bsc    . ABSCESS DRAINAGE     under rt eye   . DEEP NECK LYMPH NODE BIOPSY / EXCISION  2011  . VASECTOMY     Social History   Socioeconomic History  . Marital status: Married    Spouse name: Not on file  . Number of children: 2  Occupational History  .  Saw operator  Tobacco Use  . Smoking status: Never Smoker  . Smokeless tobacco: Never Used  Substance and Sexual Activity  . Alcohol use: No  . Drug use: No  . Sexual activity: Yes    Partners: Female   Current Outpatient Medications on File Prior to Visit  Medication Sig Dispense Refill  . Accu-Chek FastClix Lancets MISC Use to check fasting blood sugar and 2 hrs after largest meal 102 each 1  . apixaban (ELIQUIS) 5 MG TABS tablet Take 1 tablet (5 mg total) by mouth 2 (two) times daily. Overdue for follow-up, Must keep scheduled appt with MD for future refills. 180 tablet 0  . Cholecalciferol (VITAMIN D3) 125 MCG (5000 UT) CAPS Take 1 capsule (5,000 Units total) by mouth daily. 90 capsule 0  . dapagliflozin propanediol (FARXIGA) 10 MG TABS tablet Take 10 mg by mouth daily. 90 tablet 1  . glucose blood (ACCU-CHEK GUIDE) test strip Use to check fasting blood sugar and 2 hrs after largest meal 100 strip 1  . metFORMIN (GLUCOPHAGE-XR) 500 MG 24 hr tablet Take 1 tablet (500 mg total) by mouth 2 (two) times daily after a meal. Hold until pt. needs it 180 tablet 3  . metoprolol tartrate (LOPRESSOR) 25 MG tablet TAKE 1 TABLET BY MOUTH TWICE A DAY 180 tablet 3  . OZEMPIC, 0.25 OR 0.5 MG/DOSE, 2 MG/1.5ML SOPN INJECT 0.5 MG INTO THE SKIN ONCE A WEEK. 4.5 pen 1  . valsartan-hydrochlorothiazide (DIOVAN-HCT) 320-12.5 MG tablet TAKE 1 TABLET BY MOUTH DAILY 90 tablet 0   No current facility-administered medications on file prior to visit.   Allergies  Allergen Reactions  . Ampicillin Rash   Family History  Problem Relation Age of Onset  .  Hypertension Mother   . Kidney disease Mother   . Hypertension Father   . Hypertension Brother   . Diabetes Paternal Grandfather   . Hypertension Paternal Grandfather   . Kidney disease Maternal Aunt   . Kidney disease Maternal Grandmother    PE: There were no vitals taken for this visit. Wt Readings from Last 3 Encounters:  09/25/19 231 lb 11.2 oz (105.1 kg)  09/20/19 231 lb 4.8 oz (104.9 kg)  08/16/19 232 lb (105.2 kg)   Constitutional:  in NAD  The physical exam was not performed (telephone visit).  ASSESSMENT: 1. DM2, non-insulin-dependent, now controlled, with long-term complications - mild CKD -  ED  2. HL  3.  Obesity class I  PLAN:  1. Patient with longstanding, uncontrolled, type 2 diabetes, on oral antidiabetic regimen with Metformin and SGLT2 inhibitor, and also weekly GLP-1 receptor agonist, with improved control at last visit, after which we could stop the sulfonylurea.  At that time, HbA1c was 5.9% and I suspected that he had low blood sugars.  He only got one low blood sugar at 70 after dinner but he was not checking sugars consistently and may have missed other hypoglycemia episodes.  At that time, sugars were at goal in the morning, with only few hyperglycemic exceptions due to dietary indiscretions.  He had another HbA1c checked 1 month later and this was higher, at 6.9%.  He contacted me 09/2019 with higher blood sugars in the morning and I advised him to move the entire metformin dose with dinner. -At this visit, sugars are still high in the morning despite moving the entire metformin dose with dinner.  He is unfortunately not checking later in the day and I strongly advised him to check some sugars before and after dinner.  For now, I advised him to increase Ozempic to 1 mg weekly.  He is tolerating the lower dose well.  He will let me know when he needs a refill for the 1 mg dose, since now he has plenty of 0.5 mg doses at home and will use these. - I suggested  to:  Patient Instructions  Please continue: - Metformin ER 1000 mg with dinner - Farxiga 10 mg before breakfast  Please increase: - Ozempic 1 mg weekly in am   Please return in 4 months with your sugar log.   - we will check his HbA1c when he returns to the clinic - advised to check sugars at different times of the day - 1x a day, rotating check times - advised for yearly eye exams >> he is UTD.  He had a recent episode of diplopia and I did advise him to contact his ophthalmologist to see if he needs to be seen. - return to clinic in 4 months  2. HL -Reviewed latest lipid panel from 08/2019: At goal with exception of a slightly low HDL: Lab Results  Component Value Date   CHOL 118 09/25/2019   HDL 38 (L) 09/25/2019   LDLCALC 65 09/25/2019   TRIG 74 09/25/2019   CHOLHDL 3.1 09/25/2019  -He is not on a statin  3.  Obesity class I -continue SGLT 2 inhibitor and GLP-1 receptor agonist which should also help with weight loss.  At this visit, we will also increase the dose of his GLP-1 receptor agonist. -He gained 8 pounds before last visit, now weight stable reportedly  - Total time spent for the visit: 10:30 min, in obtaining medical information from the chart, reviewing his  previous labs, CBGs, and treatments, reviewing his symptoms, counseling him about his conditions (please see the discussed topics above), and developing a plan to further investigate and treat it; he had a number of questions which I addressed.   Tom Kingdom, MD PhD [facility]

## 2019-12-20 NOTE — Telephone Encounter (Signed)
Pt reports some "double like vision", "it looks like things are stacked, like I look at a digital clock there are two". He also reports a "little nagging chest discomfort" but not bad at all.  "A small spot In center of chest".  He does not notice it when he is working/moving, but cal feel it if he stops doing something. Possible muscle strain? Reports normal BP, slightly elevated this morning at 145/84. Denies CP, SOB, dizziness/light headedness, weight gain/swelling. Advised pt to f/u w/ PCP today about double vision. Advised to monitor chest discomfort, if worsens/changes in nature to go to ED. Pt already scheduled for yearly routine follow up on Wednesday w/ Dr. [name]. Pt agreeable to plan.

## 2019-12-20 NOTE — Patient Instructions (Addendum)
Please continue: - Metformin ER 1000 mg with dinner - Farxiga 10 mg before breakfast  Please increase: - Ozempic 1 mg weekly in am   Please return in 4 months with your sugar log.

## 2019-12-20 NOTE — Telephone Encounter (Signed)
Since Wednesday, Tom Fuller has been seeing things stacked on top of each other. Tom Fuller states this is like double vision. He said that if he takes his glasses and twist them a little bit, it'll fix the issue. Tom Fuller said he has also been having a little bit of a pain in the center of his breast bone.   Pt c/o of Chest Pain: STAT if CP now or developed within 24 hours  1. Are you having CP right now? Very little pain, he can feel it but it isn't like someone is squeezing him. He is at work right now, and said it isn't causing issues to where he can't work.   2. Are you experiencing any other symptoms (ex. SOB, nausea, vomiting, sweating)?  No  3. How long have you been experiencing CP? He isn't sure  4. Is your CP continuous or coming and going? Comes and goes  5. Have you taken Nitroglycerin? No ?

## 2019-12-25 ENCOUNTER — Other Ambulatory Visit: Payer: Self-pay

## 2019-12-25 ENCOUNTER — Ambulatory Visit: Payer: BC Managed Care – PPO | Admitting: Internal Medicine

## 2019-12-25 ENCOUNTER — Encounter: Payer: Self-pay | Admitting: Internal Medicine

## 2019-12-25 VITALS — BP 104/70 | HR 88 | Ht 70.0 in | Wt 223.0 lb

## 2019-12-25 DIAGNOSIS — I5032 Chronic diastolic (congestive) heart failure: Secondary | ICD-10-CM

## 2019-12-25 DIAGNOSIS — I480 Paroxysmal atrial fibrillation: Secondary | ICD-10-CM | POA: Diagnosis not present

## 2019-12-25 DIAGNOSIS — I10 Essential (primary) hypertension: Secondary | ICD-10-CM

## 2019-12-25 NOTE — Progress Notes (Signed)
HPI [name] returns today for followup. He is a pleasant 69 yo man with a h/o HTN, PAF, lymphoma, who has done well. In the interim he denies chest pain or sob. He does not have palpitations. He has not had a covid vaccine.  Allergies  Allergen Reactions   Ampicillin Rash     Current Outpatient Medications  Medication Sig Dispense Refill   Accu-Chek FastClix Lancets MISC Use to check fasting blood sugar and 2 hrs after largest meal 102 each 1   apixaban (ELIQUIS) 5 MG TABS tablet Take 1 tablet (5 mg total) by mouth 2 (two) times daily. Overdue for follow-up, Must keep scheduled appt with MD for future refills. 180 tablet 0   Cholecalciferol (VITAMIN D3) 125 MCG (5000 UT) CAPS Take 1 capsule (5,000 Units total) by mouth daily. 90 capsule 0   dapagliflozin propanediol (FARXIGA) 10 MG TABS tablet Take 10 mg by mouth daily. 90 tablet 1   glucose blood (ACCU-CHEK GUIDE) test strip Use to check fasting blood sugar and 2 hrs after largest meal 100 strip 1   metFORMIN (GLUCOPHAGE-XR) 500 MG 24 hr tablet Take 1 tablet (500 mg total) by mouth 2 (two) times daily after a meal. Hold until pt. needs it 180 tablet 3   metoprolol tartrate (LOPRESSOR) 25 MG tablet TAKE 1 TABLET BY MOUTH TWICE A DAY 180 tablet 3   Semaglutide, 1 MG/DOSE, (OZEMPIC, 1 MG/DOSE,) 4 MG/3ML SOPN Inject 0.75 mLs (1 mg total) into the skin once a week. 9 mL 3   valsartan-hydrochlorothiazide (DIOVAN-HCT) 320-12.5 MG tablet TAKE 1 TABLET BY MOUTH DAILY 90 tablet 0   No current facility-administered medications for this visit.     Past Medical History:  Diagnosis Date   Atrial fibrillation ([facility])    Cervical lymphadenopathy    Erectile dysfunction    GERD (gastroesophageal reflux disease)    Hypertension    Lymphoma, follicular ([facility])    Dr.Shadad    ROS:   All systems reviewed and negative except as noted in the HPI.   Past Surgical History:  Procedure Laterality Date   a bsc     ABSCESS  DRAINAGE     under rt eye    DEEP NECK LYMPH NODE BIOPSY / EXCISION  2011   VASECTOMY       Family History  Problem Relation Age of Onset   Hypertension Mother    Kidney disease Mother    Hypertension Father    Hypertension Brother    Diabetes Paternal Grandfather    Hypertension Paternal Grandfather    Kidney disease Maternal Aunt    Kidney disease Maternal Grandmother      Social History   Socioeconomic History   Marital status: Married    Spouse name: Not on file   Number of children: Not on file   Years of education: Not on file   Highest education level: Not on file  Occupational History   Not on file  Tobacco Use   Smoking status: Never Smoker   Smokeless tobacco: Never Used  Substance and Sexual Activity   Alcohol use: No   Drug use: No   Sexual activity: Yes    Partners: Female  Other Topics Concern   Not on file  Social History Narrative   Not on file   Social Determinants of Health   Financial Resource Strain:    Difficulty of Paying Living Expenses:   Food Insecurity:    Worried About Running Out  of Food in the Last Year:    Arboriculturist in the Last Year:   Transportation Needs:    Film/video editor (Medical):    Lack of Transportation (Non-Medical):   Physical Activity:    Days of Exercise per Week:    Minutes of Exercise per Session:   Stress:    Feeling of Stress :   Social Connections:    Frequency of Communication with Friends and Family:    Frequency of Social Gatherings with Friends and Family:    Attends Religious Services:    Active Member of Clubs or Organizations:    Attends Music therapist:    Marital Status:   Intimate Partner Violence:    Fear of Current or Ex-Partner:    Emotionally Abused:    Physically Abused:    Sexually Abused:      BP 104/70    Pulse 88    Ht 5\' 10"  (1.778 m)    Wt (!) 223 lb (101.2 kg)    SpO2 97%    BMI 32.00 kg/m   Physical  Exam:  Well appearing 69 yo man, NAD HEENT: Unremarkable Neck:  No JVD, no thyromegally Lymphatics:  No adenopathy Back:  No CVA tenderness Lungs:  Clear HEART:  IRegular rate rhythm, no murmurs, no rubs, no clicks Abd:  soft, positive bowel sounds, no organomegally, no rebound, no guarding Ext:  2 plus pulses, no edema, no cyanosis, no clubbing Skin:  No rashes no nodules Neuro:  CN II through XII intact, motor grossly intact  EKG - atrial fib with a controlled VR    Assess/Plan: 1.  Persistent atrial fib - he is asymptomatic and his VR is well controlled.  2. HTN - his pressures have been low/normal. He will continue his current meds. 3. Chronic diastolic heart failure - I encouraged the patient to avoid salty foods. He will maintain a low sodium diet. 4. COVID 19 screen - he is still not vaccinated. I have strongly encouraged him to get vaccinated.   Carleene Overlie Isa Kohlenberg,MD

## 2019-12-25 NOTE — Patient Instructions (Signed)
Medication Instructions:  Your physician recommends that you continue on your current medications as directed. Please refer to the Current Medication list given to you today.  *If you need a refill on your cardiac medications before your next appointment, please call your pharmacy*  Lab Work: None ordered.  If you have labs (blood work) drawn today and your tests are completely normal, you will receive your results only by: Marland Kitchen MyChart Message (if you have MyChart) OR . A paper copy in the mail If you have any lab test that is abnormal or we need to change your treatment, we will call you to review the results.  Testing/Procedures: None ordered.  Follow-Up: At [facility], you and your health needs are our priority.  As part of our continuing mission to provide you with exceptional heart care, we have created designated Provider Care Teams.  These Care Teams include your primary Cardiologist (physician) and Advanced Practice Providers (APPs -  Physician Assistants and Nurse Practitioners) who all work together to provide you with the care you need, when you need it.  We recommend signing up for the patient portal called "MyChart".  Sign up information is provided on this After Visit Summary.  MyChart is used to connect with patients for Virtual Visits (Telemedicine).  Patients are able to view lab/test results, encounter notes, upcoming appointments, etc.  Non-urgent messages can be sent to your provider as well.   To learn more about what you can do with MyChart, go to [email or web address].    Your next appointment:   Your physician wants you to follow-up in: 1 year with Dr. Lovena Fuller. You will receive a reminder letter in the mail two months in advance. If you don't receive a letter, please call our office to schedule the follow-up appointment.  Other Instructions:

## 2020-01-02 ENCOUNTER — Other Ambulatory Visit: Payer: Self-pay

## 2020-01-02 ENCOUNTER — Ambulatory Visit (INDEPENDENT_AMBULATORY_CARE_PROVIDER_SITE_OTHER): Payer: BC Managed Care – PPO | Admitting: Physician Assistant

## 2020-01-02 ENCOUNTER — Encounter: Payer: Self-pay | Admitting: Physician Assistant

## 2020-01-02 VITALS — Temp 99.4°F | Ht 70.0 in | Wt 215.0 lb

## 2020-01-02 DIAGNOSIS — IMO0002 Reserved for concepts with insufficient information to code with codable children: Secondary | ICD-10-CM

## 2020-01-02 DIAGNOSIS — E1169 Type 2 diabetes mellitus with other specified complication: Secondary | ICD-10-CM | POA: Diagnosis not present

## 2020-01-02 DIAGNOSIS — E1151 Type 2 diabetes mellitus with diabetic peripheral angiopathy without gangrene: Secondary | ICD-10-CM

## 2020-01-02 DIAGNOSIS — E785 Hyperlipidemia, unspecified: Secondary | ICD-10-CM

## 2020-01-02 DIAGNOSIS — I152 Hypertension secondary to endocrine disorders: Secondary | ICD-10-CM

## 2020-01-02 DIAGNOSIS — U071 COVID-19: Secondary | ICD-10-CM | POA: Diagnosis not present

## 2020-01-02 DIAGNOSIS — E1165 Type 2 diabetes mellitus with hyperglycemia: Secondary | ICD-10-CM

## 2020-01-02 DIAGNOSIS — I10 Essential (primary) hypertension: Secondary | ICD-10-CM

## 2020-01-02 DIAGNOSIS — E1159 Type 2 diabetes mellitus with other circulatory complications: Secondary | ICD-10-CM

## 2020-01-02 NOTE — Progress Notes (Signed)
Telehealth office visit note for Lorrene Reid, PA-C- at [facility] at [facility]   I connected with current patient today by telephone and verified that I am speaking with the correct person   . Location of the patient: Home . Location of the provider: Office - This visit type was conducted due to national recommendations for restrictions regarding the COVID-19 Pandemic (e.g. social distancing) in an effort to limit this patient's exposure and mitigate transmission in our community.    - No physical exam could be performed with this format, beyond that communicated to [place] by the patient/ family members as noted.   - Additionally my office staff/ schedulers were to discuss with the patient that there may be a monetary charge related to this service, depending on their medical insurance.  My understanding is that patient understood and consented to proceed.     _________________________________________________________________________________   History of Present Illness: Pt calls in to follow-up on hypertension and diabetes. Pt was recently diagnose with Covid-19 and states only has a sore throat and a mild, intermittent productive cough. Denies shortness of breath or fever.   Diabetes: Pt is followed by endocrinology. Denies increased urination or thirst. Pt reports medication compliance. No hypoglycemic events. Checking glucose at home. FBS average 120s-150.  HTN: Pt denies chest pain, palpitations, dizziness or lower extremity swelling. Taking medication as directed without side effects. Checks BP at home and readings range in 110s-120s/70-86. Pt tries to monitor his sodium.     No flowsheet data found.  Depression screen [facility] 2/9 01/02/2020 09/25/2019 08/12/2019 10/03/2017 06/21/2016  Decreased Interest 0 0 0 0 0  Down, Depressed, Hopeless 0 0 1 1 0  PHQ - 2 Score 0 0 1 1 0  Altered sleeping 1 0 0 - -  Tired, decreased energy 0 1 1 - -  Change in appetite 0 0 0 - -  Feeling bad  or failure about yourself  0 0 1 - -  Trouble concentrating 0 0 0 - -  Moving slowly or fidgety/restless 0 0 0 - -  Suicidal thoughts 0 0 0 - -  PHQ-9 Score 1 1 3  - -  Difficult doing work/chores Not difficult at all Not difficult at all Not difficult at all - -  Some recent data might be hidden      Impression and Recommendations:     1. DM (diabetes mellitus) type II uncontrolled, periph vascular disorder ([place])   2. Hypertension associated with diabetes ([place])   3. Hyperlipidemia associated with type 2 diabetes mellitus ([place])   4. COVID-19 virus infection      Diabetes Mellitus type 2: -Last A1c stable -Continue to follow-up with endocrinology. -Continue current medication regimen. -Continue ambulatory glucose monitoring. -Follow a low carbohydrate and glucose diet  Hypertension associated with diabetes: -Stable -Continue current medication regimen. -Continue ambulatory BP and pulse monitoring. -Follow a low-sodium diet and stay active.  Hyperlipidemia associated with type 2 diabetes mellitus: -Last lipid panel: Total cholesterol 118, triglycerides 74, HDL 38 and LDL 65 -Follow a heart healthy diet. -Plan to recheck lipid panel next OV.  COVID-19 virus infection: -Reports mild symptoms and advised to monitor for worsening symptoms and/or shortness of breath. -Discussed quarantine and safety guidelines.  For additional information research CDC or local health department.    - As part of my medical decision making, I reviewed the following data within the [place] History obtained from pt /family, CMA notes reviewed  and incorporated if applicable, Labs reviewed, Radiograph/ tests reviewed if applicable and OV notes from prior OV's with me, as well as any other specialists she/he has seen since seeing me last, were all reviewed and used in my medical decision making process today.    - Additionally, when appropriate, discussion had with patient regarding  our treatment plan, and their biases/concerns about that plan were used in my medical decision making today.    - The patient agreed with the plan and demonstrated an understanding of the instructions.   No barriers to understanding were identified.     - The patient was advised to call back or seek an in-person evaluation if the symptoms worsen or if the condition fails to improve as anticipated.   Return in about 3 months (around 04/03/2020) for HTN, DM and FBW (lipid panel, cmp, A1c).    No orders of the defined types were placed in this encounter.   No orders of the defined types were placed in this encounter.   There are no discontinued medications.     Time spent on visit including pre-visit chart review and post-visit care was  15 minutes.    Note:  This note was prepared with assistance of Dragon voice recognition software. Occasional wrong-word or sound-a-like substitutions may have occurred due to the inherent limitations of voice recognition software.   The [place] was signed into law in 2016 which includes the topic of electronic health records.  This provides immediate access to information in MyChart.  This includes consultation notes, operative notes, office notes, lab results and pathology reports.  If you have any questions about what you read please let us know at your next visit or call us at the office.  We are right here with you.   __________________________________________________________________________________     Patient Care Team    Relationship Specialty Notifications Start End  Lorrene Reid, [place] PCP - General   09/29/19   Evans Lance, MD Consulting Physician Cardiology  08/12/19   Philemon Kingdom, MD Consulting Physician Endocrinology  08/12/19   Wyatt Portela, MD Consulting Physician Oncology  08/12/19      -Vitals obtained; medications/ allergies reconciled;  personal medical, social, Sx etc.histories were updated by CMA,  reviewed by me and are reflected in chart   Patient Active Problem List   Diagnosis Date Noted  . DM (diabetes mellitus) type II uncontrolled, periph vascular disorder ([place]) 09/25/2019  . Obesity, Class I, BMI 30.0-34.9 (see actual BMI) 07/06/2018  . Type 2 diabetes mellitus with chronic kidney disease, without long-term current use of insulin ([place]) 03/26/2016  . Hyperlipidemia LDL goal <70 03/26/2016  . Left otitis media 06/17/2015  . Preventative health care 09/23/2010  . ATRIAL FIBRILLATION 09/09/2008  . Lymphoma ([place]) 08/07/2008  . Enlarged lymph nodes 07/07/2008  . GERD 07/25/2007  . ERECTILE DYSFUNCTION 03/15/2007  . Essential hypertension 02/27/2007     Current Meds  Medication Sig  . Accu-Chek FastClix Lancets MISC Use to check fasting blood sugar and 2 hrs after largest meal  . apixaban (ELIQUIS) 5 MG TABS tablet Take 1 tablet (5 mg total) by mouth 2 (two) times daily. Overdue for follow-up, Must keep scheduled appt with MD for future refills.  . Cholecalciferol (VITAMIN D3) 125 MCG (5000 UT) CAPS Take 1 capsule (5,000 Units total) by mouth daily.  . dapagliflozin propanediol (FARXIGA) 10 MG TABS tablet Take 10 mg by mouth daily.  Marland Kitchen glucose blood (ACCU-CHEK GUIDE) test strip  Use to check fasting blood sugar and 2 hrs after largest meal  . metFORMIN (GLUCOPHAGE-XR) 500 MG 24 hr tablet Take 1 tablet (500 mg total) by mouth 2 (two) times daily after a meal. Hold until pt. needs it  . metoprolol tartrate (LOPRESSOR) 25 MG tablet TAKE 1 TABLET BY MOUTH TWICE A DAY  . Semaglutide, 1 MG/DOSE, (OZEMPIC, 1 MG/DOSE,) 4 MG/3ML SOPN Inject 0.75 mLs (1 mg total) into the skin once a week.  . valsartan-hydrochlorothiazide (DIOVAN-HCT) 320-12.5 MG tablet TAKE 1 TABLET BY MOUTH DAILY     Allergies:  Allergies  Allergen Reactions  . Ampicillin Rash     ROS:  See above HPI for pertinent positives and negatives   Objective:   Temperature 99.4 F (37.4 C), temperature source Oral,  height 5\' 10"  (1.778 m), weight 215 lb (97.5 kg).  (if some vitals are omitted, this means that patient was UNABLE to obtain them even though they were asked to get them prior to OV today.  They were asked to call us at their earliest convenience with these once obtained. ) General: A & O * 3; sounds in no acute distress; in usual state of health.  Respiratory: speaking in full sentences, no conversational dyspnea Psych: insight appears good, mood- appears full

## 2020-01-06 ENCOUNTER — Telehealth: Payer: Self-pay | Admitting: Physician Assistant

## 2020-01-06 NOTE — Telephone Encounter (Signed)
Patient called and left VM saying he was diagnosed with COVID a few weeks ago and has some clinic questions for the nurse in regards to quarantine length and after care questions once he is clear of COVID. He can be reached at [phone]

## 2020-01-06 NOTE — Telephone Encounter (Signed)
Advised pt that MyChart message sent with COVID guidelines.  Advised pt that he should review this information and call back if he has any further questions.  Pt expressed understanding and is agreeable.  Charyl Bigger, CMA

## 2020-01-10 ENCOUNTER — Telehealth: Payer: Self-pay | Admitting: Physician Assistant

## 2020-01-10 NOTE — Telephone Encounter (Signed)
Patient states that he tested positive for COVID 11 days ago but is feeling bad still. Patient states he has lost about 15 lbs since contracting covid and that he has been heaving and his legs are very shaky. I advised patient he may need to be evaluated and advised to go to [facility] or ED for evaluation.   Patient states his wife Raeford Brandenburg is also getting sick-nasal congestion, cough and not eating but that she tested negative 11 days ago. I advised patient that she may have COVID and needs to be tested and evaluated if having SOB or difficulty breathing. Patient verbalized understanding and states they are both going to UC for evaluation.   I advised them to wear a mask and to call UC and advise that he is COVID positive. AS, CMA

## 2020-01-10 NOTE — Telephone Encounter (Signed)
Patient called saying he is COVID positive and is day 11 of quarantine. He has some clinical questions and is requesting to speak with nurse about his concerns. Please contact when available

## 2020-01-14 ENCOUNTER — Telehealth: Payer: Self-pay | Admitting: Physician Assistant

## 2020-01-14 NOTE — Telephone Encounter (Signed)
Patient requesting to speak with nurse now that he is outside his 14 day quarantine period and some questions he has, also wants to see about getting a return to work note.

## 2020-01-14 NOTE — Telephone Encounter (Signed)
Patient requested a return to work note. States today Is last day for quarantine from [place].   Please advise. AS, CMA

## 2020-01-14 NOTE — Telephone Encounter (Signed)
Patient states no fever for more than 24hrs. Note printed and placed at front desk for patient to pick up. He is aware. AS< CMA

## 2020-01-14 NOTE — Telephone Encounter (Signed)
Ok to provide work note as long as patient hasn't had any fever for 24 hours without having to take fever-reducing medication and symptoms have improved.  Thank you, Herb Grays

## 2020-01-16 ENCOUNTER — Other Ambulatory Visit: Payer: Self-pay | Admitting: Family Medicine

## 2020-01-16 DIAGNOSIS — E1159 Type 2 diabetes mellitus with other circulatory complications: Secondary | ICD-10-CM

## 2020-01-16 DIAGNOSIS — I152 Hypertension secondary to endocrine disorders: Secondary | ICD-10-CM

## 2020-01-21 ENCOUNTER — Other Ambulatory Visit: Payer: Self-pay

## 2020-01-21 MED ORDER — OZEMPIC (1 MG/DOSE) 4 MG/3ML ~~LOC~~ SOPN: 1.0000 mg | PEN_INJECTOR | SUBCUTANEOUS | 3 refills | Status: DC

## 2020-01-24 ENCOUNTER — Other Ambulatory Visit: Payer: Self-pay

## 2020-01-24 MED ORDER — OZEMPIC (1 MG/DOSE) 4 MG/3ML ~~LOC~~ SOPN: 1.0000 mg | PEN_INJECTOR | SUBCUTANEOUS | 3 refills | Status: DC

## 2020-01-27 ENCOUNTER — Telehealth: Payer: Self-pay | Admitting: Physician Assistant

## 2020-01-27 DIAGNOSIS — I152 Hypertension secondary to endocrine disorders: Secondary | ICD-10-CM

## 2020-01-27 MED ORDER — VALSARTAN-HYDROCHLOROTHIAZIDE 320-12.5 MG PO TABS: ORAL_TABLET | ORAL | 0 refills | Status: DC

## 2020-01-27 NOTE — Telephone Encounter (Signed)
Refill sent to requested pharmacy. AS, CMA 

## 2020-01-27 NOTE — Telephone Encounter (Signed)
Patient calls in stating he needs a refill on valsartan. [facility] [address]

## 2020-01-27 NOTE — Addendum Note (Signed)
Addended by: Mickel Crow on: 01/27/2020 09:07 AM   Modules accepted: Orders

## 2020-01-29 ENCOUNTER — Telehealth: Payer: Self-pay | Admitting: Physician Assistant

## 2020-01-29 DIAGNOSIS — I152 Hypertension secondary to endocrine disorders: Secondary | ICD-10-CM

## 2020-01-29 DIAGNOSIS — E1159 Type 2 diabetes mellitus with other circulatory complications: Secondary | ICD-10-CM

## 2020-01-29 MED ORDER — VALSARTAN-HYDROCHLOROTHIAZIDE 320-12.5 MG PO TABS: ORAL_TABLET | ORAL | 0 refills | Status: DC

## 2020-01-29 NOTE — Telephone Encounter (Signed)
Patient's wife called on behalf of patient (DPR on file) and has some questions about her husband's valsartan. Please contact at [phone]  Patient spouse asking for refill of Valsartan for patient. I advised refill was sent 01/27/20 and we have confirmation from pharmacy they received it. Enid Derry says they have called and been to pharmacy and the pharmacy said they do not have refill from [place].   Sending refill to pharmacy. AS, CMA

## 2020-02-09 ENCOUNTER — Other Ambulatory Visit: Payer: Self-pay | Admitting: Family Medicine

## 2020-03-19 ENCOUNTER — Other Ambulatory Visit: Payer: Self-pay | Admitting: Internal Medicine

## 2020-03-19 DIAGNOSIS — IMO0002 Reserved for concepts with insufficient information to code with codable children: Secondary | ICD-10-CM

## 2020-03-19 DIAGNOSIS — E1165 Type 2 diabetes mellitus with hyperglycemia: Secondary | ICD-10-CM

## 2020-03-20 ENCOUNTER — Other Ambulatory Visit: Payer: Self-pay | Admitting: Internal Medicine

## 2020-03-20 DIAGNOSIS — I480 Paroxysmal atrial fibrillation: Secondary | ICD-10-CM

## 2020-03-20 NOTE — Telephone Encounter (Signed)
Prescription refill request for Eliquis received. Indication: a fib Last office visit: 12/25/19 Scr: 1.05 Age: 69 Weight: 97.5kg

## 2020-04-28 ENCOUNTER — Telehealth: Payer: Self-pay | Admitting: Physician Assistant

## 2020-04-28 MED ORDER — ACCU-CHEK FASTCLIX LANCETS MISC: 3 refills | Status: DC

## 2020-04-28 NOTE — Telephone Encounter (Signed)
Patient needs accu-check lancets sent in. [facility] [address] Thanks

## 2020-04-28 NOTE — Addendum Note (Signed)
Addended by: Fonnie Mu on: 04/28/2020 02:38 PM   Modules accepted: Orders

## 2020-05-07 ENCOUNTER — Telehealth: Payer: Self-pay | Admitting: Physician Assistant

## 2020-05-07 NOTE — Telephone Encounter (Signed)
Patient needs a refill on glucose test strips. [facility] [address] Thanks

## 2020-05-10 ENCOUNTER — Other Ambulatory Visit: Payer: Self-pay | Admitting: Internal Medicine

## 2020-05-11 ENCOUNTER — Telehealth: Payer: Self-pay | Admitting: Physician Assistant

## 2020-05-11 NOTE — Telephone Encounter (Signed)
Patient needs a refill on glucose test strips. [facility] [address] Thanks

## 2020-05-15 ENCOUNTER — Other Ambulatory Visit: Payer: Self-pay | Admitting: Physician Assistant

## 2020-05-15 MED ORDER — ACCU-CHEK GUIDE VI STRP: ORAL_STRIP | 1 refills | Status: DC

## 2020-05-25 ENCOUNTER — Other Ambulatory Visit: Payer: Self-pay | Admitting: Oncology

## 2020-05-25 DIAGNOSIS — C8280 Other types of follicular lymphoma, unspecified site: Secondary | ICD-10-CM

## 2020-05-26 ENCOUNTER — Inpatient Hospital Stay: Payer: BC Managed Care – PPO

## 2020-05-26 ENCOUNTER — Other Ambulatory Visit: Payer: Self-pay

## 2020-05-26 ENCOUNTER — Inpatient Hospital Stay: Payer: BC Managed Care – PPO | Attending: Oncology | Admitting: Oncology

## 2020-05-26 ENCOUNTER — Ambulatory Visit (INDEPENDENT_AMBULATORY_CARE_PROVIDER_SITE_OTHER): Payer: BC Managed Care – PPO | Admitting: Internal Medicine

## 2020-05-26 ENCOUNTER — Encounter: Payer: Self-pay | Admitting: Internal Medicine

## 2020-05-26 VITALS — BP 142/96 | HR 79 | Temp 97.8°F | Resp 17 | Ht 70.0 in | Wt 212.9 lb

## 2020-05-26 VITALS — BP 120/90 | HR 88 | Ht 70.0 in | Wt 214.0 lb

## 2020-05-26 DIAGNOSIS — Z7901 Long term (current) use of anticoagulants: Secondary | ICD-10-CM | POA: Insufficient documentation

## 2020-05-26 DIAGNOSIS — C8280 Other types of follicular lymphoma, unspecified site: Secondary | ICD-10-CM

## 2020-05-26 DIAGNOSIS — Z9225 Personal history of immunosupression therapy: Secondary | ICD-10-CM | POA: Diagnosis not present

## 2020-05-26 DIAGNOSIS — C8230 Follicular lymphoma grade IIIa, unspecified site: Secondary | ICD-10-CM | POA: Insufficient documentation

## 2020-05-26 DIAGNOSIS — Z7984 Long term (current) use of oral hypoglycemic drugs: Secondary | ICD-10-CM | POA: Diagnosis not present

## 2020-05-26 DIAGNOSIS — E669 Obesity, unspecified: Secondary | ICD-10-CM

## 2020-05-26 DIAGNOSIS — Z8616 Personal history of COVID-19: Secondary | ICD-10-CM | POA: Diagnosis not present

## 2020-05-26 DIAGNOSIS — E1122 Type 2 diabetes mellitus with diabetic chronic kidney disease: Secondary | ICD-10-CM | POA: Diagnosis not present

## 2020-05-26 DIAGNOSIS — Z79899 Other long term (current) drug therapy: Secondary | ICD-10-CM | POA: Diagnosis not present

## 2020-05-26 DIAGNOSIS — Z9221 Personal history of antineoplastic chemotherapy: Secondary | ICD-10-CM | POA: Insufficient documentation

## 2020-05-26 DIAGNOSIS — E785 Hyperlipidemia, unspecified: Secondary | ICD-10-CM | POA: Diagnosis not present

## 2020-05-26 DIAGNOSIS — I4891 Unspecified atrial fibrillation: Secondary | ICD-10-CM | POA: Diagnosis not present

## 2020-05-26 LAB — POCT GLYCOSYLATED HEMOGLOBIN (HGB A1C): Hemoglobin A1C: 6.9 % — AB (ref 4.0–5.6)

## 2020-05-26 LAB — CBC WITH DIFFERENTIAL (CANCER CENTER ONLY)
Abs Immature Granulocytes: 0.03 10*3/uL (ref 0.00–0.07)
Basophils Absolute: 0.1 10*3/uL (ref 0.0–0.1)
Basophils Relative: 2 %
Eosinophils Absolute: 0.5 10*3/uL (ref 0.0–0.5)
Eosinophils Relative: 6 %
HCT: 46.4 % (ref 39.0–52.0)
Hemoglobin: 16.1 g/dL (ref 13.0–17.0)
Immature Granulocytes: 0 %
Lymphocytes Relative: 28 %
Lymphs Abs: 2.3 10*3/uL (ref 0.7–4.0)
MCH: 31.7 pg (ref 26.0–34.0)
MCHC: 34.7 g/dL (ref 30.0–36.0)
MCV: 91.3 fL (ref 80.0–100.0)
Monocytes Absolute: 0.8 10*3/uL (ref 0.1–1.0)
Monocytes Relative: 10 %
Neutro Abs: 4.4 10*3/uL (ref 1.7–7.7)
Neutrophils Relative %: 54 %
Platelet Count: 209 10*3/uL (ref 150–400)
RBC: 5.08 MIL/uL (ref 4.22–5.81)
RDW: 12.6 % (ref 11.5–15.5)
WBC Count: 8.1 10*3/uL (ref 4.0–10.5)
nRBC: 0 % (ref 0.0–0.2)

## 2020-05-26 LAB — CMP (CANCER CENTER ONLY)
ALT: 20 U/L (ref 0–44)
AST: 18 U/L (ref 15–41)
Albumin: 4.2 g/dL (ref 3.5–5.0)
Alkaline Phosphatase: 62 U/L (ref 38–126)
Anion gap: 6 (ref 5–15)
BUN: 26 mg/dL — ABNORMAL HIGH (ref 8–23)
CO2: 29 mmol/L (ref 22–32)
Calcium: 10.3 mg/dL (ref 8.9–10.3)
Chloride: 106 mmol/L (ref 98–111)
Creatinine: 1.28 mg/dL — ABNORMAL HIGH (ref 0.61–1.24)
GFR, Estimated: >60 mL/min (ref >60)
Glucose, Bld: 131 mg/dL — ABNORMAL HIGH (ref 70–99)
Potassium: 4 mmol/L (ref 3.5–5.1)
Sodium: 141 mmol/L (ref 135–145)
Total Bilirubin: 0.7 mg/dL (ref 0.3–1.2)
Total Protein: 7.1 g/dL (ref 6.5–8.1)

## 2020-05-26 MED ORDER — GLIPIZIDE 5 MG PO TABS: 5.0000 mg | ORAL_TABLET | Freq: Every day | ORAL | 3 refills | Status: DC

## 2020-05-26 MED ORDER — METFORMIN HCL ER 500 MG PO TB24: 500.0000 mg | ORAL_TABLET | Freq: Two times a day (BID) | ORAL | 3 refills | Status: DC

## 2020-05-26 NOTE — Addendum Note (Signed)
Addended by: Kenyon Ana on: 05/26/2020 05:17 PM   Modules accepted: Orders

## 2020-05-26 NOTE — Progress Notes (Signed)
Hematology and Oncology Follow Up Visit  Tom Fuller [id number] 11-26-1950 68 y.o. 05/26/2020 9:56 AM    CC: Tom Fuller  Tom Pu Cornett, [name]  Tom Fuller   Principle Diagnosis: 69 year old man with stage IIIa follicular lymphoma diagnosed in May 2010.    Past Therapy:  He had a period of observation to be 2010 and 2013.  He is S/P Bendamustine and rituximab started on 08/25/11 for 6 cycles completed in 12/2011 and achieved complete response at this time.  Current therapy: Active surveillance.  Interim History:  Tom Fuller presents today for a follow-up visit.  Since the last visit, he reports no major changes in his health.  He denies any recent hospitalization or illnesses.  He denies any lymphadenopathy or constitutional symptoms.  Continues to be active and attends to activities of daily living including working full-time.    Medications: Reviewed without changes.  Current Outpatient Medications  Medication Sig Dispense Refill  . Accu-Chek FastClix Lancets MISC Use to check fasting blood sugar and 2 hrs after largest meal 200 each 3  . apixaban (ELIQUIS) 5 MG TABS tablet Take 1 tablet (5 mg total) by mouth 2 (two) times daily. 180 tablet 0  . Cholecalciferol (VITAMIN D3) 125 MCG (5000 UT) CAPS Take 1 capsule (5,000 Units total) by mouth daily. 90 capsule 0  . FARXIGA 10 MG TABS tablet TAKE 1 TABLET BY MOUTH EVERY DAY 90 tablet 1  . glucose blood (ACCU-CHEK GUIDE) test strip Use to check fasting blood sugar and 2 hrs after largest meal 100 strip 1  . metFORMIN (GLUCOPHAGE-XR) 500 MG 24 hr tablet Take 1 tablet (500 mg total) by mouth 2 (two) times daily after a meal. Hold until pt. needs it 180 tablet 3  . metoprolol tartrate (LOPRESSOR) 25 MG tablet TAKE 1 TABLET BY MOUTH TWICE A DAY 180 tablet 3  . OZEMPIC, 1 MG/DOSE, 4 MG/3ML SOPN INJECT 0.75 MLS (1 MG TOTAL) INTO THE SKIN ONCE A WEEK. 9 mL 1  . valsartan-hydrochlorothiazide (DIOVAN-HCT) 320-12.5 MG tablet  TAKE 1 TABLET BY MOUTH DAILY 90 tablet 0   No current facility-administered medications for this visit.    Allergies:  Allergies  Allergen Reactions  . Ampicillin Rash        Physical Exam:  Blood pressure (!) 142/96, pulse 79, temperature 97.8 F (36.6 C), temperature source Tympanic, resp. rate 17, height 5\' 10"  (1.778 m), weight 212 lb 14.4 oz (96.6 kg), SpO2 100 %.   ECOG: 0    General appearance: Comfortable appearing without any discomfort Head: Normocephalic without any trauma Oropharynx: Mucous membranes are moist and pink without any thrush or ulcers. Eyes: Pupils are equal and round reactive to light. Lymph nodes: No cervical, supraclavicular, inguinal or axillary lymphadenopathy.   Heart: Regular without any murmurs or gallops. Lung: Clear without any rhonchi or wheezes.  No dullness to percussion. Abdomin: Soft, nontender, nondistended with good bowel sounds.  No hepatosplenomegaly. Musculoskeletal: No joint deformity or effusion.  Full range of motion noted. Neurological: No deficits noted on motor, sensory and deep tendon reflex exam. Skin: No petechial rash or dryness.  Appeared moist.     Lab Results: Lab Results  Component Value Date   WBC 8.1 05/26/2020   HGB 16.1 05/26/2020   HCT 46.4 05/26/2020   MCV 91.3 05/26/2020   PLT 209 05/26/2020       Impression and Plan:  69 year old man with:   1.  Follicular lymphoma diagnosed in  2010.  He was found to have stage IIIa disease that required treatment in 2013.  He continues to be in remission without any evidence of disease relapse.  The natural course of this disease and treatment options including salvage therapies in the future were reviewed.  Oral targeted therapy and repeat systemic chemotherapy would be required if he has disease relapse.  Based on his labs today and physical examination he does not have any evidence of relapsed disease.  He is agreeable to continue with active  surveillance at this time.   2. Atrial fibrillation: No recent exacerbation noted.  Continues to follow with cardiology.  3.  Covid vaccination considerations: He has recovered from Covid infection in August and I recommended to proceed with vaccination series at this time.  I have reiterated the importance of obtaining vaccination after natural infection which will build the best possible immunity to fight any future infections.   4.  Follow-up: He will return in 8 months for follow-up evaluation.  30  minutes were spent on this encounter.  The time was dedicated to reviewing his disease status, discussing treatment options and future plan of care review.     Tom Fuller 12/28/20219:56 AM

## 2020-05-26 NOTE — Patient Instructions (Addendum)
Please continue: - Metformin ER 1000 mg with dinner - Farxiga 10 mg before breakfast - Ozempic 1 mg weekly in am   Please add: - Glipizide 5 mg 30 min before dinner  Please return in 3-4 months with your sugar log.

## 2020-05-26 NOTE — Progress Notes (Addendum)
Patient ID: Tom Fuller, male   DOB: 1951/04/05, 69 y.o.   MRN: [id number]   This visit occurred during the SARS-CoV-2 public health emergency.  Safety protocols were in place, including screening questions prior to the visit, additional usage of staff PPE, and extensive cleaning of exam room while observing appropriate contact time as indicated for disinfecting solutions.   HPI: Tom Fuller is a 69 y.o.-year-old male, presenting for follow-up for DM2, dx in 2017, non-insulin-dependent, uncontrolled, with complications (mild CKD, ED).  Last visit 5 months ago (virtual).  He and his wife had a mild Covid19 form in 12/2019.  Reviewed HbA1c levels: Lab Results  Component Value Date   HGBA1C 6.9 (H) 09/25/2019   HGBA1C 5.9 (A) 08/16/2019   HGBA1C 8.0 (A) 02/08/2019   HGBA1C 8.5 (A) 07/06/2018   HGBA1C 12.1 (H) 01/12/2018   HGBA1C 10.7 (H) 09/19/2017   HGBA1C 8.1 (H) 03/21/2016   HGBA1C 7.9 (H) 10/29/2015   Pt is on a regimen of: - Metformin ER 1000 mg with dinner -started 06/2018 >> 500 mg 2x a day with meals >> 1000 mg with dinner - Farxiga 10 mg before b'fast - Ozempic 0.5 mg weekly in am  - started 02/2018 >> mild constipation >> 1 mg weekly-increased 11/2019 He was on Glipizide 5 mg 2x a day before meals -started 01/2019 >> stopped after adding Ozempic He stopped regular metformin due to abdominal pain  Pt checks his sugars once a day: - am: 93, 97-136, 141, 206 >> 144, 148-164, 177 >> 130-179, 190 - 2h after b'fast: n/c - before lunch: n/c  - 2h after lunch: n/c >> 141 >> n/c - before dinner: n/c >> 142 >> 115-166,  240 - 2h after dinner: 97 >> 70 >> n/c - bedtime: n/c >> 140 >> n/c - nighttime: n/c Lowest sugar was 220 >>... 70 >> 144 >> 115; he has hypoglycemia awareness in the 70s. Highest sugar was 346 >>... 206 >> 177 >> 240.  Glucometer: Accu-Chek  Pt's meals are: - Breakfast: cereal (rice crispies) with milk, boiled eggs - Lunch: banana sandwich, whole wheat -  Dinner: varies - Snacks: 2-3x Despite repeated advice about stopping orange juice and milk, he continues on these.  -+ Mild CKD, last BUN/creatinine:  Lab Results  Component Value Date   BUN 26 (H) 05/26/2020   BUN 24 09/25/2019   CREATININE 1.28 (H) 05/26/2020   CREATININE 1.05 09/25/2019  On valsartan 320.  -+ HL; last set of lipids: Lab Results  Component Value Date   CHOL 118 09/25/2019   HDL 38 (L) 09/25/2019   LDLCALC 65 09/25/2019   TRIG 74 09/25/2019   CHOLHDL 3.1 09/25/2019   - last eye exam was in 2021: No DR reportedly, + cataract.  Before last visit he had an episode of diplopia, resolved.  - no numbness and tingling in his feet.  Pt has FH of DM in PGF.  ROS: Constitutional: no weight gain/no weight loss, no fatigue, no subjective hyperthermia, no subjective hypothermia Eyes: no blurry vision, no xerophthalmia ENT: no sore throat, no nodules palpated in neck, no dysphagia, no odynophagia, no hoarseness Cardiovascular: no CP/no SOB/no palpitations/no leg swelling Respiratory: no cough/no SOB/no wheezing Gastrointestinal: no N/no V/no D/no C/no acid reflux Musculoskeletal: no muscle aches/no joint aches Skin: no rashes, no hair loss Neurological: no tremors/no numbness/no tingling/no dizziness  I reviewed pt's medications, allergies, PMH, social hx, family hx, and changes were documented in the history of present illness. Otherwise,  unchanged from my initial visit note.  Past Medical History:  Diagnosis Date  . Atrial fibrillation ([place])   . Cervical lymphadenopathy   . Erectile dysfunction   . GERD (gastroesophageal reflux disease)   . Hypertension   . Lymphoma, follicular ([place])    Dr.Shadad   Past Surgical History:  Procedure Laterality Date  . a bsc    . ABSCESS DRAINAGE     under rt eye   . DEEP NECK LYMPH NODE BIOPSY / EXCISION  2011  . VASECTOMY     Social History   Socioeconomic History  . Marital status: Married    Spouse name: Not on  file  . Number of children: 2  Occupational History  .  Saw operator  Tobacco Use  . Smoking status: Never Smoker  . Smokeless tobacco: Never Used  Substance and Sexual Activity  . Alcohol use: No  . Drug use: No  . Sexual activity: Yes    Partners: Female   Current Outpatient Medications on File Prior to Visit  Medication Sig Dispense Refill  . Accu-Chek FastClix Lancets MISC Use to check fasting blood sugar and 2 hrs after largest meal 200 each 3  . apixaban (ELIQUIS) 5 MG TABS tablet Take 1 tablet (5 mg total) by mouth 2 (two) times daily. 180 tablet 0  . Cholecalciferol (VITAMIN D3) 125 MCG (5000 UT) CAPS Take 1 capsule (5,000 Units total) by mouth daily. 90 capsule 0  . FARXIGA 10 MG TABS tablet TAKE 1 TABLET BY MOUTH EVERY DAY 90 tablet 1  . glucose blood (ACCU-CHEK GUIDE) test strip Use to check fasting blood sugar and 2 hrs after largest meal 100 strip 1  . metFORMIN (GLUCOPHAGE-XR) 500 MG 24 hr tablet Take 1 tablet (500 mg total) by mouth 2 (two) times daily after a meal. Hold until pt. needs it 180 tablet 3  . metoprolol tartrate (LOPRESSOR) 25 MG tablet TAKE 1 TABLET BY MOUTH TWICE A DAY 180 tablet 3  . OZEMPIC, 1 MG/DOSE, 4 MG/3ML SOPN INJECT 0.75 MLS (1 MG TOTAL) INTO THE SKIN ONCE A WEEK. 9 mL 1  . valsartan-hydrochlorothiazide (DIOVAN-HCT) 320-12.5 MG tablet TAKE 1 TABLET BY MOUTH DAILY 90 tablet 0   No current facility-administered medications on file prior to visit.   Allergies  Allergen Reactions  . Ampicillin Rash   Family History  Problem Relation Age of Onset  . Hypertension Mother   . Kidney disease Mother   . Hypertension Father   . Hypertension Brother   . Diabetes Paternal Grandfather   . Hypertension Paternal Grandfather   . Kidney disease Maternal Aunt   . Kidney disease Maternal Grandmother    PE: BP 120/90   Pulse 88   Ht 5\' 10"  (1.778 m)   Wt 214 lb (97.1 kg)   SpO2 97%   BMI 30.71 kg/m  Wt Readings from Last 3 Encounters:  05/26/20 214  lb (97.1 kg)  05/26/20 212 lb 14.4 oz (96.6 kg)  01/02/20 215 lb (97.5 kg)   Constitutional: overweight, in NAD Eyes: PERRLA, EOMI, no exophthalmos ENT: moist mucous membranes, no thyromegaly, no cervical lymphadenopathy Cardiovascular: RRR, No MRG Respiratory: CTA B Gastrointestinal: abdomen soft, NT, ND, BS+ Musculoskeletal: no deformities, strength intact in all 4 Skin: moist, warm, no rashes Neurological: no tremor with outstretched hands, DTR normal in all 4  ASSESSMENT: 1. DM2, non-insulin-dependent, now controlled, with long-term complications - mild CKD - ED  2. HL  3.  Obesity class I  PLAN:  1. Patient with longstanding, uncontrolled, type 2 diabetes, on oral antidiabetic regimen with Metformin and SGLT2 inhibitor and also weekly GLP-1 receptor agonist, increased at last visit.  We stopped sulfonylurea after adding GLP-1 receptor agonist due to improved blood sugars.  His HbA1c decreased to 5.9% after starting Ozempic.  However, afterwards, sugars started to increase and at last visit they were still high in the morning despite moving the entire Metformin dose with dinner.  He was not checking sugars later in the day and I strongly advised him to do so.  We could not check an HbA1c at last visit since this was a virtual appointment, but this was 6.9% in 08/2019. -At today's visit, his almost exclusively checking blood sugars in the morning.  He has several checks later in the day but they are from last month or even prior to this.  The sugars in the morning are almost all higher than target.  Upon questioning, he is eating late at night, snacking or drinking milk.  We again discussed about the importance of quitting meal (advised him to replace it with almond milk) and also stopping juice.  For now, I advised him to add a low-dose glipizide before dinner to hopefully improve blood sugars after dinner and, subsequently, in the morning. - I suggested to:  Patient Instructions   Please continue: - Metformin ER 1000 mg with dinner - Farxiga 10 mg before breakfast - Ozempic 1 mg weekly in am   Please add: - Glipizide 5 mg 30 min before dinner  Please return in 3-4 months with your sugar log.   - we checked his HbA1c: 6.9% (stable, at goal) - advised to check sugars at different times of the day - 1x a day, rotating check times - advised for yearly eye exams >> he is UTD - return to clinic in 4 months  2. HL -Reviewed latest lipid panel from 08/2019: At goal with exception of a slightly low HDL Lab Results  Component Value Date   CHOL 118 09/25/2019   HDL 38 (L) 09/25/2019   LDLCALC 65 09/25/2019   TRIG 74 09/25/2019   CHOLHDL 3.1 09/25/2019  -He is not on a statin  3.  Obesity class I -continue SGLT 2 inhibitor and GLP-1 receptor agonist which should also help with weight loss -Weight was stable at last visit but lost 8 pounds before that -Lost 9 pounds since our last (virtual) visit  Carlus Pavlov, MD PhD [facility] Endocrinology

## 2020-05-27 ENCOUNTER — Telehealth: Payer: Self-pay | Admitting: Internal Medicine

## 2020-05-27 ENCOUNTER — Telehealth: Payer: Self-pay | Admitting: Oncology

## 2020-05-27 ENCOUNTER — Other Ambulatory Visit: Payer: Self-pay | Admitting: Internal Medicine

## 2020-05-27 MED ORDER — REPAGLINIDE 1 MG PO TABS: 1.0000 mg | ORAL_TABLET | Freq: Every day | ORAL | 11 refills | Status: DC

## 2020-05-27 NOTE — Telephone Encounter (Signed)
Please see below.

## 2020-05-27 NOTE — Telephone Encounter (Signed)
I called in a small supply of low-dose Prandin to see how he does with it.  This is still taken approximately 15 minutes before dinner.

## 2020-05-27 NOTE — Telephone Encounter (Signed)
Called and spoke with patient's wife, patient was not home (went to work). Will have patient call back to follow up regarding blood sugar.

## 2020-05-27 NOTE — Telephone Encounter (Signed)
Patient called stating after he took his Glipizide yesterday evening, he started feeling very hot and "tacky" along with having shakiness and dizziness, he thinks he was having a reaction to it. Please contact patient at ph# [phone] and he said he consents to [place] speaking to his wife.  Blood sugar around 5 am this morning was 116 and he says its normally higher than that.

## 2020-05-27 NOTE — Telephone Encounter (Signed)
T, can you please find out whether he had a low blood sugar at the time of his sxs.  If not, we can switch to another medication, Prandil.  Please let me know.

## 2020-05-27 NOTE — Telephone Encounter (Signed)
Called and spoke with patient who advised he was not monitoring his blood sugar at the time of symptoms. His numbers prior to the symptoms were within his normal range and he took it as prescribe before dinner. Advised patient we will send in an alternative Rx to pharmacy for Prandil. Patient verbalized understanding.

## 2020-05-27 NOTE — Telephone Encounter (Signed)
Called and relayed directions to patient's wife. Patient (and wife) advised to call back with any questions or concerns.

## 2020-05-27 NOTE — Telephone Encounter (Signed)
Scheduled per 12/28 los, patient has been called and notified.  

## 2020-05-28 ENCOUNTER — Ambulatory Visit: Payer: BC Managed Care – PPO | Admitting: Internal Medicine

## 2020-06-17 ENCOUNTER — Other Ambulatory Visit: Payer: Self-pay | Admitting: Internal Medicine

## 2020-06-17 DIAGNOSIS — I480 Paroxysmal atrial fibrillation: Secondary | ICD-10-CM

## 2020-06-17 NOTE — Telephone Encounter (Signed)
13m 97.1kg Scr 1.28 05/26/20 Lovw/taylor 12/25/19

## 2020-06-18 ENCOUNTER — Other Ambulatory Visit: Payer: Self-pay | Admitting: Internal Medicine

## 2020-06-18 DIAGNOSIS — I480 Paroxysmal atrial fibrillation: Secondary | ICD-10-CM

## 2020-06-18 NOTE — Telephone Encounter (Signed)
We do not have any insurance on file for pt that's required to submit prior authorization. Called pt's pharmacy to obtain.  [employer]: ID [id number] BIN [id number] PCN [id number] GRP [id number]  Prior authorization submitted.

## 2020-06-19 ENCOUNTER — Telehealth: Payer: Self-pay | Admitting: Pharmacist

## 2020-06-19 NOTE — Telephone Encounter (Signed)
Eliquis level 1 appeals submitted today.

## 2020-06-24 NOTE — Telephone Encounter (Signed)
Eliquis approved through 06/24/21. Called and spoke with pt's wife who is aware.

## 2020-06-29 ENCOUNTER — Telehealth: Payer: Self-pay

## 2020-06-29 NOTE — Telephone Encounter (Addendum)
Called patient and left a message with wife for patient to call back. I spoke with [facility] who states that there is a card on file that they ran with the prescription but it is coming back saying $200 copay for 90 days.  Seems to possible be an issue with the copay card. Pharmacist is now saying it says non-match cardholder ID I have asked the pharmacy to call the [phone] to see what the issue is If that does not work, patient may need to call [phone]

## 2020-06-29 NOTE — Telephone Encounter (Signed)
Patient returned call. I advised that he call his pharmacy tomorrow to see if the issue was fixed. I also gave him the [id number] # incase he needs it. Advised patient to let me know tomorrow if it isn't resolved and we will give him samples as he takes his last one tonight.

## 2020-06-29 NOTE — Telephone Encounter (Signed)
Pt calling stating that his insurance is not covering his medication Eliquis anymore. Pt would like a alternative. I informed pt that I would send message to our coordinator that handles prior auth. Pt stated that would be fine. Jeani Hawking, LPN, could you please advise on this matter? Thanks

## 2020-07-17 ENCOUNTER — Other Ambulatory Visit: Payer: Self-pay | Admitting: Physician Assistant

## 2020-07-17 DIAGNOSIS — E1159 Type 2 diabetes mellitus with other circulatory complications: Secondary | ICD-10-CM

## 2020-07-17 DIAGNOSIS — I152 Hypertension secondary to endocrine disorders: Secondary | ICD-10-CM

## 2020-07-19 ENCOUNTER — Telehealth: Payer: Self-pay | Admitting: Physician Assistant

## 2020-07-19 ENCOUNTER — Other Ambulatory Visit: Payer: Self-pay | Admitting: Physician Assistant

## 2020-07-19 DIAGNOSIS — E1159 Type 2 diabetes mellitus with other circulatory complications: Secondary | ICD-10-CM

## 2020-07-19 DIAGNOSIS — I152 Hypertension secondary to endocrine disorders: Secondary | ICD-10-CM

## 2020-07-19 NOTE — Telephone Encounter (Signed)
Please contact pt to schedule per last AVS for further med refills. AS, CMA 

## 2020-07-20 NOTE — Telephone Encounter (Signed)
Patient scheduled for March 7th

## 2020-08-03 ENCOUNTER — Encounter: Payer: Self-pay | Admitting: Physician Assistant

## 2020-08-03 ENCOUNTER — Ambulatory Visit (INDEPENDENT_AMBULATORY_CARE_PROVIDER_SITE_OTHER): Payer: BC Managed Care – PPO | Admitting: Physician Assistant

## 2020-08-03 ENCOUNTER — Other Ambulatory Visit: Payer: Self-pay

## 2020-08-03 VITALS — BP 115/70 | HR 91 | Temp 98.3°F | Ht 70.0 in | Wt 221.4 lb

## 2020-08-03 DIAGNOSIS — E785 Hyperlipidemia, unspecified: Secondary | ICD-10-CM

## 2020-08-03 DIAGNOSIS — E1169 Type 2 diabetes mellitus with other specified complication: Secondary | ICD-10-CM | POA: Diagnosis not present

## 2020-08-03 DIAGNOSIS — C8280 Other types of follicular lymphoma, unspecified site: Secondary | ICD-10-CM

## 2020-08-03 DIAGNOSIS — E1151 Type 2 diabetes mellitus with diabetic peripheral angiopathy without gangrene: Secondary | ICD-10-CM | POA: Diagnosis not present

## 2020-08-03 DIAGNOSIS — E1165 Type 2 diabetes mellitus with hyperglycemia: Secondary | ICD-10-CM

## 2020-08-03 DIAGNOSIS — IMO0002 Reserved for concepts with insufficient information to code with codable children: Secondary | ICD-10-CM

## 2020-08-03 DIAGNOSIS — I480 Paroxysmal atrial fibrillation: Secondary | ICD-10-CM | POA: Diagnosis not present

## 2020-08-03 DIAGNOSIS — R252 Cramp and spasm: Secondary | ICD-10-CM

## 2020-08-03 DIAGNOSIS — E1159 Type 2 diabetes mellitus with other circulatory complications: Secondary | ICD-10-CM | POA: Diagnosis not present

## 2020-08-03 DIAGNOSIS — I152 Hypertension secondary to endocrine disorders: Secondary | ICD-10-CM

## 2020-08-03 NOTE — Assessment & Plan Note (Signed)
-  On Apixaban 5 mg. -Irregular rhythm on exam today. Asymptomatic. -Recommend to follow up with Cardiology.

## 2020-08-03 NOTE — Assessment & Plan Note (Signed)
-  Followed by Endocrinology. -Last A1c 6.9, stable. -Recommend to continue current medication regimen and improve adherence with Prandin. -Continue ambulatory glucose monitoring and keep a log. -Recommend to reduce sugar (sodas) and carbohydrates. Increase physical activity.

## 2020-08-03 NOTE — Assessment & Plan Note (Deleted)
-  Controlled. -Continue current medication regimen. Last CMP: Cr 1.28, GFR >60 -Recommend to increase water intake, at least 64 fl oz.  -Follow low sodium diet. -Will continue to monitor.

## 2020-08-03 NOTE — Progress Notes (Signed)
Established Patient Office Visit  Subjective:  Patient ID: Tom Fuller, male    DOB: January 29, 1951  Age: 70 y.o. MRN: [id number]  CC:  Chief Complaint  Patient presents with  . Hypertension  . Diabetes    HPI Tom Fuller presents for follow up on diabetes mellitus, hypertension, and hyperlipidemia. Today has c/o muscle cramps of both thighs. States muscles will tense up.   Diabetes: Followed by Endocrinology. Pt denies increased urination or thirst. Pt reports medication compliance with most medications but has been forgetting to take new medication- Prandin. No hypoglycemic events. Checking glucose at home. Brings sugar log for this month and two FBS readings are 126, 145.  States is not the best patient and does not monitor carbohydrates and glucose as should. With work is challenging to check sugar several time throughout the day but is trying.   HTN: Pt denies chest pain, palpitations, dizziness or lower extremity swelling. Taking medication as directed without side effects. Checks BP at home and brings BP log, readings range from 111-130s/60-80s with highest systolic reading 425Z. Pt does not monitor sodium intake. Drinks about 1 water bottle (16.9 fl oz) per day and verbalizes the need to increase water intake.  HLD: Pt not on medication. Limits red meat and fried foods, eats chicken, fruits and some vegetables. Does not exercise or do physical activity besides work.    Past Medical History:  Diagnosis Date  . Atrial fibrillation ([place])   . Cervical lymphadenopathy   . Erectile dysfunction   . GERD (gastroesophageal reflux disease)   . Hypertension   . Lymphoma, follicular ([place])    Dr.Shadad    Past Surgical History:  Procedure Laterality Date  . a bsc    . ABSCESS DRAINAGE     under rt eye   . DEEP NECK LYMPH NODE BIOPSY / EXCISION  2011  . VASECTOMY      Family History  Problem Relation Age of Onset  . Hypertension Mother   . Kidney disease Mother   .  Hypertension Father   . Hypertension Brother   . Diabetes Paternal Grandfather   . Hypertension Paternal Grandfather   . Kidney disease Maternal Aunt   . Kidney disease Maternal Grandmother     Social History   Socioeconomic History  . Marital status: Married    Spouse name: Not on file  . Number of children: Not on file  . Years of education: Not on file  . Highest education level: Not on file  Occupational History  . Not on file  Tobacco Use  . Smoking status: Never Smoker  . Smokeless tobacco: Never Used  Substance and Sexual Activity  . Alcohol use: No  . Drug use: No  . Sexual activity: Yes    Partners: Female  Other Topics Concern  . Not on file  Social History Narrative  . Not on file   Social Determinants of Health   Financial Resource Strain: Not on file  Food Insecurity: Not on file  Transportation Needs: Not on file  Physical Activity: Not on file  Stress: Not on file  Social Connections: Not on file  Intimate Partner Violence: Not on file    Outpatient Medications Prior to Visit  Medication Sig Dispense Refill  . Accu-Chek FastClix Lancets MISC Use to check fasting blood sugar and 2 hrs after largest meal 200 each 3  . Cholecalciferol (VITAMIN D3) 125 MCG (5000 UT) CAPS Take 1 capsule (5,000 Units total) by mouth daily.  90 capsule 0  . ELIQUIS 5 MG TABS tablet TAKE 1 TABLET BY MOUTH TWICE A DAY 180 tablet 1  . FARXIGA 10 MG TABS tablet TAKE 1 TABLET BY MOUTH EVERY DAY 90 tablet 1  . glucose blood (ACCU-CHEK GUIDE) test strip Use to check fasting blood sugar and 2 hrs after largest meal 100 strip 1  . metFORMIN (GLUCOPHAGE-XR) 500 MG 24 hr tablet Take 1 tablet (500 mg total) by mouth 2 (two) times daily after a meal. Hold until pt. needs it 180 tablet 3  . metoprolol tartrate (LOPRESSOR) 25 MG tablet TAKE 1 TABLET BY MOUTH TWICE A DAY 180 tablet 3  . OZEMPIC, 1 MG/DOSE, 4 MG/3ML SOPN INJECT 0.75 MLS (1 MG TOTAL) INTO THE SKIN ONCE A WEEK. 9 mL 1  .  repaglinide (PRANDIN) 1 MG tablet Take 1 tablet (1 mg total) by mouth daily before supper. 30 tablet 11  . valsartan-hydrochlorothiazide (DIOVAN-HCT) 320-12.5 MG tablet **NEEDS APT FOR REFILLS**TAKE 1 TABLET BY MOUTH EVERY DAY 60 tablet 0  . glipiZIDE (GLUCOTROL) 5 MG tablet Take 1 tablet (5 mg total) by mouth daily before supper. (Patient not taking: Reported on 08/03/2020) 90 tablet 3   No facility-administered medications prior to visit.    Allergies  Allergen Reactions  . Ampicillin Rash    ROS Review of Systems A fourteen system review of systems was performed and found to be positive as per HPI.   Objective:    Physical Exam General:  Well Developed, well nourished, in no acute distress  Neuro:  Alert and oriented,  extra-ocular muscles intact  HEENT:  Normocephalic, atraumatic, neck supple Skin:  no gross rash, warm, pink. Cardiac:  Irregular rhythm Respiratory:  ECTA B/L w/o wheezing , Not using accessory muscles, speaking in full sentences- unlabored. Vascular:  Ext warm, no cyanosis apprec.; no edema  Psych:  No HI/SI, judgement and insight good, Euthymic mood. Full Affect.  BP 115/70   Pulse 91   Temp 98.3 F (36.8 C)   Ht $R'5\' 10"'Uj$  (1.778 m)   Wt 221 lb 6.4 oz (100.4 kg)   SpO2 98%   BMI 31.77 kg/m  Wt Readings from Last 3 Encounters:  08/03/20 221 lb 6.4 oz (100.4 kg)  05/26/20 214 lb (97.1 kg)  05/26/20 212 lb 14.4 oz (96.6 kg)     Health Maintenance Due  Topic Date Due  . OPHTHALMOLOGY EXAM  Never done  . COVID-19 Vaccine (1) Never done  . FOOT EXAM  01/13/2019  . PNA vac Low Risk Adult (2 of 2 - PPSV23) 07/21/2019  . INFLUENZA VACCINE  12/29/2019    There are no preventive care reminders to display for this patient.  Lab Results  Component Value Date   TSH 1.830 09/25/2019   Lab Results  Component Value Date   WBC 8.1 05/26/2020   HGB 16.1 05/26/2020   HCT 46.4 05/26/2020   MCV 91.3 05/26/2020   PLT 209 05/26/2020   Lab Results  Component  Value Date   NA 141 05/26/2020   K 4.0 05/26/2020   CHLORIDE 100 03/10/2017   CO2 29 05/26/2020   GLUCOSE 131 (H) 05/26/2020   BUN 26 (H) 05/26/2020   CREATININE 1.28 (H) 05/26/2020   BILITOT 0.7 05/26/2020   ALKPHOS 62 05/26/2020   AST 18 05/26/2020   ALT 20 05/26/2020   PROT 7.1 05/26/2020   ALBUMIN 4.2 05/26/2020   CALCIUM 10.3 05/26/2020   ANIONGAP 6 05/26/2020   EGFR >60 03/10/2017  GFR 77.68 04/16/2018   Lab Results  Component Value Date   CHOL 118 09/25/2019   Lab Results  Component Value Date   HDL 38 (L) 09/25/2019   Lab Results  Component Value Date   LDLCALC 65 09/25/2019   Lab Results  Component Value Date   TRIG 74 09/25/2019   Lab Results  Component Value Date   CHOLHDL 3.1 09/25/2019   Lab Results  Component Value Date   HGBA1C 6.9 (A) 05/26/2020      Assessment & Plan:   Problem List Items Addressed This Visit      Cardiovascular and Mediastinum   ATRIAL FIBRILLATION    -On Apixaban 5 mg. -Irregular rhythm on exam today. Asymptomatic. -Recommend to follow up with Cardiology.       DM (diabetes mellitus) type II uncontrolled, periph vascular disorder ([place]) - Primary    -Followed by Endocrinology. -Last A1c 6.9, stable. -Recommend to continue current medication regimen and improve adherence with Prandin. -Continue ambulatory glucose monitoring and keep a log. -Recommend to reduce sugar (sodas) and carbohydrates. Increase physical activity.         Other   Lymphoma ([place])    Other Visit Diagnoses    Hypertension associated with diabetes ([place])       Hyperlipidemia associated with type 2 diabetes mellitus ([place])         Lymphoma: -Followed by Oncology, Dr. Alen Blew. -Last CBC wnl  Hyperlipidemia associated with type 2 diabetes mellitus: --Last lipid panel: total cholesterol 118, triglycerides 74, HDL 38, LDL 65 (at goal <70). -Recommend to follow a diet low in saturated and trans fats, increase physical activity such as brisk  walking 10-15 minutes daily and gradually increase to ultimate goal to 150 minutes/wk.  -Advised patient to schedule lab visit for FBW to repeat lipid panel before next OV.  Hypertension associated with diabetes: -Controlled. -Continue current medication regimen. Last CMP: Cr 1.28, GFR >60 -Recommend to increase water intake, at least 64 fl oz.  -Follow low sodium diet. -Will continue to monitor.  Muscle cramps in legs:  -Discussed with patient potential etiologies including dehydration, medication side effect and electrolyte imbalance. -Recommend to increase water hydration. -Advised patient to schedule lab visit for FBW and will include magnesium to evaluate potential etiologies.    No orders of the defined types were placed in this encounter.   Follow-up: Return in about 4 months (around 12/03/2020) for DM, HTN, HLD; lab visit for FBW include magnesium before next appt..   Note:  This note was prepared with assistance of Dragon voice recognition software. Occasional wrong-word or sound-a-like substitutions may have occurred due to the inherent limitations of voice recognition software.   Lorrene Reid, PA-C

## 2020-08-03 NOTE — Patient Instructions (Signed)
Heart-Healthy Eating Plan Heart-healthy meal planning includes:  Eating less unhealthy fats.  Eating more healthy fats.  Making other changes in your diet. Talk with your doctor or a diet specialist (dietitian) to create an eating plan that is right for you. What is my plan? Your doctor may recommend an eating plan that includes:  Total fat: ______% or less of total calories a day.  Saturated fat: ______% or less of total calories a day.  Cholesterol: less than _________mg a day. What are tips for following this plan? Cooking Avoid frying your food. Try to bake, boil, grill, or broil it instead. You can also reduce fat by:  Removing the skin from poultry.  Removing all visible fats from meats.  Steaming vegetables in water or broth. Meal planning  At meals, divide your plate into four equal parts: ? Fill one-half of your plate with vegetables and green salads. ? Fill one-fourth of your plate with whole grains. ? Fill one-fourth of your plate with lean protein foods.  Eat 4-5 servings of vegetables per day. A serving of vegetables is: ? 1 cup of raw or cooked vegetables. ? 2 cups of raw leafy greens.  Eat 4-5 servings of fruit per day. A serving of fruit is: ? 1 medium whole fruit. ?  cup of dried fruit. ?  cup of fresh, frozen, or canned fruit. ?  cup of 100% fruit juice.  Eat more foods that have soluble fiber. These are apples, broccoli, carrots, beans, peas, and barley. Try to get 20-30 g of fiber per day.  Eat 4-5 servings of nuts, legumes, and seeds per week: ? 1 serving of dried beans or legumes equals  cup after being cooked. ? 1 serving of nuts is  cup. ? 1 serving of seeds equals 1 tablespoon.   General information  Eat more home-cooked food. Eat less restaurant, buffet, and fast food.  Limit or avoid alcohol.  Limit foods that are high in starch and sugar.  Avoid fried foods.  Lose weight if you are overweight.  Keep track of how much  salt (sodium) you eat. This is important if you have high blood pressure. Ask your doctor to tell you more about this.  Try to add vegetarian meals each week. Fats  Choose healthy fats. These include olive oil and canola oil, flaxseeds, walnuts, almonds, and seeds.  Eat more omega-3 fats. These include salmon, mackerel, sardines, tuna, flaxseed oil, and ground flaxseeds. Try to eat fish at least 2 times each week.  Check food labels. Avoid foods with trans fats or high amounts of saturated fat.  Limit saturated fats. ? These are often found in animal products, such as meats, butter, and cream. ? These are also found in plant foods, such as palm oil, palm kernel oil, and coconut oil.  Avoid foods with partially hydrogenated oils in them. These have trans fats. Examples are stick margarine, some tub margarines, cookies, crackers, and other baked goods. What foods can I eat? Fruits All fresh, canned (in natural juice), or frozen fruits. Vegetables Fresh or frozen vegetables (raw, steamed, roasted, or grilled). Green salads. Grains Most grains. Choose whole wheat and whole grains most of the time. Rice and pasta, including brown rice and pastas made with whole wheat. Meats and other proteins Lean, well-trimmed beef, veal, pork, and lamb. Chicken and [place] without skin. All fish and shellfish. Wild duck, rabbit, pheasant, and venison. Egg whites or low-cholesterol egg substitutes. Dried beans, peas, lentils, and tofu. Seeds  and most nuts. Dairy Low-fat or nonfat cheeses, including ricotta and mozzarella. Skim or 1% milk that is liquid, powdered, or evaporated. Buttermilk that is made with low-fat milk. Nonfat or low-fat yogurt. Fats and oils Non-hydrogenated (trans-free) margarines. Vegetable oils, including soybean, sesame, sunflower, olive, peanut, safflower, corn, canola, and cottonseed. Salad dressings or mayonnaise made with a vegetable oil. Beverages Mineral water. Coffee and tea.  Diet carbonated beverages. Sweets and desserts Sherbet, gelatin, and fruit ice. Small amounts of dark chocolate. Limit all sweets and desserts. Seasonings and condiments All seasonings and condiments. The items listed above may not be a complete list of foods and drinks you can eat. Contact a dietitian for more options. What foods should I avoid? Fruits Canned fruit in heavy syrup. Fruit in cream or butter sauce. Fried fruit. Limit coconut. Vegetables Vegetables cooked in cheese, cream, or butter sauce. Fried vegetables. Grains Breads that are made with saturated or trans fats, oils, or whole milk. Croissants. Sweet rolls. Donuts. High-fat crackers, such as cheese crackers. Meats and other proteins Fatty meats, such as hot dogs, ribs, sausage, bacon, rib-eye roast or steak. High-fat deli meats, such as salami and bologna. Caviar. Domestic duck and goose. Organ meats, such as liver. Dairy Cream, sour cream, cream cheese, and creamed cottage cheese. Whole-milk cheeses. Whole or 2% milk that is liquid, evaporated, or condensed. Whole buttermilk. Cream sauce or high-fat cheese sauce. Yogurt that is made from whole milk. Fats and oils Meat fat, or shortening. Cocoa butter, hydrogenated oils, palm oil, coconut oil, palm kernel oil. Solid fats and shortenings, including bacon fat, salt pork, lard, and butter. Nondairy cream substitutes. Salad dressings with cheese or sour cream. Beverages Regular sodas and juice drinks with added sugar. Sweets and desserts Frosting. Pudding. Cookies. Cakes. Pies. Milk chocolate or white chocolate. Buttered syrups. Full-fat ice cream or ice cream drinks. The items listed above may not be a complete list of foods and drinks to avoid. Contact a dietitian for more information. Summary  Heart-healthy meal planning includes eating less unhealthy fats, eating more healthy fats, and making other changes in your diet.  Eat a balanced diet. This includes fruits and  vegetables, low-fat or nonfat dairy, lean protein, nuts and legumes, whole grains, and heart-healthy oils and fats. This information is not intended to replace advice given to you by your health care provider. Make sure you discuss any questions you have with your health care provider. Document Revised: 07/20/2017 Document Reviewed: 06/23/2017 Elsevier Patient Education  2021 [place].   Diabetes Mellitus and Nutrition, Adult When you have diabetes, or diabetes mellitus, it is very important to have healthy eating habits because your blood sugar (glucose) levels are greatly affected by what you eat and drink. Eating healthy foods in the right amounts, at about the same times every day, can help you:  Control your blood glucose.  Lower your risk of heart disease.  Improve your blood pressure.  Reach or maintain a healthy weight. What can affect my meal plan? Every person with diabetes is different, and each person has different needs for a meal plan. Your health care provider may recommend that you work with a dietitian to make a meal plan that is best for you. Your meal plan may vary depending on factors such as:  The calories you need.  The medicines you take.  Your weight.  Your blood glucose, blood pressure, and cholesterol levels.  Your activity level.  Other health conditions you have, such as heart or kidney  disease. How do carbohydrates affect me? Carbohydrates, also called carbs, affect your blood glucose level more than any other type of food. Eating carbs naturally raises the amount of glucose in your blood. Carb counting is a method for keeping track of how many carbs you eat. Counting carbs is important to keep your blood glucose at a healthy level, especially if you use insulin or take certain oral diabetes medicines. It is important to know how many carbs you can safely have in each meal. This is different for every person. Your dietitian can help you calculate how  many carbs you should have at each meal and for each snack. How does alcohol affect me? Alcohol can cause a sudden decrease in blood glucose (hypoglycemia), especially if you use insulin or take certain oral diabetes medicines. Hypoglycemia can be a life-threatening condition. Symptoms of hypoglycemia, such as sleepiness, dizziness, and confusion, are similar to symptoms of having too much alcohol.  Do not drink alcohol if: ? Your health care provider tells you not to drink. ? You are pregnant, may be pregnant, or are planning to become pregnant.  If you drink alcohol: ? Do not drink on an empty stomach. ? Limit how much you use to:  0-1 drink a day for women.  0-2 drinks a day for men. ? Be aware of how much alcohol is in your drink. In the [place] one drink equals one 12 oz bottle of beer (355 mL), one 5 oz glass of wine (148 mL), or one 1 oz glass of hard liquor (44 mL). ? Keep yourself hydrated with water, diet soda, or unsweetened iced tea.  Keep in mind that regular soda, juice, and other mixers may contain a lot of sugar and must be counted as carbs. What are tips for following this plan? Reading food labels  Start by checking the serving size on the "Nutrition Facts" label of packaged foods and drinks. The amount of calories, carbs, fats, and other nutrients listed on the label is based on one serving of the item. Many items contain more than one serving per package.  Check the total grams (g) of carbs in one serving. You can calculate the number of servings of carbs in one serving by dividing the total carbs by 15. For example, if a food has 30 g of total carbs per serving, it would be equal to 2 servings of carbs.  Check the number of grams (g) of saturated fats and trans fats in one serving. Choose foods that have a low amount or none of these fats.  Check the number of milligrams (mg) of salt (sodium) in one serving. Most people should limit total sodium intake to less than  2,300 mg per day.  Always check the nutrition information of foods labeled as "low-fat" or "nonfat." These foods may be higher in added sugar or refined carbs and should be avoided.  Talk to your dietitian to identify your daily goals for nutrients listed on the label. Shopping  Avoid buying canned, pre-made, or processed foods. These foods tend to be high in fat, sodium, and added sugar.  Shop around the outside edge of the grocery store. This is where you will most often find fresh fruits and vegetables, bulk grains, fresh meats, and fresh dairy. Cooking  Use low-heat cooking methods, such as baking, instead of high-heat cooking methods like deep frying.  Cook using healthy oils, such as olive, canola, or sunflower oil.  Avoid cooking with butter, cream, or high-fat meats.  Meal planning  Eat meals and snacks regularly, preferably at the same times every day. Avoid going long periods of time without eating.  Eat foods that are high in fiber, such as fresh fruits, vegetables, beans, and whole grains. Talk with your dietitian about how many servings of carbs you can eat at each meal.  Eat 4-6 oz (112-168 g) of lean protein each day, such as lean meat, chicken, fish, eggs, or tofu. One ounce (oz) of lean protein is equal to: ? 1 oz (28 g) of meat, chicken, or fish. ? 1 egg. ?  cup (62 g) of tofu.  Eat some foods each day that contain healthy fats, such as avocado, nuts, seeds, and fish.   What foods should I eat? Fruits Berries. Apples. Oranges. Peaches. Apricots. Plums. Grapes. Mango. Papaya. Pomegranate. Kiwi. Cherries. Vegetables Lettuce. Spinach. Leafy greens, including kale, chard, collard greens, and mustard greens. Beets. Cauliflower. Cabbage. Broccoli. Carrots. Green beans. Tomatoes. Peppers. Onions. Cucumbers. Brussels sprouts. Grains Whole grains, such as whole-wheat or whole-grain bread, crackers, tortillas, cereal, and pasta. Unsweetened oatmeal. Quinoa. Brown or wild  rice. Meats and other proteins Seafood. Poultry without skin. Lean cuts of poultry and beef. Tofu. Nuts. Seeds. Dairy Low-fat or fat-free dairy products such as milk, yogurt, and cheese. The items listed above may not be a complete list of foods and beverages you can eat. Contact a dietitian for more information. What foods should I avoid? Fruits Fruits canned with syrup. Vegetables Canned vegetables. Frozen vegetables with butter or cream sauce. Grains Refined white flour and flour products such as bread, pasta, snack foods, and cereals. Avoid all processed foods. Meats and other proteins Fatty cuts of meat. Poultry with skin. Breaded or fried meats. Processed meat. Avoid saturated fats. Dairy Full-fat yogurt, cheese, or milk. Beverages Sweetened drinks, such as soda or iced tea. The items listed above may not be a complete list of foods and beverages you should avoid. Contact a dietitian for more information. Questions to ask a health care provider  Do I need to meet with a diabetes educator?  Do I need to meet with a dietitian?  What number can I call if I have questions?  When are the best times to check my blood glucose? Where to find more information:  American Diabetes Association: diabetes.org  Academy of Nutrition and Dietetics: www.eatright.[employer] of Diabetes and Digestive and Kidney Diseases: [employer]  Association of Diabetes Care and Education Specialists: www.diabeteseducator.org Summary  It is important to have healthy eating habits because your blood sugar (glucose) levels are greatly affected by what you eat and drink.  A healthy meal plan will help you control your blood glucose and maintain a healthy lifestyle.  Your health care provider may recommend that you work with a dietitian to make a meal plan that is best for you.  Keep in mind that carbohydrates (carbs) and alcohol have immediate effects on your blood glucose levels.  It is important to count carbs and to use alcohol carefully. This information is not intended to replace advice given to you by your health care provider. Make sure you discuss any questions you have with your health care provider. Document Revised: 04/23/2019 Document Reviewed: 04/23/2019 Elsevier Patient Education  2021 [employer].

## 2020-08-03 NOTE — Assessment & Plan Note (Deleted)
-  Last lipid panel: total cholesterol 118, triglycerides 74, HDL 38, LDL 65 (at goal <70). -Recommend to follow a diet low in saturated and trans fats, increase physical activity such as brisk walking 10-15 minutes daily and gradually increase to ultimate goal to 150 minutes/wk.  -Advised patient to schedule lab visit for FBW to repeat lipid panel before next OV.

## 2020-08-03 NOTE — Assessment & Plan Note (Signed)
>>  ASSESSMENT AND PLAN FOR DIABETES MELLITUS WITH HYPERGLYCEMIA ([place]) WRITTEN ON 08/03/2020  4:55 PM BY ABONZA, MARITZA, PA-C  -Followed by Endocrinology. -Last A1c 6.9, stable. -Recommend to continue current medication regimen and improve adherence with Prandin. -Continue ambulatory glucose monitoring and keep a log. -Recommend to reduce sugar (sodas) and carbohydrates. Increase physical activity.

## 2020-08-22 ENCOUNTER — Other Ambulatory Visit: Payer: Self-pay | Admitting: Physician Assistant

## 2020-09-14 ENCOUNTER — Other Ambulatory Visit: Payer: Self-pay | Admitting: Internal Medicine

## 2020-09-14 DIAGNOSIS — E1151 Type 2 diabetes mellitus with diabetic peripheral angiopathy without gangrene: Secondary | ICD-10-CM

## 2020-09-14 DIAGNOSIS — IMO0002 Reserved for concepts with insufficient information to code with codable children: Secondary | ICD-10-CM

## 2020-09-24 ENCOUNTER — Other Ambulatory Visit: Payer: Self-pay

## 2020-09-24 ENCOUNTER — Encounter: Payer: Self-pay | Admitting: Internal Medicine

## 2020-09-24 ENCOUNTER — Ambulatory Visit: Payer: BC Managed Care – PPO | Admitting: Internal Medicine

## 2020-09-24 VITALS — BP 130/90 | HR 71 | Ht 70.0 in | Wt 219.2 lb

## 2020-09-24 DIAGNOSIS — IMO0002 Reserved for concepts with insufficient information to code with codable children: Secondary | ICD-10-CM

## 2020-09-24 DIAGNOSIS — E1151 Type 2 diabetes mellitus with diabetic peripheral angiopathy without gangrene: Secondary | ICD-10-CM | POA: Diagnosis not present

## 2020-09-24 DIAGNOSIS — E785 Hyperlipidemia, unspecified: Secondary | ICD-10-CM | POA: Diagnosis not present

## 2020-09-24 DIAGNOSIS — E1165 Type 2 diabetes mellitus with hyperglycemia: Secondary | ICD-10-CM

## 2020-09-24 DIAGNOSIS — E1122 Type 2 diabetes mellitus with diabetic chronic kidney disease: Secondary | ICD-10-CM

## 2020-09-24 DIAGNOSIS — E669 Obesity, unspecified: Secondary | ICD-10-CM

## 2020-09-24 LAB — POCT GLYCOSYLATED HEMOGLOBIN (HGB A1C): Hemoglobin A1C: 6.1 % — AB (ref 4.0–5.6)

## 2020-09-24 MED ORDER — OZEMPIC (1 MG/DOSE) 4 MG/3ML ~~LOC~~ SOPN: 1.0000 mg | PEN_INJECTOR | SUBCUTANEOUS | 3 refills | Status: DC

## 2020-09-24 MED ORDER — DAPAGLIFLOZIN PROPANEDIOL 10 MG PO TABS: 10.0000 mg | ORAL_TABLET | Freq: Every day | ORAL | 3 refills | Status: DC

## 2020-09-24 MED ORDER — METFORMIN HCL ER 500 MG PO TB24: 1000.0000 mg | ORAL_TABLET | Freq: Every day | ORAL | 3 refills | Status: DC

## 2020-09-24 MED ORDER — REPAGLINIDE 1 MG PO TABS: 1.0000 mg | ORAL_TABLET | Freq: Every day | ORAL | 3 refills | Status: DC

## 2020-09-24 NOTE — Patient Instructions (Addendum)
Please continue: - Metformin ER 1000 mg with dinner - Prandin 1 mg 30 min before dinner - Farxiga 10 mg before breakfast - Ozempic 1 mg weekly in am   Please return in 4 months with your sugar log.

## 2020-09-24 NOTE — Progress Notes (Signed)
Patient ID: Tom Fuller, male   DOB: January 18, 1951, 70 y.o.   MRN: [id number]   This visit occurred during the SARS-CoV-2 public health emergency.  Safety protocols were in place, including screening questions prior to the visit, additional usage of staff PPE, and extensive cleaning of exam room while observing appropriate contact time as indicated for disinfecting solutions.   HPI: Tom Fuller is a 70 y.o.-year-old male, presenting for follow-up for DM2, dx in 2017, non-insulin-dependent, uncontrolled, with complications (mild CKD, ED).  Last visit 4 months ago.  Interim history: He has been doing well since last visit, without new complaints.  Sugars have been better.  Reviewed HbA1c levels: Lab Results  Component Value Date   HGBA1C 6.9 (A) 05/26/2020   HGBA1C 6.9 (H) 09/25/2019   HGBA1C 5.9 (A) 08/16/2019   HGBA1C 8.0 (A) 02/08/2019   HGBA1C 8.5 (A) 07/06/2018   HGBA1C 12.1 (H) 01/12/2018   HGBA1C 10.7 (H) 09/19/2017   HGBA1C 8.1 (H) 03/21/2016   HGBA1C 7.9 (H) 10/29/2015   Pt is on a regimen of: - Metformin ER 1000 mg with dinner -started 06/2018 >> 500 mg 2x a day with meals >> 1000 mg with dinner - Farxiga 10 mg before b'fast - Glipizide 5 mg before dinner >> Prandin 1 mg before dinner - Ozempic 0.5 mg weekly in am  - started 02/2018 >> mild constipation >> 1 mg weekly-increased 11/2019 He was on Glipizide 5 mg 2x a day before meals -started 01/2019 >> stopped after adding Ozempic He stopped regular metformin due to abdominal pain  Pt checks his sugars once a day: - am: 93, 97-136, 141, 206 >> 144, 148-164, 177 >> 130-179, 190 >> 105-145, 171 - 2h after b'fast: n/c - before lunch: n/c >> 119 - 2h after lunch: n/c >> 141 >> n/c - before dinner: n/c >> 142 >> 115-166,  240 >> 111-123 - 2h after dinner: 97 >> 70 >> n/c >> 92-114 - bedtime: n/c >> 140 >> n/c - nighttime: n/c Lowest sugar was 220 >>... 70 >> 144 >> 115 >> 92; he has hypoglycemia awareness in the 70s. Highest  sugar was 346 >>...  240 >> 171  Glucometer: Accu-Chek  Pt's meals are: - Breakfast: cereal (rice crispies) with milk, boiled eggs - Lunch: banana sandwich, whole wheat - Dinner: varies - Snacks: 2-3x Despite repeated advice about stopping orange juice and milk, he continues on these.  -+ Mild CKD, last BUN/creatinine:  Lab Results  Component Value Date   BUN 26 (H) 05/26/2020   BUN 24 09/25/2019   CREATININE 1.28 (H) 05/26/2020   CREATININE 1.05 09/25/2019  On valsartan 320.  -+ HL; last set of lipids: Lab Results  Component Value Date   CHOL 118 09/25/2019   HDL 38 (L) 09/25/2019   LDLCALC 65 09/25/2019   TRIG 74 09/25/2019   CHOLHDL 3.1 09/25/2019   - last eye exam was in 2021: No DR reportedly, + cataract.  Before last visit he had an episode of diplopia, resolved.  - no numbness and tingling in his feet.  Pt has FH of DM in PGF.  ROS: Constitutional: no weight gain/no weight loss, no fatigue, no subjective hyperthermia, no subjective hypothermia Eyes: no blurry vision, no xerophthalmia ENT: no sore throat, no nodules palpated in neck, no dysphagia, no odynophagia, no hoarseness Cardiovascular: no CP/no SOB/no palpitations/no leg swelling Respiratory: no cough/no SOB/no wheezing Gastrointestinal: no N/no V/no D/no C/no acid reflux Musculoskeletal: no muscle aches/no joint aches Skin:  no rashes, no hair loss Neurological: no tremors/no numbness/no tingling/no dizziness  I reviewed pt's medications, allergies, PMH, social hx, family hx, and changes were documented in the history of present illness. Otherwise, unchanged from my initial visit note.  Past Medical History:  Diagnosis Date  . Atrial fibrillation ([place])   . Cervical lymphadenopathy   . Erectile dysfunction   . GERD (gastroesophageal reflux disease)   . Hypertension   . Lymphoma, follicular ([place])    Dr.Shadad   Past Surgical History:  Procedure Laterality Date  . a bsc    . ABSCESS DRAINAGE      under rt eye   . DEEP NECK LYMPH NODE BIOPSY / EXCISION  2011  . VASECTOMY     Social History   Socioeconomic History  . Marital status: Married    Spouse name: Not on file  . Number of children: 2  Occupational History  .  Saw operator  Tobacco Use  . Smoking status: Never Smoker  . Smokeless tobacco: Never Used  Substance and Sexual Activity  . Alcohol use: No  . Drug use: No  . Sexual activity: Yes    Partners: Female   Current Outpatient Medications on File Prior to Visit  Medication Sig Dispense Refill  . Accu-Chek FastClix Lancets MISC Use to check fasting blood sugar and 2 hrs after largest meal 200 each 3  . ACCU-CHEK GUIDE test strip USE TO CHECK FASTING BLOOD SUGAR AND 2 HRS AFTER LARGEST MEAL 100 strip 1  . Cholecalciferol (VITAMIN D3) 125 MCG (5000 UT) CAPS Take 1 capsule (5,000 Units total) by mouth daily. 90 capsule 0  . ELIQUIS 5 MG TABS tablet TAKE 1 TABLET BY MOUTH TWICE A DAY 180 tablet 1  . FARXIGA 10 MG TABS tablet TAKE 1 TABLET BY MOUTH EVERY DAY 90 tablet 0  . glipiZIDE (GLUCOTROL) 5 MG tablet Take 1 tablet (5 mg total) by mouth daily before supper. (Patient not taking: Reported on 08/03/2020) 90 tablet 3  . metFORMIN (GLUCOPHAGE-XR) 500 MG 24 hr tablet Take 1 tablet (500 mg total) by mouth 2 (two) times daily after a meal. Hold until pt. needs it 180 tablet 3  . metoprolol tartrate (LOPRESSOR) 25 MG tablet TAKE 1 TABLET BY MOUTH TWICE A DAY 180 tablet 3  . OZEMPIC, 1 MG/DOSE, 4 MG/3ML SOPN INJECT 0.75 MLS (1 MG TOTAL) INTO THE SKIN ONCE A WEEK. 9 mL 1  . repaglinide (PRANDIN) 1 MG tablet Take 1 tablet (1 mg total) by mouth daily before supper. 30 tablet 11  . valsartan-hydrochlorothiazide (DIOVAN-HCT) 320-12.5 MG tablet **NEEDS APT FOR REFILLS**TAKE 1 TABLET BY MOUTH EVERY DAY 60 tablet 0   No current facility-administered medications on file prior to visit.   Allergies  Allergen Reactions  . Ampicillin Rash   Family History  Problem Relation Age of  Onset  . Hypertension Mother   . Kidney disease Mother   . Hypertension Father   . Hypertension Brother   . Diabetes Paternal Grandfather   . Hypertension Paternal Grandfather   . Kidney disease Maternal Aunt   . Kidney disease Maternal Grandmother    PE: BP 130/90 (BP Location: Right Arm, Patient Position: Sitting, Cuff Size: Normal)   Pulse 71   Ht 5\' 10"  (1.778 m)   Wt 219 lb 3.2 oz (99.4 kg)   SpO2 98%   BMI 31.45 kg/m  Wt Readings from Last 3 Encounters:  09/24/20 219 lb 3.2 oz (99.4 kg)  08/03/20 221 lb 6.4  oz (100.4 kg)  05/26/20 214 lb (97.1 kg)   Constitutional: overweight, in NAD Eyes: PERRLA, EOMI, no exophthalmos ENT: moist mucous membranes, no thyromegaly, no cervical lymphadenopathy Cardiovascular: RRR, No MRG Respiratory: CTA B Gastrointestinal: abdomen soft, NT, ND, BS+ Musculoskeletal: no deformities, strength intact in all 4 Skin: moist, warm, no rashes Neurological: no tremor with outstretched hands, DTR normal in all 4  ASSESSMENT: 1. DM2, non-insulin-dependent, now controlled, with long-term complications - mild CKD - ED  2. HL  3.  Obesity class I  PLAN:  1. Patient with longstanding, uncontrolled, type 2 diabetes, on oral antidiabetic regimen with metformin, meglitinide, SGLT2 inhibitor, and also weekly GLP-1 receptor agonist, with improved control after adding Ozempic (HbA1c 5.9%).  However, afterwards, sugars started to increase and at last visit HbA1c was 6.9%.  At that time, we discussed about quitting snacks after dinner, starting milk, and juice.  Since sugars are higher after dinner, I advised him to add a low-dose glipizide.  However, he had an episode of hypoglycemia afterwards and we changed to Prandin. -At this visit, sugars appear to have improved and she does not have any hypoglycemic episodes.  In the morning, they are still higher than target occasionally, but improved from before, while later in the day, sugars are mostly at goal.  I  did not suggest a change in regimen at today's visit. - I suggested to:  Patient Instructions  Please continue: - Metformin ER 1000 mg with dinner - Prandin 1 mg 30 min before dinner - Farxiga 10 mg before breakfast - Ozempic 1 mg weekly in am   Please return in 4 months with your sugar log.   - we checked his HbA1c: 6.1% (better) - advised to check sugars at different times of the day - 1x a day, rotating check times - advised for yearly eye exams >> he is UTD - return to clinic in 4 months  2. HL -Reviewed his lipid panel from 08/2019: Slightly low HDL, otherwise at goal: Lab Results  Component Value Date   CHOL 118 09/25/2019   HDL 38 (L) 09/25/2019   LDLCALC 65 09/25/2019   TRIG 74 09/25/2019   CHOLHDL 3.1 09/25/2019  -He is not on a statin -He is due for another lipid panel - pending with PCP  3.  Obesity class I -continue SGLT 2 inhibitor and GLP-1 receptor agonist which should also help with weight loss -He lost 9 pounds before our last visit but gained 5 pounds since then.  Philemon Kingdom, MD PhD [facility] Endocrinology

## 2020-10-21 ENCOUNTER — Other Ambulatory Visit: Payer: Self-pay | Admitting: Physician Assistant

## 2020-10-21 DIAGNOSIS — I152 Hypertension secondary to endocrine disorders: Secondary | ICD-10-CM

## 2020-10-25 ENCOUNTER — Other Ambulatory Visit: Payer: Self-pay | Admitting: Internal Medicine

## 2020-11-12 ENCOUNTER — Other Ambulatory Visit: Payer: Self-pay | Admitting: Family Medicine

## 2020-11-24 ENCOUNTER — Other Ambulatory Visit: Payer: Self-pay | Admitting: Internal Medicine

## 2020-12-01 ENCOUNTER — Other Ambulatory Visit: Payer: Self-pay

## 2020-12-01 ENCOUNTER — Other Ambulatory Visit: Payer: BC Managed Care – PPO

## 2020-12-01 DIAGNOSIS — Z1329 Encounter for screening for other suspected endocrine disorder: Secondary | ICD-10-CM

## 2020-12-01 DIAGNOSIS — I152 Hypertension secondary to endocrine disorders: Secondary | ICD-10-CM

## 2020-12-01 DIAGNOSIS — E785 Hyperlipidemia, unspecified: Secondary | ICD-10-CM

## 2020-12-01 DIAGNOSIS — R252 Cramp and spasm: Secondary | ICD-10-CM

## 2020-12-01 DIAGNOSIS — IMO0002 Reserved for concepts with insufficient information to code with codable children: Secondary | ICD-10-CM

## 2020-12-01 DIAGNOSIS — E1169 Type 2 diabetes mellitus with other specified complication: Secondary | ICD-10-CM

## 2020-12-02 LAB — COMPREHENSIVE METABOLIC PANEL
ALT: 17 IU/L (ref 0–44)
AST: 16 IU/L (ref 0–40)
Albumin/Globulin Ratio: 2.6 — ABNORMAL HIGH (ref 1.2–2.2)
Albumin: 4.6 g/dL (ref 3.8–4.8)
Alkaline Phosphatase: 78 IU/L (ref 44–121)
BUN/Creatinine Ratio: 18 (ref 10–24)
BUN: 22 mg/dL (ref 8–27)
Bilirubin Total: 0.5 mg/dL (ref 0.0–1.2)
CO2: 25 mmol/L (ref 20–29)
Calcium: 10 mg/dL (ref 8.6–10.2)
Chloride: 102 mmol/L (ref 96–106)
Creatinine, Ser: 1.19 mg/dL (ref 0.76–1.27)
Globulin, Total: 1.8 g/dL (ref 1.5–4.5)
Glucose: 111 mg/dL — ABNORMAL HIGH (ref 65–99)
Potassium: 3.9 mmol/L (ref 3.5–5.2)
Sodium: 142 mmol/L (ref 134–144)
Total Protein: 6.4 g/dL (ref 6.0–8.5)
eGFR: 67 mL/min/{1.73_m2} (ref >59)

## 2020-12-02 LAB — CBC
Hematocrit: 47.9 % (ref 37.5–51.0)
Hemoglobin: 16.6 g/dL (ref 13.0–17.7)
MCH: 31.4 pg (ref 26.6–33.0)
MCHC: 34.7 g/dL (ref 31.5–35.7)
MCV: 91 fL (ref 79–97)
Platelets: 213 10*3/uL (ref 150–450)
RBC: 5.29 x10E6/uL (ref 4.14–5.80)
RDW: 13.6 % (ref 11.6–15.4)
WBC: 8.1 10*3/uL (ref 3.4–10.8)

## 2020-12-02 LAB — MAGNESIUM: Magnesium: 2.3 mg/dL (ref 1.6–2.3)

## 2020-12-02 LAB — LIPID PANEL
Chol/HDL Ratio: 3.4 ratio (ref 0.0–5.0)
Cholesterol, Total: 124 mg/dL (ref 100–199)
HDL: 37 mg/dL — ABNORMAL LOW (ref >39)
LDL Chol Calc (NIH): 67 mg/dL (ref 0–99)
Triglycerides: 106 mg/dL (ref 0–149)
VLDL Cholesterol Cal: 20 mg/dL (ref 5–40)

## 2020-12-02 LAB — HEMOGLOBIN A1C
Est. average glucose Bld gHb Est-mCnc: 143 mg/dL
Hgb A1c MFr Bld: 6.6 % — ABNORMAL HIGH (ref 4.8–5.6)

## 2020-12-02 LAB — TSH: TSH: 3.47 u[IU]/mL (ref 0.450–4.500)

## 2020-12-03 ENCOUNTER — Other Ambulatory Visit: Payer: Self-pay | Admitting: Internal Medicine

## 2020-12-03 ENCOUNTER — Ambulatory Visit: Payer: BC Managed Care – PPO | Admitting: Physician Assistant

## 2020-12-04 ENCOUNTER — Telehealth: Payer: Self-pay | Admitting: Internal Medicine

## 2020-12-04 ENCOUNTER — Other Ambulatory Visit: Payer: Self-pay | Admitting: Internal Medicine

## 2020-12-04 NOTE — Telephone Encounter (Signed)
*  STAT* If patient is at the pharmacy, call can be transferred to refill team.   1. Which medications need to be refilled? (please list name of each medication and dose if known) metoprolol tartrate (LOPRESSOR) 25 MG tablet  2. Which pharmacy/location (including street and city if local pharmacy) is medication to be sent to? [id number] [place], [place].  3. Do they need a 30 day or 90 day supply? 30ds

## 2020-12-25 ENCOUNTER — Other Ambulatory Visit: Payer: Self-pay | Admitting: Internal Medicine

## 2020-12-25 DIAGNOSIS — I480 Paroxysmal atrial fibrillation: Secondary | ICD-10-CM

## 2020-12-25 NOTE — Telephone Encounter (Signed)
Prescription refill request for Eliquis received. Indication: Afib Last office visit: 12/25/19 [name]) Scr: 1.19 (12/01/20) Age: 70 Weight: 99.4kg  Pt overdue for Dr appt. Message sent to schedulers.

## 2020-12-30 ENCOUNTER — Other Ambulatory Visit: Payer: Self-pay | Admitting: Internal Medicine

## 2021-01-06 NOTE — Telephone Encounter (Signed)
Pt last saw Dr [name] 12/25/19, pt is overdue for follow-up.  Last labs 12/01/20 Creat 1.19, age 70, weight 99.4kg, based on specified criteria pt is on appropriate dosage of Eliquis '5mg'$  BID.  Will send message to schedulers again pt needs OV for refills.  Called spoke with pt's wife, she states pt sent a my chart message with dates he could come in for an appt to Dr Tanna Furry office and is awaiting a call back to be scheduled.  Pt is out of Eliquis at present will send in a 30 day supply

## 2021-01-06 NOTE — Telephone Encounter (Signed)
*  STAT* If patient is at the pharmacy, call can be transferred to refill team.   1. Which medications need to be refilled? (please list name of each medication and dose if known) ELIQUIS 5 MG TABS tablet  2. Which pharmacy/location (including street and city if local pharmacy) is medication to be sent to? [id number] [place], [place] - [address]  3. Do they need a 30 day or 90 day supply? 90 day supply    Pt is out of medication

## 2021-01-06 NOTE — Telephone Encounter (Signed)
30 day supply sent into pharmacy. Will notify schedulers to contact pt again for appt.

## 2021-01-17 ENCOUNTER — Other Ambulatory Visit: Payer: Self-pay | Admitting: Physician Assistant

## 2021-01-17 DIAGNOSIS — E1159 Type 2 diabetes mellitus with other circulatory complications: Secondary | ICD-10-CM

## 2021-01-17 DIAGNOSIS — I152 Hypertension secondary to endocrine disorders: Secondary | ICD-10-CM

## 2021-01-25 ENCOUNTER — Other Ambulatory Visit: Payer: Self-pay

## 2021-01-25 ENCOUNTER — Encounter: Payer: Self-pay | Admitting: Internal Medicine

## 2021-01-25 ENCOUNTER — Ambulatory Visit: Payer: BC Managed Care – PPO | Admitting: Internal Medicine

## 2021-01-25 VITALS — BP 110/72 | HR 84 | Ht 70.0 in | Wt 221.4 lb

## 2021-01-25 DIAGNOSIS — E785 Hyperlipidemia, unspecified: Secondary | ICD-10-CM | POA: Diagnosis not present

## 2021-01-25 DIAGNOSIS — E1122 Type 2 diabetes mellitus with diabetic chronic kidney disease: Secondary | ICD-10-CM | POA: Diagnosis not present

## 2021-01-25 DIAGNOSIS — E669 Obesity, unspecified: Secondary | ICD-10-CM | POA: Diagnosis not present

## 2021-01-25 NOTE — Progress Notes (Signed)
Patient ID: Tom Fuller, male   DOB: 01-09-1951, 70 y.o.   MRN: [id number]   This visit occurred during the SARS-CoV-2 public health emergency.  Safety protocols were in place, including screening questions prior to the visit, additional usage of staff PPE, and extensive cleaning of exam room while observing appropriate contact time as indicated for disinfecting solutions.   HPI: Tom Fuller is a 70 y.o.-year-old male, presenting for follow-up for DM2, dx in 2017, non-insulin-dependent, uncontrolled, with complications (mild CKD, ED).  Last visit 4 months ago.  Interim history: No increased urination, blurry vision, nausea, diarrhea, constipation, chest pain. He has mild Left sided upper AP after sweets.  Reviewed HbA1c levels: Lab Results  Component Value Date   HGBA1C 6.6 (H) 12/01/2020   HGBA1C 6.1 (A) 09/24/2020   HGBA1C 6.9 (A) 05/26/2020   HGBA1C 6.9 (H) 09/25/2019   HGBA1C 5.9 (A) 08/16/2019   HGBA1C 8.0 (A) 02/08/2019   HGBA1C 8.5 (A) 07/06/2018   HGBA1C 12.1 (H) 01/12/2018   HGBA1C 10.7 (H) 09/19/2017   HGBA1C 8.1 (H) 03/21/2016   Pt is on a regimen of: - Metformin ER -started 06/2018 >> 500 mg 2x a day with meals >> 1000 mg with dinner - Farxiga 10 mg before b'fast - Glipizide 5 mg before dinner >> Prandin 1 mg before dinner - Ozempic 0.5 mg weekly in am  - started 02/2018 >> mild constipation >> 1 mg weekly-increased 11/2019 He was on Glipizide 5 mg 2x a day before meals -started 01/2019 >> stopped after adding Ozempic He stopped regular metformin due to abdominal pain  Pt checks his sugars once a day: - am: 130-179, 190 >> 105-145, 171 >> 105-144, 146, 162 - 2h after b'fast: n/c - before lunch: n/c >> 119 >> 123 - 2h after lunch: n/c >> 141 >> n/c - before dinner: 115-166,  240 >> 111-123 >> 114-129, 134 - 2h after dinner: 97 >> 70 >> n/c >> 92-114 >> 88, 125 - bedtime: n/c >> 140 >> n/c - nighttime: n/c Lowest sugar was 220 >>... 70 >> 144 >> 115 >> 92; he has  hypoglycemia awareness in the 70s. Highest sugar was 346 >>...  240 >> 171  Glucometer: Accu-Chek  Pt's meals are: - Breakfast: cereal (rice crispies) with milk, boiled eggs - Lunch: banana sandwich, whole wheat - Dinner: varies - Snacks: 2-3x Despite repeated advice about stopping orange juice (with b'fast - 12 oz) and milk (with dinner - 12 oz), he continues on these, but decreased the amount since last OV.  -+ Mild CKD, last BUN/creatinine:  Lab Results  Component Value Date   BUN 22 12/01/2020   BUN 26 (H) 05/26/2020   CREATININE 1.19 12/01/2020   CREATININE 1.28 (H) 05/26/2020  On valsartan 320.  -+ HL; last set of lipids: Lab Results  Component Value Date   CHOL 124 12/01/2020   HDL 37 (L) 12/01/2020   LDLCALC 67 12/01/2020   TRIG 106 12/01/2020   CHOLHDL 3.4 12/01/2020  He is not on a statin.  - last eye exam was in 2021: No DR reportedly, + cataract.  Before last visit he had an episode of diplopia, resolved.  - no numbness and tingling in his feet.  Pt has FH of DM in PGF.  ROS: Constitutional: no weight gain/no weight loss, no fatigue, no subjective hyperthermia, no subjective hypothermia Eyes: no blurry vision, no xerophthalmia ENT: no sore throat, no nodules palpated in neck, no dysphagia, no odynophagia, no hoarseness  Cardiovascular: no CP/no SOB/no palpitations/no leg swelling Respiratory: no cough/no SOB/no wheezing Gastrointestinal: no N/no V/no D/no C/no acid reflux Musculoskeletal: no muscle aches/no joint aches Skin: no rashes, no hair loss Neurological: no tremors/no numbness/no tingling/no dizziness  I reviewed pt's medications, allergies, PMH, social hx, family hx, and changes were documented in the history of present illness. Otherwise, unchanged from my initial visit note.  Past Medical History:  Diagnosis Date   Atrial fibrillation ([place])    Cervical lymphadenopathy    Erectile dysfunction    GERD (gastroesophageal reflux disease)     Hypertension    Lymphoma, follicular (HCC)    Dr.Shadad   Past Surgical History:  Procedure Laterality Date   a bsc     ABSCESS DRAINAGE     under rt eye    DEEP NECK LYMPH NODE BIOPSY / EXCISION  2011   VASECTOMY     Social History   Socioeconomic History   Marital status: Married    Spouse name: Not on file   Number of children: 2  Occupational History    [employer] Mining engineer  Tobacco Use   Smoking status: Never Smoker   Smokeless tobacco: Never Used  Substance and Sexual Activity   Alcohol use: No   Drug use: No   Sexual activity: Yes    Partners: Female   Current Outpatient Medications on File Prior to Visit  Medication Sig Dispense Refill   Accu-Chek FastClix Lancets MISC USE AS DIRECTED 102 each 1   ACCU-CHEK GUIDE test strip USE TO CHECK FASTING BLOOD SUGAR AND 2 HRS AFTER LARGEST MEAL 100 strip 1   apixaban (ELIQUIS) 5 MG TABS tablet Take 1 tablet (5 mg total) by mouth 2 (two) times daily. Must see MD for future refills. Please call office to schedule appointment. 60 tablet 0   Cholecalciferol (VITAMIN D3) 125 MCG (5000 UT) CAPS Take 1 capsule (5,000 Units total) by mouth daily. 90 capsule 0   dapagliflozin propanediol (FARXIGA) 10 MG TABS tablet Take 1 tablet (10 mg total) by mouth daily. 90 tablet 3   metFORMIN (GLUCOPHAGE-XR) 500 MG 24 hr tablet Take 2 tablets (1,000 mg total) by mouth daily with supper. 180 tablet 3   metoprolol tartrate (LOPRESSOR) 25 MG tablet Take 1 tablet (25 mg total) by mouth 2 (two) times daily. Please schedule appt for future refills. 1st attempt 180 tablet 0   OZEMPIC, 1 MG/DOSE, 4 MG/3ML SOPN INJECT 0.75 MLS (1 MG TOTAL) INTO THE SKIN ONCE A WEEK. 3 mL 5   repaglinide (PRANDIN) 1 MG tablet Take 1 tablet (1 mg total) by mouth daily before supper. 90 tablet 3   valsartan-hydrochlorothiazide (DIOVAN-HCT) 320-12.5 MG tablet TAKE 1 TABLET BY MOUTH EVERY DAY 90 tablet 0   No current facility-administered medications on file prior to visit.    Allergies  Allergen Reactions   Ampicillin Rash   Family History  Problem Relation Age of Onset   Hypertension Mother    Kidney disease Mother    Hypertension Father    Hypertension Brother    Diabetes Paternal Grandfather    Hypertension Paternal Grandfather    Kidney disease Maternal Aunt    Kidney disease Maternal Grandmother    PE: BP 110/72 (BP Location: Right Arm, Patient Position: Sitting, Cuff Size: Normal)   Pulse 84   Ht '5\' 10"'$  (1.778 m)   Wt 221 lb 6.4 oz (100.4 kg)   SpO2 98%   BMI 31.77 kg/m  Wt Readings from Last 3 Encounters:  01/25/21 221 lb 6.4 oz (100.4 kg)  09/24/20 219 lb 3.2 oz (99.4 kg)  08/03/20 221 lb 6.4 oz (100.4 kg)   Constitutional: overweight, in NAD Eyes: PERRLA, EOMI, no exophthalmos ENT: moist mucous membranes, no thyromegaly, no cervical lymphadenopathy Cardiovascular: RRR, No MRG Respiratory: CTA B Gastrointestinal: abdomen soft, NT, ND, BS+ Musculoskeletal: no deformities, strength intact in all 4 Skin: moist, warm, no rashes Neurological: no tremor with outstretched hands, DTR normal in all 4  ASSESSMENT: 1. DM2, non-insulin-dependent, now controlled, with long-term complications - mild CKD - ED  2. HL  3.  Obesity class I  PLAN:  1. Patient with longstanding, uncontrolled, type 2 diabetes, on oral antidiabetic regimen with metformin, meglitinide, SGLT2 inhibitor, and also weekly GLP-1 receptor agonist, with improved control after adding Ozempic (HbA1c decreased down to 5.9%).  However, afterwards, sugars started to increase and we added glipizide.  He had an episode of hypoglycemia afterwards, so we changed to Prandin.  He did not have any more hypoglycemic episodes afterwards.  At last visit, we did not change his regimen.  I did suggest that he eliminated orange juice and milk.  HbA1c at that time 6.1%, excellent.  However, since then, HbA1c increased to 6.6% last month. -At today's visit, sugars are mostly at goal, improved  in the last 3 weeks, especially in the morning, but he still has blood sugars above target around this time.  As of now, no medication changes needed, however, I did advise him that if he is able to eliminate milk with dinner, he could probably eliminate Prandin, also.  He will think about it.  - I suggested to:  Patient Instructions  Please continue: - Metformin ER 1000 mg with dinner - Prandin 1 mg 30 min before dinner - Farxiga 10 mg before breakfast - Ozempic 1 mg weekly in am   Please return in 4 months with your sugar log.   - advised to check sugars at different times of the day - 1x a day, rotating check times - advised for yearly eye exams >> he is UTD but is due for another eye exam - return to clinic in 4 months  2. HL -Reviewed latest lipid panel from 11/2020: Fractions at goal with the exception of a slightly low HDL: Lab Results  Component Value Date   CHOL 124 12/01/2020   HDL 37 (L) 12/01/2020   LDLCALC 67 12/01/2020   TRIG 106 12/01/2020   CHOLHDL 3.4 12/01/2020  -He is not on a statin  3.  Obesity class I -continue SGLT 2 inhibitor and GLP-1 receptor agonist which should also help with weight loss -He gained 5 pounds before last visit, but lost 9 pounds previously -He gained 2 pounds since last visit  Philemon Kingdom, MD PhD [facility] Endocrinology

## 2021-01-25 NOTE — Patient Instructions (Addendum)
Please continue: - Metformin ER 1000 mg with dinner - Prandin 1 mg 30 min before dinner - Farxiga 10 mg before breakfast - Ozempic 1 mg weekly in am   Try again to stop milk and OJ.  Please return in 4 months with your sugar log.

## 2021-01-26 ENCOUNTER — Other Ambulatory Visit: Payer: BC Managed Care – PPO

## 2021-01-26 ENCOUNTER — Inpatient Hospital Stay: Payer: BC Managed Care – PPO | Attending: Oncology | Admitting: Oncology

## 2021-01-26 ENCOUNTER — Inpatient Hospital Stay: Payer: BC Managed Care – PPO

## 2021-01-26 VITALS — BP 116/75 | HR 96 | Temp 99.0°F | Resp 18 | Ht 70.0 in | Wt 220.8 lb

## 2021-01-26 DIAGNOSIS — Z8572 Personal history of non-Hodgkin lymphomas: Secondary | ICD-10-CM | POA: Insufficient documentation

## 2021-01-26 DIAGNOSIS — Z7901 Long term (current) use of anticoagulants: Secondary | ICD-10-CM | POA: Diagnosis not present

## 2021-01-26 DIAGNOSIS — C8280 Other types of follicular lymphoma, unspecified site: Secondary | ICD-10-CM

## 2021-01-26 DIAGNOSIS — Z7984 Long term (current) use of oral hypoglycemic drugs: Secondary | ICD-10-CM | POA: Insufficient documentation

## 2021-01-26 DIAGNOSIS — I4891 Unspecified atrial fibrillation: Secondary | ICD-10-CM | POA: Insufficient documentation

## 2021-01-26 DIAGNOSIS — Z79899 Other long term (current) drug therapy: Secondary | ICD-10-CM | POA: Diagnosis not present

## 2021-01-26 LAB — CMP (CANCER CENTER ONLY)
ALT: 15 U/L (ref 0–44)
AST: 15 U/L (ref 15–41)
Albumin: 4.2 g/dL (ref 3.5–5.0)
Alkaline Phosphatase: 64 U/L (ref 38–126)
Anion gap: 8 (ref 5–15)
BUN: 25 mg/dL — ABNORMAL HIGH (ref 8–23)
CO2: 27 mmol/L (ref 22–32)
Calcium: 10.1 mg/dL (ref 8.9–10.3)
Chloride: 103 mmol/L (ref 98–111)
Creatinine: 1.28 mg/dL — ABNORMAL HIGH (ref 0.61–1.24)
GFR, Estimated: >60 mL/min (ref >60)
Glucose, Bld: 99 mg/dL (ref 70–99)
Potassium: 4 mmol/L (ref 3.5–5.1)
Sodium: 138 mmol/L (ref 135–145)
Total Bilirubin: 0.9 mg/dL (ref 0.3–1.2)
Total Protein: 6.9 g/dL (ref 6.5–8.1)

## 2021-01-26 LAB — CBC WITH DIFFERENTIAL (CANCER CENTER ONLY)
Abs Immature Granulocytes: 0.01 10*3/uL (ref 0.00–0.07)
Basophils Absolute: 0.1 10*3/uL (ref 0.0–0.1)
Basophils Relative: 1 %
Eosinophils Absolute: 0.4 10*3/uL (ref 0.0–0.5)
Eosinophils Relative: 4 %
HCT: 41.6 % (ref 39.0–52.0)
Hemoglobin: 15 g/dL (ref 13.0–17.0)
Immature Granulocytes: 0 %
Lymphocytes Relative: 35 %
Lymphs Abs: 3 10*3/uL (ref 0.7–4.0)
MCH: 32.2 pg (ref 26.0–34.0)
MCHC: 36.1 g/dL — ABNORMAL HIGH (ref 30.0–36.0)
MCV: 89.3 fL (ref 80.0–100.0)
Monocytes Absolute: 0.8 10*3/uL (ref 0.1–1.0)
Monocytes Relative: 9 %
Neutro Abs: 4.4 10*3/uL (ref 1.7–7.7)
Neutrophils Relative %: 51 %
Platelet Count: 216 10*3/uL (ref 150–400)
RBC: 4.66 MIL/uL (ref 4.22–5.81)
RDW: 13.5 % (ref 11.5–15.5)
WBC Count: 8.6 10*3/uL (ref 4.0–10.5)
nRBC: 0 % (ref 0.0–0.2)

## 2021-01-26 NOTE — Progress Notes (Signed)
Hematology and Oncology Follow Up Visit  Tom Fuller [id number] 1951/03/24 70 y.o. 01/26/2021 12:57 PM    CC: Tom Fuller. Tom Le, MD  Tom Fuller, [name]  Tom Chessman, [name]: 70 year old man with follicular lymphoma diagnosed in May 2010.  He presented with stage IIIA disease at that time.    Past Therapy:  He had a period of observation to be 2010 and 2013.  He is S/P Bendamustine and rituximab started on 08/25/11 for 6 cycles completed in 12/2011 and achieved complete response at this time.  Current therapy: Active surveillance.  Interim History:  Mr. Kulzer returns today for a follow-up evaluation.  Since the last visit, he reports no major changes in his health.  He denies any recent hospitalization or illnesses.  He denies any lymphadenopathy, constitutional symptoms or fatigue.  He denies any weight loss or appetite changes.  His energy and performance status remains excellent.    Medications: Updated on review.  Current Outpatient Medications  Medication Sig Dispense Refill   Accu-Chek FastClix Lancets MISC USE AS DIRECTED 102 each 1   ACCU-CHEK GUIDE test strip USE TO CHECK FASTING BLOOD SUGAR AND 2 HRS AFTER LARGEST MEAL 100 strip 1   apixaban (ELIQUIS) 5 MG TABS tablet Take 1 tablet (5 mg total) by mouth 2 (two) times daily. Must see MD for future refills. Please call office to schedule appointment. 60 tablet 0   Cholecalciferol (VITAMIN D3) 125 MCG (5000 UT) CAPS Take 1 capsule (5,000 Units total) by mouth daily. 90 capsule 0   dapagliflozin propanediol (FARXIGA) 10 MG TABS tablet Take 1 tablet (10 mg total) by mouth daily. 90 tablet 3   metFORMIN (GLUCOPHAGE-XR) 500 MG 24 hr tablet Take 2 tablets (1,000 mg total) by mouth daily with supper. 180 tablet 3   metoprolol tartrate (LOPRESSOR) 25 MG tablet Take 1 tablet (25 mg total) by mouth 2 (two) times daily. Please schedule appt for future refills. 1st attempt 180 tablet 0   OZEMPIC, 1 MG/DOSE, 4  MG/3ML SOPN INJECT 0.75 MLS (1 MG TOTAL) INTO THE SKIN ONCE A WEEK. 3 mL 5   repaglinide (PRANDIN) 1 MG tablet Take 1 tablet (1 mg total) by mouth daily before supper. 90 tablet 3   valsartan-hydrochlorothiazide (DIOVAN-HCT) 320-12.5 MG tablet TAKE 1 TABLET BY MOUTH EVERY DAY 90 tablet 0   No current facility-administered medications for this visit.    Allergies:  Allergies  Allergen Reactions   Ampicillin Rash        Physical Exam:  Blood pressure 116/75, pulse 96, temperature 99 F (37.2 C), temperature source Oral, resp. rate 18, height '5\' 10"'$  (1.778 m), weight 220 lb 12.8 oz (100.2 kg), SpO2 98 %.    ECOG: 0    General appearance: Alert, awake without any distress. Head: Atraumatic without abnormalities Oropharynx: Without any thrush or ulcers. Eyes: No scleral icterus. Lymph nodes: No lymphadenopathy noted in the cervical, supraclavicular, or axillary nodes Heart:regular rate and rhythm, without any murmurs or gallops.   Lung: Clear to auscultation without any rhonchi, wheezes or dullness to percussion. Abdomin: Soft, nontender without any shifting dullness or ascites. Musculoskeletal: No clubbing or cyanosis. Neurological: No motor or sensory deficits. Skin: No rashes or lesions.      Lab Results: Lab Results  Component Value Date   WBC 8.1 12/01/2020   HGB 16.6 12/01/2020   HCT 47.9 12/01/2020   MCV 91 12/01/2020   PLT 213 12/01/2020  Impression and Plan:  70 year old man with:   1.  Stage IIIA follicular lymphoma diagnosed in 2010.    His disease status was updated at this time and treatment options were reviewed.  He continues to be asymptomatic without any evidence of disease progression.  Indication for treatment were reviewed today including painful adenopathy, constitutional symptoms or bone marrow disease.  At this time he has no signs or symptoms to suggest need for treatment.  Treatment options including systemic chemotherapy,  oral targeted therapy among others.  Laboratory data from today reviewed and showed normal hematological parameters with normal white cell count, hemoglobin and platelets.   2. Atrial fibrillation: Continues to be in normal sinus rhythm without any exacerbation.   3.  Follow-up: In 8 months for repeat follow-up.  30  minutes were dedicated to this visit.  The time spent on reviewing laboratory data, disease status update, treatment choices discussion and future plan of care review.     Zola Button 8/30/202212:57 PM

## 2021-02-02 ENCOUNTER — Other Ambulatory Visit: Payer: Self-pay | Admitting: Internal Medicine

## 2021-02-02 DIAGNOSIS — I480 Paroxysmal atrial fibrillation: Secondary | ICD-10-CM

## 2021-02-02 NOTE — Telephone Encounter (Signed)
Prescription refill request for Eliquis received. Indication:Afib  Last office visit:12/25/19 (Pt has scheduled office vitis with Dr [name] on 03/30/21) Scr: 1.19 (12/01/20) Age: 70 Weight: 100.2kg  Appropriate dose and refill sent to requested pharmacy.

## 2021-02-15 ENCOUNTER — Telehealth: Payer: Self-pay | Admitting: Pharmacy Technician

## 2021-02-15 NOTE — Telephone Encounter (Addendum)
Patient Advocate Encounter   Received notification from [facility] that prior authorization for [facility] is required.   PA submitted on 02/15/2021 Key [id number] Status is approved from 02/15/2021 to 02/15/2024    [facility] will continue to follow   Ronney Asters, [place] Patient Advocate [facility] Phone: [phone] Fax:  [phone]

## 2021-02-19 ENCOUNTER — Other Ambulatory Visit: Payer: Self-pay | Admitting: Physician Assistant

## 2021-03-30 ENCOUNTER — Other Ambulatory Visit: Payer: Self-pay

## 2021-03-30 ENCOUNTER — Ambulatory Visit: Payer: BC Managed Care – PPO | Admitting: Internal Medicine

## 2021-03-30 VITALS — BP 126/70 | HR 92 | Ht 70.0 in | Wt 220.5 lb

## 2021-03-30 DIAGNOSIS — I5032 Chronic diastolic (congestive) heart failure: Secondary | ICD-10-CM

## 2021-03-30 DIAGNOSIS — I480 Paroxysmal atrial fibrillation: Secondary | ICD-10-CM

## 2021-03-30 DIAGNOSIS — I10 Essential (primary) hypertension: Secondary | ICD-10-CM | POA: Diagnosis not present

## 2021-03-30 NOTE — Patient Instructions (Signed)

## 2021-03-30 NOTE — Progress Notes (Signed)
HPI Mr. Druckenmiller returns today for followup. He is a pleasant 70 yo man with a h/o HTN, PAF, lymphoma, who has done well. In the interim he denies chest pain or sob. He does not have palpitations. He has not had a covid vaccine. He has gotten Covid twice.  Allergies  Allergen Reactions   Ampicillin Rash     Current Outpatient Medications  Medication Sig Dispense Refill   Accu-Chek FastClix Lancets MISC USE AS DIRECTED 102 each 1   ACCU-CHEK GUIDE test strip USE TO CHECK FASTING BLOOD SUGAR AND 2 HRS AFTER LARGEST MEAL 100 strip 1   apixaban (ELIQUIS) 5 MG TABS tablet TAKE 1 TABLET (5 MG TOTAL) BY MOUTH 2 (TWO) TIMES DAILY. MUST SEE MD FOR FUTURE REFILLS. 60 tablet 3   Cholecalciferol (VITAMIN D3) 125 MCG (5000 UT) CAPS Take 1 capsule (5,000 Units total) by mouth daily. 90 capsule 0   dapagliflozin propanediol (FARXIGA) 10 MG TABS tablet Take 1 tablet (10 mg total) by mouth daily. 90 tablet 3   metFORMIN (GLUCOPHAGE-XR) 500 MG 24 hr tablet Take 2 tablets (1,000 mg total) by mouth daily with supper. 180 tablet 3   metoprolol tartrate (LOPRESSOR) 25 MG tablet Take 1 tablet (25 mg total) by mouth 2 (two) times daily. Please schedule appt for future refills. 1st attempt 180 tablet 0   OZEMPIC, 1 MG/DOSE, 4 MG/3ML SOPN INJECT 0.75 MLS (1 MG TOTAL) INTO THE SKIN ONCE A WEEK. 3 mL 5   repaglinide (PRANDIN) 1 MG tablet Take 1 tablet (1 mg total) by mouth daily before supper. 90 tablet 3   valsartan-hydrochlorothiazide (DIOVAN-HCT) 320-12.5 MG tablet TAKE 1 TABLET BY MOUTH EVERY DAY 90 tablet 0   No current facility-administered medications for this visit.     Past Medical History:  Diagnosis Date   Atrial fibrillation (HCC)    Cervical lymphadenopathy    Erectile dysfunction    GERD (gastroesophageal reflux disease)    Hypertension    Lymphoma, follicular (HCC)    Dr.Shadad    ROS:   All systems reviewed and negative except as noted in the HPI.   Past Surgical History:   Procedure Laterality Date   a bsc     ABSCESS DRAINAGE     under rt eye    DEEP NECK LYMPH NODE BIOPSY / EXCISION  2011   VASECTOMY       Family History  Problem Relation Age of Onset   Hypertension Mother    Kidney disease Mother    Hypertension Father    Hypertension Brother    Diabetes Paternal Grandfather    Hypertension Paternal Grandfather    Kidney disease Maternal Aunt    Kidney disease Maternal Grandmother      Social History   Socioeconomic History   Marital status: Married    Spouse name: Not on file   Number of children: Not on file   Years of education: Not on file   Highest education level: Not on file  Occupational History   Not on file  Tobacco Use   Smoking status: Never   Smokeless tobacco: Never  Substance and Sexual Activity   Alcohol use: No   Drug use: No   Sexual activity: Yes    Partners: Female  Other Topics Concern   Not on file  Social History Narrative   Not on file   Social Determinants of Health   Financial Resource Strain: Not on file  Food Insecurity: Not on  file  Transportation Needs: Not on file  Physical Activity: Not on file  Stress: Not on file  Social Connections: Not on file  Intimate Partner Violence: Not on file     BP 126/70   Pulse 92   Ht 5\' 10"  (1.778 m)   Wt 220 lb 8 oz (100 kg)   SpO2 98%   BMI 31.64 kg/m   Physical Exam:  Well appearing NAD HEENT: Unremarkable Neck:  No JVD, no thyromegally Lymphatics:  No adenopathy Back:  No CVA tenderness Lungs:  Clear with no wheezes HEART:  IRegular rate rhythm, no murmurs, no rubs, no clicks Abd:  soft, positive bowel sounds, no organomegally, no rebound, no guarding Ext:  2 plus pulses, no edema, no cyanosis, no clubbing Skin:  No rashes no nodules Neuro:  CN II through XII intact, motor grossly intact  EKG - atrial fib with a RVR   Assess/Plan:  1.  Persistent atrial fib - he is asymptomatic and his VR is well controlled.  2. HTN - his  pressures have been normal. He will continue his current meds. 3. Chronic diastolic heart failure - I encouraged the patient to avoid salty foods. He will maintain a low sodium diet. 4. obesity - he is encouraged to lose weight. I would like him under 200 lbs.   Carleene Overlie Dantonio Justen,MD

## 2021-03-31 ENCOUNTER — Other Ambulatory Visit: Payer: Self-pay | Admitting: Internal Medicine

## 2021-04-04 ENCOUNTER — Other Ambulatory Visit: Payer: Self-pay | Admitting: Internal Medicine

## 2021-04-19 ENCOUNTER — Other Ambulatory Visit: Payer: Self-pay | Admitting: Physician Assistant

## 2021-04-19 DIAGNOSIS — E1159 Type 2 diabetes mellitus with other circulatory complications: Secondary | ICD-10-CM

## 2021-05-03 ENCOUNTER — Other Ambulatory Visit: Payer: Self-pay

## 2021-05-03 DIAGNOSIS — I152 Hypertension secondary to endocrine disorders: Secondary | ICD-10-CM

## 2021-05-03 MED ORDER — VALSARTAN-HYDROCHLOROTHIAZIDE 320-12.5 MG PO TABS: 1.0000 | ORAL_TABLET | Freq: Every day | ORAL | 0 refills | Status: DC

## 2021-05-27 ENCOUNTER — Encounter: Payer: Self-pay | Admitting: Internal Medicine

## 2021-05-28 ENCOUNTER — Other Ambulatory Visit: Payer: Self-pay

## 2021-05-28 ENCOUNTER — Ambulatory Visit (INDEPENDENT_AMBULATORY_CARE_PROVIDER_SITE_OTHER): Payer: BC Managed Care – PPO | Admitting: Internal Medicine

## 2021-05-28 ENCOUNTER — Encounter: Payer: Self-pay | Admitting: Internal Medicine

## 2021-05-28 ENCOUNTER — Ambulatory Visit: Payer: BC Managed Care – PPO | Admitting: Internal Medicine

## 2021-05-28 VITALS — BP 122/82 | HR 80 | Ht 70.0 in | Wt 217.2 lb

## 2021-05-28 DIAGNOSIS — E785 Hyperlipidemia, unspecified: Secondary | ICD-10-CM

## 2021-05-28 DIAGNOSIS — E1122 Type 2 diabetes mellitus with diabetic chronic kidney disease: Secondary | ICD-10-CM | POA: Diagnosis not present

## 2021-05-28 DIAGNOSIS — E669 Obesity, unspecified: Secondary | ICD-10-CM | POA: Diagnosis not present

## 2021-05-28 LAB — POCT GLYCOSYLATED HEMOGLOBIN (HGB A1C): Hemoglobin A1C: 6.3 % — AB (ref 4.0–5.6)

## 2021-05-28 MED ORDER — OZEMPIC (1 MG/DOSE) 4 MG/3ML ~~LOC~~ SOPN: PEN_INJECTOR | SUBCUTANEOUS | 3 refills | Status: DC

## 2021-05-28 MED ORDER — PRAVASTATIN SODIUM 20 MG PO TABS: 20.0000 mg | ORAL_TABLET | Freq: Every day | ORAL | 3 refills | Status: DC

## 2021-05-28 NOTE — Patient Instructions (Addendum)
Please continue: - Metformin ER 1000 mg with dinner - Prandin 1 mg 30 min before dinner - Farxiga 10 mg before breakfast - Ozempic 1 mg weekly in am   Please start: - Pravastatin 20 mg daily   Please return in 4 months with your sugar log.

## 2021-05-28 NOTE — Progress Notes (Signed)
Patient ID: Tom Fuller, male   DOB: [birth year], 70 y.o.   MRN: [id number]   This visit occurred during the SARS-CoV-2 public health emergency.  Safety protocols were in place, including screening questions prior to the visit, additional usage of staff PPE, and extensive cleaning of exam room while observing appropriate contact time as indicated for disinfecting solutions.   HPI: Tom Fuller is a 70 y.o.-year-old male, presenting for follow-up for DM2, dx in 2017, non-insulin-dependent, uncontrolled, with complications (mild CKD, ED).  Last visit 4 months ago.  Interim history: No increased urination, blurry vision, nausea, chest pain. Continues to have occasional discomfort/pressure in the left upper quadrant after eating sweets.  Reviewed HbA1c levels: Lab Results  Component Value Date   HGBA1C 6.6 (H) 12/01/2020   HGBA1C 6.1 (A) 09/24/2020   HGBA1C 6.9 (A) 05/26/2020   HGBA1C 6.9 (H) 09/25/2019   HGBA1C 5.9 (A) 08/16/2019   HGBA1C 8.0 (A) 02/08/2019   HGBA1C 8.5 (A) 07/06/2018   HGBA1C 12.1 (H) 01/12/2018   HGBA1C 10.7 (H) 09/19/2017   HGBA1C 8.1 (H) 03/21/2016   Pt is on a regimen of: - Metformin ER -started 06/2018 >> 500 mg 2x a day with meals >> 1000 mg with dinner - Farxiga 10 mg before b'fast -  Prandin 1 mg before dinner - Ozempic 0.5 mg weekly in am  - started 02/2018 >> mild constipation >> 1 mg weekly-increased 11/2019 He was on Glipizide 5 mg 2x a day before meals -started 01/2019 >> stopped after adding Ozempic He stopped regular metformin due to abdominal pain  Pt checks his sugars once a day: - am: 105-145, 171 >> 105-144, 146, 162 >> 92-139, 146, 150 - 2h after b'fast: n/c - before lunch: n/c >> 119 >> 123 >> n/c - 2h after lunch: n/c >> 141 >> n/c >> 106-119, 144 - before dinner: 111-123 >> 114-129, 134 >> 65, 105-122, 157 - 2h after dinner: 70 >> n/c >> 92-114 >> 88, 125 >> 93-124 - bedtime: n/c >> 140 >> n/c - nighttime: n/c Lowest sugar was 220 >>...  115 >> 92 >> 65; he has hypoglycemia awareness in the 70s. Highest sugar was 346 >>...  240 >> 171 >> 157  Glucometer: Accu-Chek  Pt's meals are: - Breakfast: cereal (rice crispies) with milk, boiled eggs - Lunch: banana sandwich, whole wheat - Dinner: varies - Snacks: 2-3x Despite repeated advice about stopping orange juice (with b'fast - 12 oz) and milk (with dinner - 12 oz), he continues on these, but decreased the amount.  -+ Mild CKD, last BUN/creatinine:  Lab Results  Component Value Date   BUN 25 (H) 01/26/2021   BUN 22 12/01/2020   CREATININE 1.28 (H) 01/26/2021   CREATININE 1.19 12/01/2020  On valsartan 320.  -+ HL; last set of lipids: Lab Results  Component Value Date   CHOL 124 12/01/2020   HDL 37 (L) 12/01/2020   LDLCALC 67 12/01/2020   TRIG 106 12/01/2020   CHOLHDL 3.4 12/01/2020  He is not on a statin.  - last eye exam was in 2021: No DR reportedly, + cataract.  Before last visit he had an episode of diplopia, resolved. He had retinal detachment sx 03/2021. Plans to also have cataract sx. In 08/2021.  - no numbness and tingling in his feet.  Pt has FH of DM in [place].  ROS: + see HPI  I reviewed pt's medications, allergies, PMH, social hx, family hx, and changes were documented in the history  of present illness. Otherwise, unchanged from my initial visit note.  Past Medical History:  Diagnosis Date   Atrial fibrillation (HCC)    Cervical lymphadenopathy    Erectile dysfunction    GERD (gastroesophageal reflux disease)    Hypertension    Lymphoma, follicular (HCC)    Dr.Shadad   Past Surgical History:  Procedure Laterality Date   a bsc     ABSCESS DRAINAGE     under rt eye    DEEP NECK LYMPH NODE BIOPSY / EXCISION  2011   VASECTOMY     Social History   Socioeconomic History   Marital status: Married    Spouse name: Not on file   Number of children: 2  Occupational History    Saw Mining engineer  Tobacco Use   Smoking status: Never Smoker    Smokeless tobacco: Never Used  Substance and Sexual Activity   Alcohol use: No   Drug use: No   Sexual activity: Yes    Partners: Female   Current Outpatient Medications on File Prior to Visit  Medication Sig Dispense Refill   metoprolol tartrate (LOPRESSOR) 25 MG tablet Take 1 tablet (25 mg total) by mouth 2 (two) times daily. 180 tablet 3   Accu-Chek FastClix Lancets MISC USE AS DIRECTED 102 each 1   ACCU-CHEK GUIDE test strip USE TO CHECK FASTING BLOOD SUGAR AND 2 HRS AFTER LARGEST MEAL 100 strip 1   apixaban (ELIQUIS) 5 MG TABS tablet TAKE 1 TABLET (5 MG TOTAL) BY MOUTH 2 (TWO) TIMES DAILY. MUST SEE MD FOR FUTURE REFILLS. 60 tablet 3   Cholecalciferol (VITAMIN D3) 125 MCG (5000 UT) CAPS Take 1 capsule (5,000 Units total) by mouth daily. 90 capsule 0   dapagliflozin propanediol (FARXIGA) 10 MG TABS tablet Take 1 tablet (10 mg total) by mouth daily. 90 tablet 3   metFORMIN (GLUCOPHAGE-XR) 500 MG 24 hr tablet Take 2 tablets (1,000 mg total) by mouth daily with supper. 180 tablet 3   OZEMPIC, 1 MG/DOSE, 4 MG/3ML SOPN INJECT 0.75 MLS (1 MG TOTAL) INTO THE SKIN ONCE A WEEK. 3 mL 2   repaglinide (PRANDIN) 1 MG tablet Take 1 tablet (1 mg total) by mouth daily before supper. 90 tablet 3   valsartan-hydrochlorothiazide (DIOVAN-HCT) 320-12.5 MG tablet Take 1 tablet by mouth daily. 90 tablet 0   No current facility-administered medications on file prior to visit.   Allergies  Allergen Reactions   Ampicillin Rash   Family History  Problem Relation Age of Onset   Hypertension Mother    Kidney disease Mother    Hypertension Father    Hypertension Brother    Diabetes Paternal Grandfather    Hypertension Paternal Grandfather    Kidney disease Maternal Aunt    Kidney disease Maternal Grandmother    PE: BP 122/82    Pulse 80    Ht 5\' 10"  (1.778 m)    Wt 217 lb 3.2 oz (98.5 kg)    SpO2 99%    BMI 31.16 kg/m  Wt Readings from Last 3 Encounters:  05/28/21 217 lb 3.2 oz (98.5 kg)  03/30/21 220  lb 8 oz (100 kg)  01/26/21 220 lb 12.8 oz (100.2 kg)   Constitutional: overweight, in NAD Eyes: PERRLA, EOMI, no exophthalmos ENT: moist mucous membranes, no thyromegaly, no cervical lymphadenopathy Cardiovascular: RRR, No MRG Respiratory: CTA B Musculoskeletal: no deformities, strength intact in all 4 Skin: moist, warm, no rashes Neurological: no tremor with outstretched hands, DTR normal in all 4  ASSESSMENT:  1. DM2, non-insulin-dependent, now controlled, with long-term complications - mild CKD - ED  2. HL  3.  Obesity class I  PLAN:  1. Patient with longstanding previously uncontrolled, type 2 diabetes, on oral antidiabetic regimen with metformin, meglitinide, SGLT2 inhibitor, and also weekly GLP-1 receptor agonist, with improved control after adding Ozempic (HbA1c decreased to 5.9%).  However, afterwards, sugars started to increase and we added glipizide.  He had an episode of hypoglycemia afterwards so we changed to Prandin.  He is tolerating this well, without further hypoglycemia.  I discussed with him in the past about eliminating orange juice and milk.  At last visit, sugars are mostly at goal, improved in the previous 3 weeks, especially in the morning, but he still had some blood sugars above target around this time.  We did not change his regimen but we did discuss about eliminating milk, which he had added back with dinner -in an effort to eliminate the need for Prandin.  HbA1c before last visit was 6.6%. -At today's visit, he mentions that he is still drinking orange juice and milk, though less than before.  Reviewing his blood sugar log, his sugars are mostly at goal, with only an occasional mild hyperglycemic spike in the morning especially now during the holidays.  We again discussed about stopping sweet drinks, but otherwise, I do not feel we need a change in regimen. - I suggested to:  Patient Instructions  Please continue: - Metformin ER 1000 mg with dinner - Prandin 1  mg 30 min before dinner - Farxiga 10 mg before breakfast - Ozempic 1 mg weekly in am   Please return in 4 months with your sugar log.   - we checked his HbA1c: 6.3% (lower) - advised to check sugars at different times of the day - 1x a day, rotating check times - advised for yearly eye exams >> he is not UTD but will have an appointment soon - return to clinic in 4 months  2. HL -Reviewed latest lipid panel from 11/2020: Fractions at goal with the exception of a slightly low HDL: Lab Results  Component Value Date   CHOL 124 12/01/2020   HDL 37 (L) 12/01/2020   LDLCALC 67 12/01/2020   TRIG 106 12/01/2020   CHOLHDL 3.4 12/01/2020  -he is not on a statin, but we discussed about benefits of adding a statin beyond lowering cholesterol and he agrees to start pravastatin 20 mg daily.  3.  Obesity class I -continue SGLT 2 inhibitor and GLP-1 receptor agonist which should also help with weight loss -He gained 2 pounds before last visit -At this visit, he is -3 pounds compared to last visit  Philemon Kingdom, MD PhD [facility] Endocrinology

## 2021-05-30 HISTORY — PX: CATARACT EXTRACTION: SUR2

## 2021-06-04 ENCOUNTER — Ambulatory Visit: Payer: BC Managed Care – PPO | Admitting: Internal Medicine

## 2021-06-06 ENCOUNTER — Other Ambulatory Visit: Payer: Self-pay | Admitting: Internal Medicine

## 2021-06-06 DIAGNOSIS — I480 Paroxysmal atrial fibrillation: Secondary | ICD-10-CM

## 2021-06-07 NOTE — Telephone Encounter (Signed)
Pt last saw Dr [name] 03/30/21, last labs 01/26/21 Creat 1.28, age 71, weight 98.5kg, based on specified criteria pt is on appropriate dosage of Eliquis 5mg  BID for afib.  Will refill rx.

## 2021-07-25 ENCOUNTER — Other Ambulatory Visit: Payer: Self-pay | Admitting: Internal Medicine

## 2021-07-26 ENCOUNTER — Other Ambulatory Visit: Payer: Self-pay | Admitting: Physician Assistant

## 2021-07-26 DIAGNOSIS — E1159 Type 2 diabetes mellitus with other circulatory complications: Secondary | ICD-10-CM

## 2021-07-26 DIAGNOSIS — I152 Hypertension secondary to endocrine disorders: Secondary | ICD-10-CM

## 2021-08-02 ENCOUNTER — Other Ambulatory Visit: Payer: Self-pay

## 2021-08-02 DIAGNOSIS — E1159 Type 2 diabetes mellitus with other circulatory complications: Secondary | ICD-10-CM

## 2021-08-02 DIAGNOSIS — I152 Hypertension secondary to endocrine disorders: Secondary | ICD-10-CM

## 2021-08-02 MED ORDER — VALSARTAN-HYDROCHLOROTHIAZIDE 320-12.5 MG PO TABS: 1.0000 | ORAL_TABLET | Freq: Every day | ORAL | 0 refills | Status: DC

## 2021-08-11 NOTE — Progress Notes (Signed)
?Established patient visit ? ? ?Patient: Tom Fuller   DOB: Apr 19, 1951   71 y.o. Male  MRN: [id number] ?Visit Date: 08/12/2021 ? ?Chief Complaint  ?Patient presents with  ? Follow-up  ? Diabetes  ? Hypertension  ? ?Subjective  ?  ?HPI  ?Patient presents for follow-up on diabetes mellitus, hypertension, and hyperlipidemia. Patient has no acute concerns. ? ?Diabetes: Followed by endocrinology. Pt denies increased urination or thirst. Pt reports medication compliance. Reports one hypoglycemic event after supper few weeks ago which improved after eating a piece of candy. Checking glucose at home. FBS average 106. Reports not diligent with limiting sugar. ? ?HTN: Pt denies chest pain, palpitations, dizziness, shortness of breath, fatigue or lower extremity swelling. Taking medication as directed without side effects. Checks BP at home and readings <130/80. Monitors sodium intake.  ? ?HLD: Pt taking medication as directed without issues. Reports eats a balanced diet but does like to eat red meat. ? ? ?Medications: ?Outpatient Medications Prior to Visit  ?Medication Sig  ? Accu-Chek FastClix Lancets MISC USE AS DIRECTED  ? ACCU-CHEK GUIDE test strip USE TO CHECK FASTING BLOOD SUGAR AND 2 HRS AFTER LARGEST MEAL  ? apixaban (ELIQUIS) 5 MG TABS tablet Take 1 tablet (5 mg total) by mouth 2 (two) times daily.  ? Cholecalciferol (VITAMIN D3) 125 MCG (5000 UT) CAPS Take 1 capsule (5,000 Units total) by mouth daily.  ? dapagliflozin propanediol (FARXIGA) 10 MG TABS tablet Take 1 tablet (10 mg total) by mouth daily.  ? metFORMIN (GLUCOPHAGE-XR) 500 MG 24 hr tablet TAKE 1 TABLET (500 MG TOTAL) BY MOUTH 2 (TWO) TIMES DAILY AFTER A MEAL.  ? metoprolol tartrate (LOPRESSOR) 25 MG tablet Take 1 tablet (25 mg total) by mouth 2 (two) times daily.  ? pravastatin (PRAVACHOL) 20 MG tablet Take 1 tablet (20 mg total) by mouth daily.  ? repaglinide (PRANDIN) 1 MG tablet Take 1 tablet (1 mg total) by mouth daily before supper.  ? Semaglutide, 1  MG/DOSE, (OZEMPIC, 1 MG/DOSE,) 4 MG/3ML SOPN INJECT 0.75 MLS (1 MG TOTAL) INTO THE SKIN ONCE A WEEK.  ? [DISCONTINUED] valsartan-hydrochlorothiazide (DIOVAN-HCT) 320-12.5 MG tablet Take 1 tablet by mouth daily.  ? ?No facility-administered medications prior to visit.  ? ? ?Review of Systems ?Review of Systems:  ?A fourteen system review of systems was performed and found to be positive as per HPI. ? ? ?  Objective  ?  ?BP 101/71   Pulse 86   Temp 97.8 ?F (36.6 ?C)   Ht '5\' 10"'$  (1.778 m)   Wt 212 lb (96.2 kg)   SpO2 98%   BMI 30.42 kg/m?  ?BP Readings from Last 3 Encounters:  ?08/12/21 101/71  ?05/28/21 122/82  ?03/30/21 126/70  ? ?Wt Readings from Last 3 Encounters:  ?08/12/21 212 lb (96.2 kg)  ?05/28/21 217 lb 3.2 oz (98.5 kg)  ?03/30/21 220 lb 8 oz (100 kg)  ? ? ?Physical Exam  ?General:  Well Developed, well nourished, appropriate for stated age.  ?Neuro:  Alert and oriented,  extra-ocular muscles intact  ?HEENT:  Normocephalic, atraumatic, neck supple  ?Skin:  no gross rash, warm, pink. ?Cardiac:  RRR, S1 S2 ?Respiratory: CTA B/L  ?Vascular:  Ext warm, no cyanosis apprec.; cap RF less 2 sec. ?Psych:  No HI/SI, judgement and insight good, Euthymic mood. Full Affect. ? ? ?No results found for any visits on 08/12/21. ? Assessment & Plan  ?  ? ? ?Problem List Items Addressed This Visit   ? ?  ?  Cardiovascular and Mediastinum  ? ATRIAL FIBRILLATION  ?  -Followed by cardiology. On beta blocker therapy. Asymptomatic. ?  ?  ? Relevant Medications  ? valsartan-hydrochlorothiazide (DIOVAN-HCT) 320-12.5 MG tablet  ? Hypertension associated with diabetes ([place]) - Primary  ?  -Controlled. ?-Continue current medication regimen. ?-Discussed low salt diet. ?-Will collect CMP for medication monitoring with CPE. ?-Will continue to monitor. ?  ?  ? Relevant Medications  ? valsartan-hydrochlorothiazide (DIOVAN-HCT) 320-12.5 MG tablet  ?  ? Endocrine  ? Type 2 diabetes mellitus with chronic kidney disease, without long-term  current use of insulin ([place])  ?  -Followed by endocrinology. ?-Last A1c improved from 6.6 to 6.3. ?-Pt should continue current medication regimen. Advised to continue ambulatory glucose monitoring and discussed management for hypoglycemic events. If hypoglycemia becomes more frequent then recommend discussing with endocrinologist medication adjustments. ?-Recommend to follow a diabetic diet. ?-Will continue to monitor. ?  ?  ? Relevant Medications  ? valsartan-hydrochlorothiazide (DIOVAN-HCT) 320-12.5 MG tablet  ? Hyperlipidemia associated with type 2 diabetes mellitus ([place])  ?  -Last lipid panel: HDL 37, LDL 67 ?-Continue current medication regimen. Discussed low fat diet. ?-Will repeat lipid panel and hepatic function with CPE. ?-Will continue to monitor.  ?  ?  ? Relevant Medications  ? valsartan-hydrochlorothiazide (DIOVAN-HCT) 320-12.5 MG tablet  ?  ? Other  ? Lymphoma ([place])  ?  -Followed by Oncology. ?  ?  ? ? ?Return in about 4 months (around 12/12/2021) for CPE and FBW.  ?   ? ? ? ?Lorrene Reid, PA-C  ?[facility] ?[phone] (phone) ?[phone] (fax) ? ?[facility] ?

## 2021-08-12 ENCOUNTER — Other Ambulatory Visit: Payer: Self-pay

## 2021-08-12 ENCOUNTER — Ambulatory Visit: Payer: BC Managed Care – PPO | Admitting: Physician Assistant

## 2021-08-12 ENCOUNTER — Encounter: Payer: Self-pay | Admitting: Physician Assistant

## 2021-08-12 VITALS — BP 101/71 | HR 86 | Temp 97.8°F | Ht 70.0 in | Wt 212.0 lb

## 2021-08-12 DIAGNOSIS — E1122 Type 2 diabetes mellitus with diabetic chronic kidney disease: Secondary | ICD-10-CM

## 2021-08-12 DIAGNOSIS — I480 Paroxysmal atrial fibrillation: Secondary | ICD-10-CM | POA: Diagnosis not present

## 2021-08-12 DIAGNOSIS — E1169 Type 2 diabetes mellitus with other specified complication: Secondary | ICD-10-CM

## 2021-08-12 DIAGNOSIS — E1159 Type 2 diabetes mellitus with other circulatory complications: Secondary | ICD-10-CM | POA: Diagnosis not present

## 2021-08-12 DIAGNOSIS — C8280 Other types of follicular lymphoma, unspecified site: Secondary | ICD-10-CM

## 2021-08-12 DIAGNOSIS — I152 Hypertension secondary to endocrine disorders: Secondary | ICD-10-CM

## 2021-08-12 MED ORDER — VALSARTAN-HYDROCHLOROTHIAZIDE 320-12.5 MG PO TABS: 1.0000 | ORAL_TABLET | Freq: Every day | ORAL | 1 refills | Status: DC

## 2021-08-12 NOTE — Assessment & Plan Note (Signed)
-  Followed by cardiology. On beta blocker therapy. Asymptomatic. ?

## 2021-08-12 NOTE — Assessment & Plan Note (Signed)
-  Followed by endocrinology. ?-Last A1c improved from 6.6 to 6.3. ?-Pt should continue current medication regimen. Advised to continue ambulatory glucose monitoring and discussed management for hypoglycemic events. If hypoglycemia becomes more frequent then recommend discussing with endocrinologist medication adjustments. ?-Recommend to follow a diabetic diet. ?-Will continue to monitor. ?

## 2021-08-12 NOTE — Patient Instructions (Signed)
Hypoglycemia Hypoglycemia occurs when the level of sugar (glucose) in the blood is too low. Hypoglycemia can happen in people who have or do not have diabetes. It can develop quickly, and it can be a medical emergency. For most people, a blood glucose level below 70 mg/dL (3.9 mmol/L) is considered hypoglycemia. Glucose is a type of sugar that provides the body's main source of energy. Certain hormones (insulin and glucagon) control the level of glucose in the blood. Insulin lowers blood glucose, and glucagon raises blood glucose. Hypoglycemia can result from having too much insulin in the bloodstream, or from not eating enough food that contains glucose. You may also have reactive hypoglycemia, which happens within 4 hours after eating a meal. What are the causes? Hypoglycemia occurs most often in people who have diabetes and may be caused by: Diabetes medicine. Not eating enough, or not eating often enough. Increased physical activity. Drinking alcohol on an empty stomach. If you do not have diabetes, hypoglycemia may be caused by: A tumor in the pancreas. Not eating enough, or not eating for long periods at a time (fasting). A severe infection or illness. Problems after having bariatric surgery. Organ failure, such as kidney or liver failure. Certain medicines. What increases the risk? Hypoglycemia is more likely to develop in people who: Have diabetes and take medicines to lower blood glucose. Abuse alcohol. Have a severe illness. What are the signs or symptoms? Symptoms vary depending on whether the condition is mild, moderate, or severe. Mild hypoglycemia Hunger. Sweating and feeling clammy. Dizziness or feeling light-headed. Sleepiness or restless sleep. Nausea. Increased heart rate. Headache. Blurry vision. Mood changes, such as irritability or anxiety. Tingling or numbness around the mouth, lips, or tongue. Moderate hypoglycemia Confusion and poor judgment. Behavior  changes. Weakness. Irregular heartbeat. A change in coordination. Severe hypoglycemia Severe hypoglycemia is a medical emergency. It can cause: Fainting. Seizures. Loss of consciousness (coma). Death. How is this diagnosed? Hypoglycemia is diagnosed with a blood test to measure your blood glucose level. This blood test is done while you are having symptoms. Your health care provider may also do a physical exam and review your medical history. How is this treated? This condition can be treated by immediately eating or drinking something that contains sugar with 15 grams of fast-acting carbohydrate, such as: 4 oz (120 mL) of fruit juice. 4 oz (120 mL) of regular soda (not diet soda). Several pieces of hard candy. Check food labels to find out how many pieces to eat for 15 grams. 1 Tbsp (15 mL) of sugar or honey. 4 glucose tablets. 1 tube of glucose gel. Treating hypoglycemia if you have diabetes If you are alert and able to swallow safely, follow the 15:15 rule: Take 15 grams of a fast-acting carbohydrate. Talk with your health care provider about how much you should take. Options for getting 15 grams of fast-acting carbohydrate include: Glucose tablets (take 4 tablets). Several pieces of hard candy. Check food labels to find out how many pieces to eat for 15 grams. 4 oz (120 mL) of fruit juice. 4 oz (120 mL) of regular soda (not diet soda). 1 Tbsp (15 mL) of sugar or honey. 1 tube of glucose gel. Check your blood glucose 15 minutes after you take the carbohydrate. If the repeat blood glucose level is still at or below 70 mg/dL (3.9 mmol/L), take 15 grams of a carbohydrate again. If your blood glucose level does not increase above 70 mg/dL (3.9 mmol/L) after 3 tries, seek emergency   medical care. After your blood glucose level returns to normal, eat a meal or a snack within 1 hour.  Treating severe hypoglycemia Severe hypoglycemia is when your blood glucose level is below 54 mg/dL (3  mmol/L). Severe hypoglycemia is a medical emergency. Get medical help right away. If you have severe hypoglycemia and you cannot eat or drink, you will need to be given glucagon. A family member or close friend should learn how to check your blood glucose and how to give you glucagon. Ask your health care provider if you need to have an emergency glucagon kit available. Severe hypoglycemia may need to be treated in a hospital. The treatment may include getting glucose through an IV. You may also need treatment for the cause of your hypoglycemia. Follow these instructions at home: General instructions Take over-the-counter and prescription medicines only as told by your health care provider. Monitor your blood glucose as told by your health care provider. If you drink alcohol: Limit how much you have to: 0-1 drink a day for women who are not pregnant. 0-2 drinks a day for men. Know how much alcohol is in your drink. In the [place] one drink equals one 12 oz bottle of beer (355 mL), one 5 oz glass of wine (148 mL), or one 1 oz glass of hard liquor (44 mL). Be sure to eat food along with drinking alcohol. Be aware that alcohol is absorbed quickly and may have lingering effects that may result in hypoglycemia later. Be sure to do ongoing glucose monitoring. Keep all follow-up visits. This is important. If you have diabetes: Always have a fast-acting carbohydrate (15 grams) option with you to treat low blood glucose. Follow your diabetes management plan as directed by your health care provider. Make sure you: Know the symptoms of hypoglycemia. It is important to treat it right away to prevent it from becoming severe. Check your blood glucose as often as told. Always check before and after exercise. Always check your blood glucose before you drive a motorized vehicle. Take your medicines as told. Follow your meal plan. Eat on time, and do not skip meals. Share your diabetes management plan with  people in your workplace, school, and household. Carry a medical alert card or wear medical alert jewelry. Where to find more information American Diabetes Association: www.diabetes.org Contact a health care provider if: You have problems keeping your blood glucose in your target range. You have frequent episodes of hypoglycemia. Get help right away if: You continue to have hypoglycemia symptoms after eating or drinking something that contains 15 grams of fast-acting carbohydrate, and you cannot get your blood glucose above 70 mg/dL (3.9 mmol/L) while following the 15:15 rule. Your blood glucose is below 54 mg/dL (3 mmol/L). You have a seizure. You faint. These symptoms may represent a serious problem that is an emergency. Do not wait to see if the symptoms will go away. Get medical help right away. Call your local emergency services (911 in the [place] Do not drive yourself to the hospital. Summary Hypoglycemia occurs when the level of sugar (glucose) in the blood is too low. Hypoglycemia can happen in people who have or do not have diabetes. It can develop quickly, and it can be a medical emergency. Make sure you know the symptoms of hypoglycemia and how to treat it. Always have a fast-acting carbohydrate option with you to treat low blood sugar. This information is not intended to replace advice given to you by your health care provider. Make   sure you discuss any questions you have with your health care provider. Document Revised: 04/16/2020 Document Reviewed: 04/16/2020 Elsevier Patient Education  2022 Elsevier Inc.  

## 2021-08-12 NOTE — Assessment & Plan Note (Signed)
Followed by Oncology

## 2021-08-12 NOTE — Assessment & Plan Note (Signed)
-  Controlled. ?-Continue current medication regimen. ?-Discussed low salt diet. ?-Will collect CMP for medication monitoring with CPE. ?-Will continue to monitor. ?

## 2021-08-12 NOTE — Assessment & Plan Note (Addendum)
-  Last lipid panel: HDL 37, LDL 67 ?-Continue current medication regimen. Discussed low fat diet. ?-Will repeat lipid panel and hepatic function with CPE. ?-Will continue to monitor.  ?

## 2021-09-20 ENCOUNTER — Telehealth: Payer: Self-pay | Admitting: Oncology

## 2021-09-20 NOTE — Telephone Encounter (Signed)
Called patient regarding upcoming appointment, patient Is notified.  ?

## 2021-09-21 ENCOUNTER — Ambulatory Visit: Payer: BC Managed Care – PPO | Admitting: Oncology

## 2021-09-21 ENCOUNTER — Inpatient Hospital Stay: Payer: BC Managed Care – PPO | Admitting: Oncology

## 2021-09-21 ENCOUNTER — Other Ambulatory Visit: Payer: BC Managed Care – PPO

## 2021-09-21 ENCOUNTER — Other Ambulatory Visit: Payer: Self-pay

## 2021-09-21 ENCOUNTER — Inpatient Hospital Stay: Payer: BC Managed Care – PPO | Attending: Oncology

## 2021-09-21 VITALS — BP 136/91 | HR 97 | Temp 97.7°F | Resp 17 | Wt 210.3 lb

## 2021-09-21 DIAGNOSIS — C8290 Follicular lymphoma, unspecified, unspecified site: Secondary | ICD-10-CM | POA: Diagnosis present

## 2021-09-21 DIAGNOSIS — C8280 Other types of follicular lymphoma, unspecified site: Secondary | ICD-10-CM

## 2021-09-21 DIAGNOSIS — I4891 Unspecified atrial fibrillation: Secondary | ICD-10-CM | POA: Diagnosis not present

## 2021-09-21 LAB — CBC WITH DIFFERENTIAL (CANCER CENTER ONLY)
Abs Immature Granulocytes: 0.01 10*3/uL (ref 0.00–0.07)
Basophils Absolute: 0.1 10*3/uL (ref 0.0–0.1)
Basophils Relative: 1 %
Eosinophils Absolute: 0.4 10*3/uL (ref 0.0–0.5)
Eosinophils Relative: 5 %
HCT: 44.5 % (ref 39.0–52.0)
Hemoglobin: 15.3 g/dL (ref 13.0–17.0)
Immature Granulocytes: 0 %
Lymphocytes Relative: 29 %
Lymphs Abs: 2.3 10*3/uL (ref 0.7–4.0)
MCH: 31.9 pg (ref 26.0–34.0)
MCHC: 34.4 g/dL (ref 30.0–36.0)
MCV: 92.9 fL (ref 80.0–100.0)
Monocytes Absolute: 0.9 10*3/uL (ref 0.1–1.0)
Monocytes Relative: 11 %
Neutro Abs: 4.3 10*3/uL (ref 1.7–7.7)
Neutrophils Relative %: 54 %
Platelet Count: 248 10*3/uL (ref 150–400)
RBC: 4.79 MIL/uL (ref 4.22–5.81)
RDW: 13.1 % (ref 11.5–15.5)
WBC Count: 7.9 10*3/uL (ref 4.0–10.5)
nRBC: 0 % (ref 0.0–0.2)

## 2021-09-21 LAB — CMP (CANCER CENTER ONLY)
ALT: 11 U/L (ref 0–44)
AST: 13 U/L — ABNORMAL LOW (ref 15–41)
Albumin: 4.1 g/dL (ref 3.5–5.0)
Alkaline Phosphatase: 53 U/L (ref 38–126)
Anion gap: 6 (ref 5–15)
BUN: 25 mg/dL — ABNORMAL HIGH (ref 8–23)
CO2: 32 mmol/L (ref 22–32)
Calcium: 9.9 mg/dL (ref 8.9–10.3)
Chloride: 103 mmol/L (ref 98–111)
Creatinine: 1.13 mg/dL (ref 0.61–1.24)
GFR, Estimated: >60 mL/min (ref >60)
Glucose, Bld: 144 mg/dL — ABNORMAL HIGH (ref 70–99)
Potassium: 4.2 mmol/L (ref 3.5–5.1)
Sodium: 141 mmol/L (ref 135–145)
Total Bilirubin: 1 mg/dL (ref 0.3–1.2)
Total Protein: 6.7 g/dL (ref 6.5–8.1)

## 2021-09-21 NOTE — Progress Notes (Signed)
Hematology and Oncology Follow Up Visit ? ?Tom Fuller ?[id number] ?12/08/1950 71 y.o. ?09/21/2021 9:52 AM ? ?  ?CC: [name] [name], MD  ?Joyice Faster. Cornett, [name]  ?[name], DO  ? ?Principle Diagnosis: 71 year old man with stage IIIa follicular lymphoma diagnosed in May 2010.   ?   ? ?Past Therapy:  ?He had a period of observation to be 2010 and 2013. ? ?He is S/P Bendamustine and rituximab started on 08/25/11 for 6 cycles completed in 12/2011 and achieved complete response at this time. ? ?Current therapy: Active surveillance. ? ?Interim History:  Mr. Bissonette is here for a follow-up visit.  Since last visit, he reports feeling well without any major complaints.  He denies any fevers, chills or sweats.  He denies any lymphadenopathy.  He denies any petechiae or discomfort.  He remains active and continues to attend to activities of daily living.  He continues to work full-time without any decline in ability to do so. ? ? ? ?Medications: Reviewed without changes. ? ?Current Outpatient Medications  ?Medication Sig Dispense Refill  ? Accu-Chek FastClix Lancets MISC USE AS DIRECTED 102 each 1  ? ACCU-CHEK GUIDE test strip USE TO CHECK FASTING BLOOD SUGAR AND 2 HRS AFTER LARGEST MEAL 100 strip 1  ? apixaban (ELIQUIS) 5 MG TABS tablet Take 1 tablet (5 mg total) by mouth 2 (two) times daily. 60 tablet 6  ? Cholecalciferol (VITAMIN D3) 125 MCG (5000 UT) CAPS Take 1 capsule (5,000 Units total) by mouth daily. 90 capsule 0  ? dapagliflozin propanediol (FARXIGA) 10 MG TABS tablet Take 1 tablet (10 mg total) by mouth daily. 90 tablet 3  ? metFORMIN (GLUCOPHAGE-XR) 500 MG 24 hr tablet TAKE 1 TABLET (500 MG TOTAL) BY MOUTH 2 (TWO) TIMES DAILY AFTER A MEAL. 180 tablet 3  ? metoprolol tartrate (LOPRESSOR) 25 MG tablet Take 1 tablet (25 mg total) by mouth 2 (two) times daily. 180 tablet 3  ? pravastatin (PRAVACHOL) 20 MG tablet Take 1 tablet (20 mg total) by mouth daily. 90 tablet 3  ? repaglinide (PRANDIN) 1 MG tablet Take 1  tablet (1 mg total) by mouth daily before supper. 90 tablet 3  ? Semaglutide, 1 MG/DOSE, (OZEMPIC, 1 MG/DOSE,) 4 MG/3ML SOPN INJECT 0.75 MLS (1 MG TOTAL) INTO THE SKIN ONCE A WEEK. 9 mL 3  ? valsartan-hydrochlorothiazide (DIOVAN-HCT) 320-12.5 MG tablet Take 1 tablet by mouth daily. 90 tablet 1  ? ?No current facility-administered medications for this visit.  ? ? ?Allergies:  ?Allergies  ?Allergen Reactions  ? Ampicillin Rash  ? ? ? ? ? ? ?Physical Exam: ? ? ?Blood pressure (!) 136/91, pulse 97, temperature 97.7 ?F (36.5 ?C), temperature source Temporal, resp. rate 17, weight 210 lb 4.8 oz (95.4 kg), SpO2 99 %. ? ? ? ?ECOG: 0 ? ? ?General appearance: Comfortable appearing without any discomfort ?Head: Normocephalic without any trauma ?Oropharynx: Mucous membranes are moist and pink without any thrush or ulcers. ?Eyes: Pupils are equal and round reactive to light. ?Lymph nodes: No cervical, supraclavicular, inguinal or axillary lymphadenopathy.   ?Heart:regular rate and rhythm.  S1 and S2 without leg edema. ?Lung: Clear without any rhonchi or wheezes.  No dullness to percussion. ?Abdomin: Soft, nontender, nondistended with good bowel sounds.  No hepatosplenomegaly. ?Musculoskeletal: No joint deformity or effusion.  Full range of motion noted. ?Neurological: No deficits noted on motor, sensory and deep tendon reflex exam. ?Skin: No petechial rash or dryness.  Appeared moist.  ? ? ? ? ? ? ?  Lab Results: ?Lab Results  ?Component Value Date  ? WBC 8.6 01/26/2021  ? HGB 15.0 01/26/2021  ? HCT 41.6 01/26/2021  ? MCV 89.3 01/26/2021  ? PLT 216 01/26/2021  ? ? ?  ?  ? ?Impression and Plan: ? ?71 year old man with: ? ? ?1.  Follicular lymphoma diagnosed in 2010.  He was found to have stage IIIA and has been in remission after treatment in 2013. ? ?The natural course of this disease was reviewed at this time and treatment choices were reiterated.  He continues to be in remission without any indication for treatment at this time.   I recommended continued active surveillance for the time being.  Indication for treatment if he developed painful adenopathy or constitutional symptoms.  Salvage therapy options including oral targeted therapy versus systemic chemotherapy treatment.  He is agreeable to continue with active surveillance. ? ? ?2. Atrial fibrillation: No evidence of relapse at this time.  He is in normal sinus rhythm. ? ? ?3.  Follow-up: In 12 months for repeat follow-up. ? ?30  minutes were spent on this encounter.  The time was dedicated to reviewing laboratory data, disease status update and outlining future plan of care discussion. ? ? ? ? ?Zola Button ?4/25/20239:52 AM ?

## 2021-10-01 ENCOUNTER — Ambulatory Visit (INDEPENDENT_AMBULATORY_CARE_PROVIDER_SITE_OTHER): Payer: BC Managed Care – PPO | Admitting: Internal Medicine

## 2021-10-01 ENCOUNTER — Encounter: Payer: Self-pay | Admitting: Internal Medicine

## 2021-10-01 VITALS — BP 130/80 | HR 68 | Ht 70.0 in | Wt 209.4 lb

## 2021-10-01 DIAGNOSIS — E1122 Type 2 diabetes mellitus with diabetic chronic kidney disease: Secondary | ICD-10-CM | POA: Diagnosis not present

## 2021-10-01 DIAGNOSIS — E785 Hyperlipidemia, unspecified: Secondary | ICD-10-CM | POA: Diagnosis not present

## 2021-10-01 DIAGNOSIS — N189 Chronic kidney disease, unspecified: Secondary | ICD-10-CM | POA: Diagnosis not present

## 2021-10-01 DIAGNOSIS — E669 Obesity, unspecified: Secondary | ICD-10-CM | POA: Diagnosis not present

## 2021-10-01 LAB — POCT GLYCOSYLATED HEMOGLOBIN (HGB A1C): Hemoglobin A1C: 6.1 % — AB (ref 4.0–5.6)

## 2021-10-01 NOTE — Patient Instructions (Addendum)
Please continue: ?- Metformin ER 1000 mg with dinner ?- Farxiga 10 mg before breakfast ?- Ozempic 1 mg weekly in am  ? ?Please use: ?- Prandin 1 mg 30 min only before a larger dinner ? ?Please return in 4 months with your sugar log.  ?

## 2021-10-01 NOTE — Progress Notes (Signed)
Patient ID: Tom Fuller, male   DOB: Oct 23, 1950, 71 y.o.   MRN: [id number]  ? ?This visit occurred during the SARS-CoV-2 public health emergency.  Safety protocols were in place, including screening questions prior to the visit, additional usage of staff PPE, and extensive cleaning of exam room while observing appropriate contact time as indicated for disinfecting solutions.  ? ?HPI: ?Tom Fuller is a 71 y.o.-year-old male, presenting for follow-up for DM2, dx in 2017, non-insulin-dependent, uncontrolled, with complications (mild CKD, ED).  Last visit 4 months ago. ? ?Interim history: ?No increased urination, blurry vision, nausea, chest pain. ?He has occasional mm cramps. ? ?Reviewed HbA1c levels: ?Lab Results  ?Component Value Date  ? HGBA1C 6.3 (A) 05/28/2021  ? HGBA1C 6.6 (H) 12/01/2020  ? HGBA1C 6.1 (A) 09/24/2020  ? HGBA1C 6.9 (A) 05/26/2020  ? HGBA1C 6.9 (H) 09/25/2019  ? HGBA1C 5.9 (A) 08/16/2019  ? HGBA1C 8.0 (A) 02/08/2019  ? HGBA1C 8.5 (A) 07/06/2018  ? HGBA1C 12.1 (H) 01/12/2018  ? HGBA1C 10.7 (H) 09/19/2017  ? ?Pt is on a regimen of: ?- Metformin ER -started 06/2018 >> 500 mg 2x a day with meals >> 1000 mg with dinner ?- Farxiga 10 mg before b'fast ?-  Prandin 1 mg before dinner ?- Ozempic 0.5 mg weekly in am  - started 02/2018 >> mild constipation >> 1 mg weekly-increased 11/2019 ?He was on Glipizide 5 mg 2x a day before meals -started 01/2019 >> stopped after adding Ozempic ?He stopped regular metformin due to abdominal pain ? ?Pt checks his sugars once a day: ?- am: 105-144, 146, 162 >> 92-139, 146, 150 >> 97-133 ?- 2h after b'fast: n/c ?- before lunch: n/c >> 119 >> 123 >> n/c ?- 2h after lunch: n/c >> 141 >> n/c >> 106-119, 144 >> n/c ?- before dinner: 114-129, 134 >> 65, 105-122, 157 >> 101-125  ?- 2h after dinner: 92-114 >> 88, 125 >> 93-124 >> 61, 85-151 ?- bedtime: n/c >> 140 >> n/c >> 114, 139, 146 ?- nighttime: n/c ?Lowest sugar was 220 >>... 115 >> 92 >> 65>> 61; he has hypoglycemia  awareness in the 70s. ?Highest sugar was 346 >>...  240 >> 171 >> 157 >> 179. ? ?Glucometer: Accu-Chek ? ?Pt's meals are: ?- Breakfast: cereal (rice crispies) with milk, boiled eggs ?- Lunch: banana sandwich, whole wheat ?- Dinner: varies ?- Snacks: 2-3x ?Despite repeated advice about stopping orange juice (with b'fast - 12 oz) and milk (with dinner - 12 oz), he continues on these, but decreased the amount. ? ?-+ Mild CKD, last BUN/creatinine:  ?Lab Results  ?Component Value Date  ? BUN 25 (H) 09/21/2021  ? BUN 25 (H) 01/26/2021  ? CREATININE 1.13 09/21/2021  ? CREATININE 1.28 (H) 01/26/2021  ?On valsartan 320. ? ?-+ HL; last set of lipids: ?Lab Results  ?Component Value Date  ? CHOL 124 12/01/2020  ? HDL 37 (L) 12/01/2020  ? [place] 67 12/01/2020  ? TRIG 106 12/01/2020  ? CHOLHDL 3.4 12/01/2020  ?He was not on a statin >> we started Pravastatin 20 mg daily. ? ?- last eye exam was in 2022: No DR reportedly, + cataract.  Before last visit he had an episode of diplopia, resolved. He had retinal detachment sx 03/2021. Plans to also have cataract sx. In 08/2021. ? ?- no numbness and tingling in his feet. ? ?Pt has FH of DM in PGF. ? ?ROS: ?+ see HPI ? ?I reviewed pt's medications, allergies, PMH, social hx, family  hx, and changes were documented in the history of present illness. Otherwise, unchanged from my initial visit note. ? ?Past Medical History:  ?Diagnosis Date  ? Atrial fibrillation ([place])   ? Cervical lymphadenopathy   ? Erectile dysfunction   ? GERD (gastroesophageal reflux disease)   ? Hypertension   ? Lymphoma, follicular ([place])   ? Dr.Shadad  ? ?Past Surgical History:  ?Procedure Laterality Date  ? a bsc    ? ABSCESS DRAINAGE    ? under rt eye   ? DEEP NECK LYMPH NODE BIOPSY / EXCISION  2011  ? VASECTOMY    ? ?Social History  ? ?Socioeconomic History  ? Marital status: Married  ?  Spouse name: Not on file  ? 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 Smoking status: Never  Smoker  ? Smokeless tobacco: Never Used  ?Substance and Sexual Activity  ? Alcohol use: No  ? Drug use: No  ? Sexual activity: Yes  ?  Partners: Female  ? ?Current Outpatient Medications on File Prior to Visit  ?Medication Sig Dispense Refill  ? Accu-Chek FastClix Lancets MISC USE AS DIRECTED 102 each 1  ? ACCU-CHEK GUIDE test strip USE TO CHECK FASTING BLOOD SUGAR AND 2 HRS AFTER LARGEST MEAL 100 strip 1  ? apixaban (ELIQUIS) 5 MG TABS tablet Take 1 tablet (5 mg total) by mouth 2 (two) times daily. 60 tablet 6  ? Cholecalciferol (VITAMIN D3) 125 MCG (5000 UT) CAPS Take 1 capsule (5,000 Units total) by mouth daily. 90 capsule 0  ? dapagliflozin propanediol (FARXIGA) 10 MG TABS tablet Take 1 tablet (10 mg total) by mouth daily. 90 tablet 3  ? metFORMIN (GLUCOPHAGE-XR) 500 MG 24 hr tablet TAKE 1 TABLET (500 MG TOTAL) BY MOUTH 2 (TWO) TIMES DAILY AFTER A MEAL. 180 tablet 3  ? metoprolol tartrate (LOPRESSOR) 25 MG tablet Take 1 tablet (25 mg total) by mouth 2 (two) times daily. 180 tablet 3  ? pravastatin (PRAVACHOL) 20 MG tablet Take 1 tablet (20 mg total) by mouth daily. 90 tablet 3  ? repaglinide (PRANDIN) 1 MG tablet Take 1 tablet (1 mg total) by mouth daily before supper. 90 tablet 3  ? Semaglutide, 1 MG/DOSE, (OZEMPIC, 1 MG/DOSE,) 4 MG/3ML SOPN INJECT 0.75 MLS (1 MG TOTAL) INTO THE SKIN ONCE A WEEK. 9 mL 3  ? valsartan-hydrochlorothiazide (DIOVAN-HCT) 320-12.5 MG tablet Take 1 tablet by mouth daily. 90 tablet 1  ? ?No current facility-administered medications on file prior to visit.  ? ?Allergies  ?Allergen Reactions  ? Ampicillin Rash  ? ?Family History  ?Problem Relation Age of Onset  ? Hypertension Mother   ? Kidney disease Mother   ? Hypertension Father   ? Hypertension Brother   ? Diabetes Paternal Grandfather   ? Hypertension Paternal Grandfather   ? Kidney disease Maternal Aunt   ? Kidney disease Maternal Grandmother   ? ?PE: ?BP 130/80 (BP Location: Left Arm, Patient Position: Sitting, Cuff Size: Normal)    Pulse 68   Ht '5\' 10"'$  (1.778 m)   Wt 209 lb 6.4 oz (95 kg)   SpO2 98%   BMI 30.05 kg/m?  ?Wt Readings from Last 3 Encounters:  ?10/01/21 209 lb 6.4 oz (95 kg)  ?09/21/21 210 lb 4.8 oz (95.4 kg)  ?08/12/21 212 lb (96.2 kg)  ? ?Constitutional: overweight, in NAD ?Eyes: PERRLA, EOMI, no exophthalmos ?ENT: moist mucous membranes, no thyromegaly, no cervical lymphadenopathy ?Cardiovascular: RRR,  No MRG ?Respiratory: CTA B ?Musculoskeletal: no deformities, strength intact in all 4 ?Skin: moist, warm, no rashes ?Neurological: no tremor with outstretched hands, DTR normal in all 4 ? ?ASSESSMENT: ?1. DM2, non-insulin-dependent, now controlled, with long-term complications ?- mild CKD ?- ED ? ?2. HL ? ?3.  Obesity class I ? ?PLAN:  ?1. Patient with longstanding, previously uncontrolled, type 2 diabetes, on oral antidiabetic regimen with metformin, meglitinide, SGLT2 inhibitor, and also weekly GLP-1 receptor agonist, with improved control after adding Ozempic (HbA1c decreased to 5.9%).  However, afterwards, sugars started to increase and we had to add glipizide.  He had an episode of hypoglycemia afterwards so we changed to Prandin.  He tolerates this well, without further hypoglycemia. ?-At last visit, sugars were mostly at goal with only occasional mild hyperglycemic spikes in the morning during the holidays.  He was still drinking orange juice and milk, but less than before.  I advised him to stop completely.  Otherwise, we did not change the regimen. ?-At today's visit, sugars appear to be mostly at goal, but he has occasional low blood sugars after dinner.  Therefore, at today's visit I advised him to take Prandin only before a larger dinner, as needed, not every day.  Otherwise, we can continue the current regimen. ?-He does describe muscle cramps occasionally and I did advise him to try to stay well-hydrated while on Farxiga. ?- I suggested to:  ?Patient Instructions  ?Please continue: ?- Metformin ER 1000 mg with  dinner ?- Farxiga 10 mg before breakfast ?- Ozempic 1 mg weekly in am  ? ?Please use: ?- Prandin 1 mg 30 min only before a larger dinner ? ?Please return in 4 months with your sugar log.  ? ?- we checked his HbA

## 2021-10-06 ENCOUNTER — Telehealth: Payer: Self-pay

## 2021-10-06 NOTE — Telephone Encounter (Signed)
Pt called to confirm how he should be taking his Metformin. He says he was previously taking 2 with dinner but at his last appt it was discussed to split it up. He could not remember when else he should be taking Metformin. ?

## 2021-10-06 NOTE — Telephone Encounter (Signed)
He can take it either breakfast and dinner or lunch and dinner, per his preference. ?

## 2021-10-07 NOTE — Telephone Encounter (Signed)
Pt's wife Zigmund Daniel advised of pt recommendation.  ?

## 2021-10-11 ENCOUNTER — Telehealth: Payer: Self-pay

## 2021-10-11 NOTE — Telephone Encounter (Signed)
Pt called to advise morning blood sugars have been running high the last few days. Between 124 and 140. He has started taking Metformin in the morning and with dinner. Wanted to know what he could do to bring sugars down in the morning. ?

## 2021-10-12 NOTE — Telephone Encounter (Signed)
Called and advised pt of recommendation. Pt verbalized understanding. ?

## 2021-12-08 ENCOUNTER — Other Ambulatory Visit: Payer: Self-pay | Admitting: Internal Medicine

## 2021-12-14 ENCOUNTER — Encounter: Payer: BC Managed Care – PPO | Admitting: Physician Assistant

## 2021-12-20 ENCOUNTER — Other Ambulatory Visit: Payer: Self-pay | Admitting: Internal Medicine

## 2021-12-20 DIAGNOSIS — E1151 Type 2 diabetes mellitus with diabetic peripheral angiopathy without gangrene: Secondary | ICD-10-CM

## 2022-01-13 ENCOUNTER — Other Ambulatory Visit: Payer: Self-pay | Admitting: Internal Medicine

## 2022-01-13 DIAGNOSIS — I480 Paroxysmal atrial fibrillation: Secondary | ICD-10-CM

## 2022-01-13 NOTE — Telephone Encounter (Signed)
Eliquis '5mg'$  refill request received. Patient is 71 years old, weight-95kg, Crea-1.13 on 09/21/2021, Diagnosis-Afib, and last seen by Dr. [name] on 03/30/2021. Dose is appropriate based on dosing criteria. Will send in refill to requested pharmacy.

## 2022-01-19 ENCOUNTER — Telehealth: Payer: Self-pay | Admitting: Oncology

## 2022-01-19 NOTE — Telephone Encounter (Signed)
Called patient regarding 2024 appointment, I was unable to reach him. Calender will be mailed.

## 2022-01-22 ENCOUNTER — Other Ambulatory Visit: Payer: Self-pay | Admitting: Internal Medicine

## 2022-01-22 ENCOUNTER — Other Ambulatory Visit: Payer: Self-pay | Admitting: Physician Assistant

## 2022-01-22 DIAGNOSIS — I152 Hypertension secondary to endocrine disorders: Secondary | ICD-10-CM

## 2022-02-11 ENCOUNTER — Encounter: Payer: Self-pay | Admitting: Internal Medicine

## 2022-02-11 ENCOUNTER — Ambulatory Visit (INDEPENDENT_AMBULATORY_CARE_PROVIDER_SITE_OTHER): Payer: BC Managed Care – PPO | Admitting: Internal Medicine

## 2022-02-11 ENCOUNTER — Other Ambulatory Visit: Payer: Self-pay | Admitting: Internal Medicine

## 2022-02-11 VITALS — BP 124/80 | HR 91 | Ht 70.0 in | Wt 205.6 lb

## 2022-02-11 DIAGNOSIS — E1122 Type 2 diabetes mellitus with diabetic chronic kidney disease: Secondary | ICD-10-CM

## 2022-02-11 DIAGNOSIS — E785 Hyperlipidemia, unspecified: Secondary | ICD-10-CM

## 2022-02-11 DIAGNOSIS — E669 Obesity, unspecified: Secondary | ICD-10-CM | POA: Diagnosis not present

## 2022-02-11 LAB — POCT GLYCOSYLATED HEMOGLOBIN (HGB A1C): Hemoglobin A1C: 6.5 % — AB (ref 4.0–5.6)

## 2022-02-11 MED ORDER — OZEMPIC (1 MG/DOSE) 4 MG/3ML ~~LOC~~ SOPN: PEN_INJECTOR | SUBCUTANEOUS | 3 refills | Status: DC

## 2022-02-11 MED ORDER — METFORMIN HCL ER 500 MG PO TB24
ORAL_TABLET | ORAL | 3 refills | Status: DC
Start: 2022-02-11 — End: 2022-10-18

## 2022-02-11 NOTE — Patient Instructions (Addendum)
Please use the following regimen: - Metformin ER 1000 mg with dinner - Prandin 1 mg  before a larger dinner - Farxiga 10 mg before breakfast - Ozempic 1 mg weekly in am   Please return in 4 months with your sugar log.

## 2022-02-11 NOTE — Progress Notes (Signed)
Patient ID: Tom Fuller, male   DOB: [birth year], 71 y.o.   MRN: [id number]   HPI: Tom Fuller is a 71 y.o.-year-old male, presenting for follow-up for DM2, dx in 2017, non-insulin-dependent, uncontrolled, with complications (mild CKD, ED).  Last visit 4 months ago.  Interim history: No increased urination, blurry vision, nausea, chest pain.  Reviewed HbA1c levels: Lab Results  Component Value Date   HGBA1C 6.1 (A) 10/01/2021   HGBA1C 6.3 (A) 05/28/2021   HGBA1C 6.6 (H) 12/01/2020   HGBA1C 6.1 (A) 09/24/2020   HGBA1C 6.9 (A) 05/26/2020   HGBA1C 6.9 (H) 09/25/2019   HGBA1C 5.9 (A) 08/16/2019   HGBA1C 8.0 (A) 02/08/2019   HGBA1C 8.5 (A) 07/06/2018   HGBA1C 12.1 (H) 01/12/2018   Pt is on a regimen of: - Metformin ER -started 06/2018 >> 500 mg 2x a day with meals >> 1000 mg with dinner >> 500 mg with dinner and 500 mg at bedtime - Farxiga 10 mg before b'fast -  Prandin 1 mg before dinner >> stopped (not taking it with larger meals as advised at last visit) - Ozempic 0.5 mg weekly in am  - started 02/2018 >> mild constipation >> 1 mg weekly-increased 11/2019 He was on Glipizide 5 mg 2x a day before meals -started 01/2019 >> stopped after adding Ozempic He stopped regular metformin due to abdominal pain  Pt checks his sugars once a day: - am: 105-144, 146, 162 >> 92-139, 146, 150 >> 97-133 >> 116-156, 186 - 2h after b'fast: n/c - before lunch: n/c >> 119 >> 123 >> n/c - 2h after lunch: n/c >> 141 >> n/c >> 106-119, 144 >> n/c - before dinner: 114-129, 134 >> 65, 105-122, 157 >> 101-125 >> 111-152 - 2h after dinner: 92-114 >> 88, 125 >> 93-124 >> 61, 85-151 >> 154 - bedtime: n/c >> 140 >> n/c >> 114, 139, 146 - nighttime: n/c Lowest sugar was 220 >>... 115 >> 92 >> 65>> 61 >> 111; he has hypoglycemia awareness in the 70s. Highest sugar was 346 >>...  240 >> 171 >> 157 >> 179 >> 186.  Glucometer: Accu-Chek  Pt's meals are: - Breakfast: cereal (rice crispies) with milk, boiled  eggs - Lunch: banana sandwich, whole wheat - Dinner: varies - Snacks: 2-3x Despite repeated advice about stopping orange juice (with b'fast - 12 oz) and milk (with dinner - 12 oz), he continues on these, but decreased the amount.  -+ Mild CKD, last BUN/creatinine:  Lab Results  Component Value Date   BUN 25 (H) 09/21/2021   BUN 25 (H) 01/26/2021   CREATININE 1.13 09/21/2021   CREATININE 1.28 (H) 01/26/2021  On valsartan 320.  -+ HL; last set of lipids: Lab Results  Component Value Date   CHOL 124 12/01/2020   HDL 37 (L) 12/01/2020   LDLCALC 67 12/01/2020   TRIG 106 12/01/2020   CHOLHDL 3.4 12/01/2020  He was not on a statin >> we started Pravastatin 20 mg daily.  - last eye exam was in 2022: No DR reportedly, + cataract.  Before last visit he had an episode of diplopia, resolved. He had retinal detachment sx 03/2021. Plans to also have cataract sx. In 08/2021.  - no numbness and tingling in his feet.  Pt has FH of DM in PGF.  ROS: + see HPI  I reviewed pt's medications, allergies, PMH, social hx, family hx, and changes were documented in the history of present illness. Otherwise, unchanged from my initial visit  note.  Past Medical History:  Diagnosis Date   Atrial fibrillation ([place])    Cervical lymphadenopathy    Erectile dysfunction    GERD (gastroesophageal reflux disease)    Hypertension    Lymphoma, follicular ([facility])    Dr.Shadad   Past Surgical History:  Procedure Laterality Date   a bsc     ABSCESS DRAINAGE     under rt eye    DEEP NECK LYMPH NODE BIOPSY / EXCISION  2011   VASECTOMY     Social History   Socioeconomic History   Marital status: Married    Spouse name: Not on file   Number of children: 2  Occupational History    Saw Mining engineer  Tobacco Use   Smoking status: Never Smoker   Smokeless tobacco: Never Used  Substance and Sexual Activity   Alcohol use: No   Drug use: No   Sexual activity: Yes    Partners: Female   Current Outpatient  Medications on File Prior to Visit  Medication Sig Dispense Refill   Accu-Chek FastClix Lancets MISC USE AS DIRECTED 102 each 1   ACCU-CHEK GUIDE test strip USE TO CHECK FASTING BLOOD SUGAR AND 2 HRS AFTER LARGEST MEAL 100 strip 1   Cholecalciferol (VITAMIN D3) 125 MCG (5000 UT) CAPS Take 1 capsule (5,000 Units total) by mouth daily. 90 capsule 0   ELIQUIS 5 MG TABS tablet TAKE 1 TABLET BY MOUTH TWICE A DAY 60 tablet 6   FARXIGA 10 MG TABS tablet TAKE 1 TABLET BY MOUTH EVERY DAY 90 tablet 3   metFORMIN (GLUCOPHAGE-XR) 500 MG 24 hr tablet TAKE 1 TABLET (500 MG TOTAL) BY MOUTH 2 (TWO) TIMES DAILY AFTER A MEAL. 180 tablet 3   metoprolol tartrate (LOPRESSOR) 25 MG tablet TAKE 1 TABLET BY MOUTH TWICE A DAY 180 tablet 1   pravastatin (PRAVACHOL) 20 MG tablet Take 1 tablet (20 mg total) by mouth daily. 90 tablet 3   repaglinide (PRANDIN) 1 MG tablet TAKE 1 TABLET (1 MG TOTAL) BY MOUTH DAILY BEFORE SUPPER. 90 tablet 3   Semaglutide, 1 MG/DOSE, (OZEMPIC, 1 MG/DOSE,) 4 MG/3ML SOPN INJECT 0.75 MLS (1 MG TOTAL) INTO THE SKIN ONCE A WEEK. 9 mL 3   valsartan-hydrochlorothiazide (DIOVAN-HCT) 320-12.5 MG tablet TAKE 1 TABLET BY MOUTH EVERY DAY 90 tablet 0   No current facility-administered medications on file prior to visit.   Allergies  Allergen Reactions   Ampicillin Rash   Family History  Problem Relation Age of Onset   Hypertension Mother    Kidney disease Mother    Hypertension Father    Hypertension Brother    Diabetes Paternal Grandfather    Hypertension Paternal Grandfather    Kidney disease Maternal Aunt    Kidney disease Maternal Grandmother    PE: BP 124/80 (BP Location: Right Arm, Patient Position: Sitting, Cuff Size: Normal)   Pulse 91   Ht '5\' 10"'$  (1.778 m)   Wt 205 lb 9.6 oz (93.3 kg)   SpO2 97%   BMI 29.50 kg/m  Wt Readings from Last 3 Encounters:  02/11/22 205 lb 9.6 oz (93.3 kg)  10/01/21 209 lb 6.4 oz (95 kg)  09/21/21 210 lb 4.8 oz (95.4 kg)   Constitutional:  overweight, in NAD Eyes:  EOMI, no exophthalmos ENT: no neck masses, no cervical lymphadenopathy Cardiovascular: RRR, No MRG Respiratory: CTA B Musculoskeletal: no deformities Skin:no rashes Neurological: no tremor with outstretched hands Diabetic Foot Exam - Simple   Simple Foot Form Diabetic Foot exam  was performed with the following findings: Yes 02/11/2022  4:12 PM  Visual Inspection No deformities, no ulcerations, no other skin breakdown bilaterally: Yes Sensation Testing Intact to touch and monofilament testing bilaterally: Yes Pulse Check Posterior Tibialis and Dorsalis pulse intact bilaterally: Yes Comments B calluses.     ASSESSMENT: 1. DM2, non-insulin-dependent, now controlled, with long-term complications - mild CKD - ED  2. HL  3.  Obesity class I  PLAN:  1. Patient with longstanding, previously uncontrolled type 2 diabetes, on oral antidiabetic regimen with metformin, SGLT2 inhibitor and also weekly GLP-1 receptor agonist, with improved control after adding Ozempic.  At last visit, sugars are mostly at goal with occasional low blood sugars after dinner.  Therefore, I advised him to only take Prandin before a larger dinner, not every day.  We did not change the rest of the regimen.  He was describing muscle cramps occasionally and I did advise him to stay well-hydrated on Farxiga.  We also discussed about stopping orange juice and milk.  HbA1c was 6.1%, lower. -At today's visit, sugars are higher than before, mostly at goal in the morning but with hyperglycemic spikes after late dinners or dietary indiscretions night before.  He forgot about the instructions to take Prandin before larger meal, as discussed at last visitand  he stopped Prandin completely. -At today's visit, I advised him to take Prandin with a larger or later meal, especially with the holidays approaching.  Also, he tells me that he takes 500 mg of metformin with dinner and 500 mg at bedtime and he may  forget the bedtime dose.  I advised him to take the entire 1000 mg dose with dinner.  Otherwise, we can continue the same regimen - I suggested to:  Patient Instructions  Please use the following regimen: - Metformin ER 1000 mg with dinner - Prandin 1 mg  before a larger dinner - Farxiga 10 mg before breakfast - Ozempic 1 mg weekly in am   Please return in 4 months with your sugar log.   - we checked his HbA1c: 6.5% (higher) - advised to check sugars at different times of the day - 1x a day, rotating check times - advised for yearly eye exams >> he is due - return to clinic in 4 months  2. HL -Reviewed latest lipid panel from 11/2020: Fractions at goal with exception of a low HDL: Lab Results  Component Value Date   CHOL 124 12/01/2020   HDL 37 (L) 12/01/2020   LDLCALC 67 12/01/2020   TRIG 106 12/01/2020   CHOLHDL 3.4 12/01/2020  -I previously recommended pravastatin 20 mg daily -he is due for another lipid panel -we will check this at next visit  3.  Obesity class I -continue SGLT 2 inhibitor and GLP-1 receptor agonist which should also help with weight loss -He lost 11 pounds before the last 2 visits combined -lost 4 more lbs since last OV  Tom Kingdom, MD PhD [facility] Endocrin[facility]

## 2022-02-18 ENCOUNTER — Telehealth: Payer: Self-pay

## 2022-02-18 ENCOUNTER — Telehealth: Payer: Self-pay | Admitting: Internal Medicine

## 2022-02-18 NOTE — Telephone Encounter (Signed)
Patient will stop by the office and pick up a sample of the Ozempic he will take to shot of the 0.'5mg'$  dose to equal the 1 mg. The pen will have enough for 2 full dose of what he is currently taking. Patient will contact the insurance to find out what's going on. Patient states that he didn't pick up script that was sent on 02/11/22

## 2022-02-18 NOTE — Telephone Encounter (Signed)
Patient picked up Ozempic samples

## 2022-02-23 ENCOUNTER — Other Ambulatory Visit: Payer: Self-pay

## 2022-02-23 DIAGNOSIS — E1122 Type 2 diabetes mellitus with diabetic chronic kidney disease: Secondary | ICD-10-CM

## 2022-02-23 MED ORDER — OZEMPIC (1 MG/DOSE) 4 MG/3ML ~~LOC~~ SOPN: PEN_INJECTOR | SUBCUTANEOUS | 3 refills | Status: DC

## 2022-03-07 ENCOUNTER — Telehealth: Payer: Self-pay

## 2022-03-07 NOTE — Telephone Encounter (Signed)
Pt called to advise pharmacy does not have Ozempic in stock. Pt requested a sample until restock. Pt advised to stop by the office for sample Ozempic.

## 2022-03-08 NOTE — Telephone Encounter (Signed)
Patient picked up Ozempic sample

## 2022-03-14 ENCOUNTER — Telehealth: Payer: Self-pay

## 2022-03-14 NOTE — Telephone Encounter (Signed)
Patient picked up sample of Ozempic 

## 2022-03-14 NOTE — Telephone Encounter (Signed)
Pt unable to get Ozempic at the pharmacy. Pt currently on 1 mg weekly injection. Pt advised we have sample of 2 mg dose available for pick up that will cover him for 2 months. Sample labeled with pt's information.

## 2022-03-21 ENCOUNTER — Other Ambulatory Visit: Payer: Self-pay | Admitting: Internal Medicine

## 2022-03-21 ENCOUNTER — Other Ambulatory Visit: Payer: Self-pay | Admitting: Physician Assistant

## 2022-03-21 DIAGNOSIS — I152 Hypertension secondary to endocrine disorders: Secondary | ICD-10-CM

## 2022-05-13 ENCOUNTER — Other Ambulatory Visit: Payer: Self-pay

## 2022-05-13 DIAGNOSIS — I152 Hypertension secondary to endocrine disorders: Secondary | ICD-10-CM

## 2022-05-19 ENCOUNTER — Other Ambulatory Visit: Payer: Self-pay

## 2022-05-19 ENCOUNTER — Telehealth: Payer: Self-pay | Admitting: *Deleted

## 2022-05-19 DIAGNOSIS — E1159 Type 2 diabetes mellitus with other circulatory complications: Secondary | ICD-10-CM

## 2022-05-19 MED ORDER — VALSARTAN-HYDROCHLOROTHIAZIDE 320-12.5 MG PO TABS: 1.0000 | ORAL_TABLET | Freq: Every day | ORAL | 0 refills | Status: DC

## 2022-05-19 NOTE — Telephone Encounter (Signed)
A 60 day Rx has been [facility] [address] patient needs an appt for next refill

## 2022-05-19 NOTE — Telephone Encounter (Signed)
Pt calling requesting refill on below.  Please send refills to below pharmacy. Evadene Wardrip Zimmerman Rumple, CMA     valsartan-hydrochlorothiazide (DIOVAN-HCT)   [id number] [place]  [facility] [address]

## 2022-05-21 ENCOUNTER — Other Ambulatory Visit: Payer: Self-pay | Admitting: Nurse Practitioner

## 2022-05-21 DIAGNOSIS — I152 Hypertension secondary to endocrine disorders: Secondary | ICD-10-CM

## 2022-05-24 ENCOUNTER — Telehealth: Payer: Self-pay

## 2022-05-24 NOTE — Telephone Encounter (Deleted)
Pt is calling requesting a refill on: valsartan-hydrochlorothiazide (DIOVAN-HCT) 320-12.5 MG tablet   [id number] [place] [place] [place]   LOV 08/12/21 ROV 07/04/22  Pt is completely out of this medication and couldn't get scheduled for a CPE until 07/04/22.

## 2022-05-24 NOTE — Telephone Encounter (Signed)
Contacted pharmacy and gave the verbal for patients valsartan/hctz.  They said they never received it. Informed pharmacy that pt will need a refill for anymore refills. Cyani Kallstrom Zimmerman Rumple, CMA

## 2022-06-13 ENCOUNTER — Encounter: Payer: Self-pay | Admitting: Internal Medicine

## 2022-06-13 ENCOUNTER — Ambulatory Visit (INDEPENDENT_AMBULATORY_CARE_PROVIDER_SITE_OTHER): Payer: BC Managed Care – PPO | Admitting: Internal Medicine

## 2022-06-13 VITALS — BP 118/68 | HR 90 | Ht 70.0 in | Wt 208.4 lb

## 2022-06-13 DIAGNOSIS — E669 Obesity, unspecified: Secondary | ICD-10-CM

## 2022-06-13 DIAGNOSIS — E1122 Type 2 diabetes mellitus with diabetic chronic kidney disease: Secondary | ICD-10-CM

## 2022-06-13 DIAGNOSIS — E785 Hyperlipidemia, unspecified: Secondary | ICD-10-CM | POA: Diagnosis not present

## 2022-06-13 LAB — POCT GLYCOSYLATED HEMOGLOBIN (HGB A1C): Hemoglobin A1C: 6.8 % — AB (ref 4.0–5.6)

## 2022-06-13 NOTE — Progress Notes (Signed)
Patient ID: Tom Fuller, male   DOB: Aug 14, 1950, 72 y.o.   MRN: [id number]   HPI: Tom Fuller is a 72 y.o.-year-old male, presenting for follow-up for DM2, dx in 2017, non-insulin-dependent, uncontrolled, with complications (mild CKD, ED).  Last visit 4 months ago.  Interim history: No increased urination, has blurry vision, no nausea, no chest pain.  Reviewed HbA1c levels: Lab Results  Component Value Date   HGBA1C 6.5 (A) 02/11/2022   HGBA1C 6.1 (A) 10/01/2021   HGBA1C 6.3 (A) 05/28/2021   HGBA1C 6.6 (H) 12/01/2020   HGBA1C 6.1 (A) 09/24/2020   HGBA1C 6.9 (A) 05/26/2020   HGBA1C 6.9 (H) 09/25/2019   HGBA1C 5.9 (A) 08/16/2019   HGBA1C 8.0 (A) 02/08/2019   HGBA1C 8.5 (A) 07/06/2018   Pt is on a regimen of: - Metformin ER -started 06/2018 >> 500 mg 2x a day with meals >> 1000 mg with dinner >> 500 mg with dinner and 500 mg at bedtime >> 1000 mg with lunch (forgot to take it with dinner instead) - Farxiga 10 mg before b'fast - >> not taking - Ozempic 0.5 mg weekly in am  - started 02/2018 >> mild constipation >> 1 mg weekly-increased 11/2019 He was on Glipizide 5 mg 2x a day before meals -started 01/2019 >> stopped after adding Ozempic He stopped regular metformin due to abdominal pain  Pt checks his sugars once a day: - am: 92-139, 146, 150 >> 97-133 >> 116-156, 186 >> 113-159, 186, 193 - 2h after b'fast: n/c >> 119 - before lunch: n/c >> 119 >> 123 >> n/c - 2h after lunch: n/c >> 141 >> n/c >> 106-119, 144 >> n/c - before dinner: 65, 105-122, 157 >> 101-125 >> 111-152 >> 96-140, 164 - 2h after dinner: 88, 125 >> 93-124 >> 61, 85-151 >> 154 >> 114-192 - bedtime: n/c >> 140 >> n/c >> 114, 139, 146 >> 134, 195 - nighttime: n/c Lowest sugar was 220 >>... 65 >> 61 >> 111 >> 96; he has hypoglycemia awareness in the 70s. Highest sugar was 346 >>...  179 >> 186>> 193.  Glucometer: Accu-Chek  Pt's meals are: - Breakfast: cereal (rice crispies) with milk, boiled eggs - Lunch:  banana sandwich, whole wheat - Dinner: varies - Snacks: 2-3x Despite repeated advice about stopping orange juice (with b'fast - 12 oz) and milk (with dinner - 12 oz), he continues on these, but decreased the amount.  -+ Mild CKD, last BUN/creatinine:  Lab Results  Component Value Date   BUN 25 (H) 09/21/2021   BUN 25 (H) 01/26/2021   CREATININE 1.13 09/21/2021   CREATININE 1.28 (H) 01/26/2021  On valsartan 320.  -+ HL; last set of lipids: Lab Results  Component Value Date   CHOL 124 12/01/2020   HDL 37 (L) 12/01/2020   LDLCALC 67 12/01/2020   TRIG 106 12/01/2020   CHOLHDL 3.4 12/01/2020  He was not on a statin >> we started Pravastatin 20 mg daily. He takes this now.  - last eye exam was in 2023: No DR reportedly, + cataract.  Before last visit he had an episode of diplopia, resolved. He had retinal detachment sx 03/2021. He had cataract sx. In 2023.  - no numbness and tingling in his feet.  Last foot exam 02/11/2022.  Pt has FH of DM in PGF.  ROS: + see HPI  I reviewed pt's medications, allergies, PMH, social hx, family hx, and changes were documented in the history of present illness. Otherwise,  unchanged from my initial visit note.  Past Medical History:  Diagnosis Date   Atrial fibrillation ([place])    Cervical lymphadenopathy    Erectile dysfunction    GERD (gastroesophageal reflux disease)    Hypertension    Lymphoma, follicular (HCC)    Dr.Shadad   Past Surgical History:  Procedure Laterality Date   a bsc     ABSCESS DRAINAGE     under rt eye    DEEP NECK LYMPH NODE BIOPSY / EXCISION  2011   VASECTOMY     Social History   Socioeconomic History   Marital status: Married    Spouse name: Not on file   Number of children: 2  Occupational History    Saw Mining engineer  Tobacco Use   Smoking status: Never Smoker   Smokeless tobacco: Never Used  Substance and Sexual Activity   Alcohol use: No   Drug use: No   Sexual activity: Yes    Partners: Female    Current Outpatient Medications on File Prior to Visit  Medication Sig Dispense Refill   Accu-Chek FastClix Lancets MISC USE AS DIRECTED 102 each 1   ACCU-CHEK GUIDE test strip USE TO CHECK FASTING BLOOD SUGAR AND 2 HRS AFTER LARGEST MEAL 100 strip 1   Cholecalciferol (VITAMIN D3) 125 MCG (5000 UT) CAPS Take 1 capsule (5,000 Units total) by mouth daily. 90 capsule 0   ELIQUIS 5 MG TABS tablet TAKE 1 TABLET BY MOUTH TWICE A DAY 60 tablet 6   FARXIGA 10 MG TABS tablet TAKE 1 TABLET BY MOUTH EVERY DAY 90 tablet 3   metFORMIN (GLUCOPHAGE-XR) 500 MG 24 hr tablet TAKE 1 TABLET (500 MG TOTAL) BY MOUTH 2 (TWO) TIMES DAILY AFTER A MEAL. 180 tablet 3   metoprolol tartrate (LOPRESSOR) 25 MG tablet TAKE 1 TABLET BY MOUTH TWICE A DAY 180 tablet 1   pravastatin (PRAVACHOL) 20 MG tablet TAKE 1 TABLET BY MOUTH EVERY DAY 90 tablet 3   repaglinide (PRANDIN) 1 MG tablet TAKE 1 TABLET (1 MG TOTAL) BY MOUTH DAILY BEFORE SUPPER. 90 tablet 3   Semaglutide, 1 MG/DOSE, (OZEMPIC, 1 MG/DOSE,) 4 MG/3ML SOPN INJECT 0.75 MLS (1 MG TOTAL) INTO THE SKIN ONCE A WEEK. 3 mL 3   valsartan-hydrochlorothiazide (DIOVAN-HCT) 320-12.5 MG tablet TAKE 1 TABLET BY MOUTH EVERY DAY 90 tablet 0   No current facility-administered medications on file prior to visit.   Allergies  Allergen Reactions   Ampicillin Rash   Family History  Problem Relation Age of Onset   Hypertension Mother    Kidney disease Mother    Hypertension Father    Hypertension Brother    Diabetes Paternal Grandfather    Hypertension Paternal Grandfather    Kidney disease Maternal Aunt    Kidney disease Maternal Grandmother    PE: BP 118/68 (BP Location: Right Arm, Patient Position: Sitting, Cuff Size: Normal)   Pulse 90   Ht '5\' 10"'$  (1.778 m)   Wt 208 lb 6.4 oz (94.5 kg)   SpO2 99%   BMI 29.90 kg/m  Wt Readings from Last 3 Encounters:  06/13/22 208 lb 6.4 oz (94.5 kg)  02/11/22 205 lb 9.6 oz (93.3 kg)  10/01/21 209 lb 6.4 oz (95 kg)    Constitutional: overweight, in NAD Eyes:  EOMI, no exophthalmos ENT: no neck masses, no cervical lymphadenopathy Cardiovascular: RRR, No MRG Respiratory: CTA B Musculoskeletal: no deformities Skin:no rashes Neurological: no tremor with outstretched hands  ASSESSMENT: 1. DM2, non-insulin-dependent, now controlled, with long-term complications -  mild CKD - ED  2. HL  3.  Obesity class I  PLAN:  1. Patient with longstanding, previously uncontrolled type 2 diabetes, on oral antidiabetic regimen with metformin, meglitinide, SGLT2 inhibitor and also weekly GLP-1 receptor agonist, with improved control in the last 3 years.  At last visit, HbA1c was higher than before but still at goal, at 6.5%.  Sugars were higher than before, mostly at goal in the morning but with hyperglycemic spikes after late dinners or dietary indiscretions the night before.  Upon questioning, he forgot to take Prandin before larger meals and we discussed about how to take this.  He was taking 500 mg of metformin with dinner and 500 mg at bedtime and was telling me that he may forget the bedtime dose so I advised him to take the entire dose together at dinnertime.  Otherwise, we did not change his regimen. -At today's visit, sugars are at goal, but with more spikes in the hyperglycemic range.  These happen mostly in the morning after larger dinners.  Upon questioning, he is still not taking the repaglinide before larger dinners, so for now, I advised her to take it before every dinner.  I advised him to let me know if he starts experiencing low blood sugars.  Also, he misunderstood instructions at last visit and is taking the entire dose of metformin with lunch, rather than with dinner.  We will move the metformin with dinner.  This is his largest meal.  Otherwise, we can continue the same regimen. - I suggested to:  Patient Instructions  Please move: - Metformin ER 1000 mg with dinner  Also, take: - Prandin 1 mg  before  every dinner  Continue: - Farxiga 10 mg before breakfast - Ozempic 1 mg weekly in am   Please return in 4 months with your sugar log.   - we checked his HbA1c: 6.8% (higher) - advised to check sugars at different times of the day - 1x a day, rotating check times - advised for yearly eye exams >> he is UTD - return to clinic in 4 months  2. HL -Reviewed latest lipid panel from 11/2020: Fractions at goal with the exception of a low HDL: Lab Results  Component Value Date   CHOL 124 12/01/2020   HDL 37 (L) 12/01/2020   LDLCALC 67 12/01/2020   TRIG 106 12/01/2020   CHOLHDL 3.4 12/01/2020  -I previously recommended pravastatin 20 mg daily.  He is taking this now, without side effects -He has an appointment with PCP coming up in 1.5 months.  3.  Obesity class I -continue SGLT 2 inhibitor and GLP-1 receptor agonist which should also help with weight loss -He lost 15 pounds before the last 3 visits combined, of which 4 before last visit -He gained 3 pounds since last visit, probably due to the holidays.  Philemon Kingdom, MD PhD [facility]

## 2022-06-13 NOTE — Patient Instructions (Addendum)
Please move: - Metformin ER 1000 mg with dinner  Also, take: - Prandin 1 mg  before every dinner  Continue: - Farxiga 10 mg before breakfast - Ozempic 1 mg weekly in am   Please return in 4 months with your sugar log.

## 2022-06-29 ENCOUNTER — Other Ambulatory Visit: Payer: Self-pay | Admitting: Internal Medicine

## 2022-06-29 DIAGNOSIS — E1122 Type 2 diabetes mellitus with diabetic chronic kidney disease: Secondary | ICD-10-CM

## 2022-07-04 ENCOUNTER — Encounter: Payer: BC Managed Care – PPO | Admitting: Physician Assistant

## 2022-07-07 ENCOUNTER — Other Ambulatory Visit: Payer: Self-pay | Admitting: Nurse Practitioner

## 2022-07-07 ENCOUNTER — Telehealth: Payer: Self-pay | Admitting: *Deleted

## 2022-07-07 DIAGNOSIS — E1122 Type 2 diabetes mellitus with diabetic chronic kidney disease: Secondary | ICD-10-CM

## 2022-07-07 DIAGNOSIS — E1159 Type 2 diabetes mellitus with other circulatory complications: Secondary | ICD-10-CM

## 2022-07-07 MED ORDER — ACCU-CHEK GUIDE VI STRP: ORAL_STRIP | 1 refills | Status: AC

## 2022-07-07 NOTE — Telephone Encounter (Signed)
Sent!

## 2022-07-07 NOTE — Telephone Encounter (Signed)
Pt left a message requesting a refill on below.Tom Fuller, CMA      ACCU-CHEK GUIDE test strip    [id number] [place] [facility] [address]   LOV:08/12/21 ROV:08/02/22

## 2022-07-22 ENCOUNTER — Other Ambulatory Visit: Payer: Self-pay | Admitting: Internal Medicine

## 2022-07-26 ENCOUNTER — Telehealth: Payer: Self-pay | Admitting: Internal Medicine

## 2022-07-26 NOTE — Telephone Encounter (Signed)
Called pt to inform him that I was leaving him a $10 copay card at [facility] at the front desk for him to pick up. I advised the pt that if he has any other problems, questions or concerns, to give our office a call. Pt verbalized understanding.

## 2022-07-26 NOTE — Telephone Encounter (Signed)
Pt c/o medication issue:  1. Name of Medication:  Eliquis   2. How are you currently taking this medication (dosage and times per day)?   3. Are you having a reaction (difficulty breathing--STAT)?   4. What is your medication issue?  Patient states he has an Eliquis $10 co-pay card which has expired and there is a significant difference in the cost. He would like assistance with getting a new co-pay card.

## 2022-08-01 NOTE — Progress Notes (Unsigned)
Complete physical exam  Patient: Tom Fuller   DOB: April 17, 1951   72 y.o. Male  MRN: [id number]  Subjective:    No chief complaint on file.   [name] is a 72 y.o. male who presents today for a complete physical exam. He reports consuming a {diet types:17450} diet. {types:19826} He generally feels {DESC; WELL/FAIRLY WELL/POORLY:18703}. He reports sleeping {DESC; WELL/FAIRLY WELL/POORLY:18703}. He {does/does not:200015} have additional problems to discuss today.    Most recent fall risk assessment:    08/12/2021    4:37 PM  [place] in the past year? 1  Number falls in past yr: 0  Injury with Fall? 1  Risk for fall due to : History of fall(s);Impaired balance/gait  Follow up Falls evaluation completed     Most recent depression screenings:    08/12/2021    4:38 PM 08/03/2020    2:45 PM  PHQ 2/9 Scores  PHQ - 2 Score 2 0  PHQ- 9 Score 3 2    Patient Active Problem List   Diagnosis Date Noted   Hypertension associated with diabetes ([place]) 08/12/2021   Hyperlipidemia associated with type 2 diabetes mellitus ([place]) 08/12/2021   Chronic diastolic heart failure ([place]) 03/30/2021   DM (diabetes mellitus) type II uncontrolled, periph vascular disorder 09/25/2019   Obesity, Class I, BMI 30.0-34.9 (see actual BMI) 07/06/2018   Type 2 diabetes mellitus with chronic kidney disease, without long-term current use of insulin ([place]) 03/26/2016   Hyperlipidemia LDL goal <70 03/26/2016   Left otitis media 06/17/2015   Preventative health care 09/23/2010   ATRIAL FIBRILLATION 09/09/2008   Lymphoma ([place]) 08/07/2008   Enlarged lymph nodes 07/07/2008   GERD 07/25/2007   ERECTILE DYSFUNCTION 03/15/2007   Essential hypertension 02/27/2007    Past Surgical History:  Procedure Laterality Date   a bsc     ABSCESS DRAINAGE     under rt eye    DEEP NECK LYMPH NODE BIOPSY / EXCISION  2011   VASECTOMY     Social History   Tobacco Use   Smoking status: Never   Smokeless  tobacco: Never  Substance Use Topics   Alcohol use: No   Drug use: No   Family History  Problem Relation Age of Onset   Hypertension Mother    Kidney disease Mother    Hypertension Father    Hypertension Brother    Diabetes Paternal Grandfather    Hypertension Paternal Grandfather    Kidney disease Maternal Aunt    Kidney disease Maternal Grandmother    Allergies  Allergen Reactions   Ampicillin Rash     Patient Care Team: Velva Harman, PA as PCP - General (Family Medicine) Evans Lance, MD as Consulting Physician (Cardiology) Philemon Kingdom, MD as Consulting Physician (Endocrinology) Wyatt Portela, MD as Consulting Physician (Oncology)   Outpatient Medications Prior to Visit  Medication Sig   Accu-Chek FastClix Lancets MISC USE AS DIRECTED   Cholecalciferol (VITAMIN D3) 125 MCG (5000 UT) CAPS Take 1 capsule (5,000 Units total) by mouth daily.   ELIQUIS 5 MG TABS tablet TAKE 1 TABLET BY MOUTH TWICE A DAY   FARXIGA 10 MG TABS tablet TAKE 1 TABLET BY MOUTH EVERY DAY   glucose blood (ACCU-CHEK GUIDE) test strip Use as instructed   metFORMIN (GLUCOPHAGE-XR) 500 MG 24 hr tablet TAKE 1 TABLET (500 MG TOTAL) BY MOUTH 2 (TWO) TIMES DAILY AFTER A MEAL.   metoprolol tartrate (LOPRESSOR) 25 MG tablet TAKE 1  TABLET BY MOUTH TWICE A DAY   pravastatin (PRAVACHOL) 20 MG tablet TAKE 1 TABLET BY MOUTH EVERY DAY   repaglinide (PRANDIN) 1 MG tablet TAKE 1 TABLET (1 MG TOTAL) BY MOUTH DAILY BEFORE SUPPER.   Semaglutide, 1 MG/DOSE, (OZEMPIC, 1 MG/DOSE,) 4 MG/3ML SOPN INJECT 0.75 MLS (1 MG TOTAL) INTO THE SKIN ONCE A WEEK.   valsartan-hydrochlorothiazide (DIOVAN-HCT) 320-12.5 MG tablet TAKE 1 TABLET BY MOUTH EVERY DAY   No facility-administered medications prior to visit.    ROS     Objective:    There were no vitals taken for this visit.   Physical Exam   No results found for any visits on 08/02/22.     Assessment & Plan:    Routine Health Maintenance and Physical  Exam  Immunization History  Administered Date(s) Administered   Influenza Inj Mdck Quad Pf 03/22/2017   Influenza Split 03/28/2011   Influenza, High Dose Seasonal PF 03/27/2018   Pneumococcal Conjugate-13 07/20/2018   Td 07/25/2007   Tdap 01/12/2018    Health Maintenance  Topic Date Due   COVID-19 Vaccine (1) Never done   OPHTHALMOLOGY EXAM  Never done   Zoster Vaccines- Shingrix (1 of 2) Never done   Pneumonia Vaccine 38+ Years old (2 of 2 - PPSV23 or PCV20) 07/21/2019   Diabetic kidney evaluation - Urine ACR  09/24/2020   INFLUENZA VACCINE  12/28/2021   Diabetic kidney evaluation - eGFR measurement  09/22/2022   HEMOGLOBIN A1C  12/12/2022   FOOT EXAM  02/12/2023   DTaP/Tdap/Td (3 - Td or Tdap) 01/13/2028   Hepatitis C Screening  Completed   HPV VACCINES  Aged Out    Discussed health benefits of physical activity, and encouraged him to engage in regular exercise appropriate for his age and condition.  There are no diagnoses linked to this encounter.  No follow-ups on file.     Velva Harman, PA

## 2022-08-02 ENCOUNTER — Ambulatory Visit (INDEPENDENT_AMBULATORY_CARE_PROVIDER_SITE_OTHER): Payer: BC Managed Care – PPO | Admitting: Family Medicine

## 2022-08-02 ENCOUNTER — Encounter: Payer: Self-pay | Admitting: Family Medicine

## 2022-08-02 VITALS — BP 136/83 | HR 92 | Resp 18 | Ht 70.0 in | Wt 204.0 lb

## 2022-08-02 DIAGNOSIS — I5032 Chronic diastolic (congestive) heart failure: Secondary | ICD-10-CM

## 2022-08-02 DIAGNOSIS — E1159 Type 2 diabetes mellitus with other circulatory complications: Secondary | ICD-10-CM | POA: Diagnosis not present

## 2022-08-02 DIAGNOSIS — R1012 Left upper quadrant pain: Secondary | ICD-10-CM | POA: Diagnosis not present

## 2022-08-02 DIAGNOSIS — E1169 Type 2 diabetes mellitus with other specified complication: Secondary | ICD-10-CM | POA: Diagnosis not present

## 2022-08-02 DIAGNOSIS — E559 Vitamin D deficiency, unspecified: Secondary | ICD-10-CM

## 2022-08-02 DIAGNOSIS — E1122 Type 2 diabetes mellitus with diabetic chronic kidney disease: Secondary | ICD-10-CM | POA: Diagnosis not present

## 2022-08-02 DIAGNOSIS — M25562 Pain in left knee: Secondary | ICD-10-CM

## 2022-08-02 DIAGNOSIS — I152 Hypertension secondary to endocrine disorders: Secondary | ICD-10-CM | POA: Diagnosis not present

## 2022-08-02 DIAGNOSIS — Z1211 Encounter for screening for malignant neoplasm of colon: Secondary | ICD-10-CM

## 2022-08-02 DIAGNOSIS — E785 Hyperlipidemia, unspecified: Secondary | ICD-10-CM

## 2022-08-02 DIAGNOSIS — N182 Chronic kidney disease, stage 2 (mild): Secondary | ICD-10-CM

## 2022-08-02 DIAGNOSIS — Z1212 Encounter for screening for malignant neoplasm of rectum: Secondary | ICD-10-CM

## 2022-08-02 DIAGNOSIS — R252 Cramp and spasm: Secondary | ICD-10-CM

## 2022-08-02 DIAGNOSIS — C8280 Other types of follicular lymphoma, unspecified site: Secondary | ICD-10-CM

## 2022-08-02 NOTE — Assessment & Plan Note (Signed)
Followed by endocrinology.  Continue Farxiga 10 mg, metformin 500 mg, Prandin.  Will review endocrinology notes after next visit.  Continue ambulatory glucose monitoring.

## 2022-08-02 NOTE — Assessment & Plan Note (Signed)
Followed by oncology 

## 2022-08-02 NOTE — Assessment & Plan Note (Signed)
Last lipid panel: HDL 37, LDL 67.  Patient is not fasting today but does not want to take any more time off of work, so we will proceed with labs today since the difference between fasting and on fasting is not usually clinically significant.  Continue pravastatin 20 mg daily unless labs warrant change in therapy.

## 2022-08-02 NOTE — Assessment & Plan Note (Signed)
Stable.  Continue metoprolol tartrate 25 mg, valsartan-hydrochlorothiazide 320-12.5 mg.  Will collect CMP today for electrolytes and kidney function.  Will continue to monitor and change therapy as indicated by labs and blood pressure.

## 2022-08-02 NOTE — Assessment & Plan Note (Signed)
Followed by cardiology.  Next visit with cardiologist at the end of this month.

## 2022-08-03 ENCOUNTER — Other Ambulatory Visit: Payer: Self-pay | Admitting: Family Medicine

## 2022-08-03 DIAGNOSIS — E1169 Type 2 diabetes mellitus with other specified complication: Secondary | ICD-10-CM

## 2022-08-03 LAB — HEMOGLOBIN A1C
Est. average glucose Bld gHb Est-mCnc: 134 mg/dL
Hgb A1c MFr Bld: 6.3 % — ABNORMAL HIGH (ref 4.8–5.6)

## 2022-08-03 LAB — COMPREHENSIVE METABOLIC PANEL
ALT: 14 IU/L (ref 0–44)
AST: 13 IU/L (ref 0–40)
Albumin/Globulin Ratio: 2 (ref 1.2–2.2)
Albumin: 4.5 g/dL (ref 3.9–4.9)
Alkaline Phosphatase: 76 IU/L (ref 44–121)
BUN/Creatinine Ratio: 21 (ref 10–24)
BUN: 23 mg/dL (ref 8–27)
Bilirubin Total: 0.7 mg/dL (ref 0.0–1.2)
CO2: 22 mmol/L (ref 20–29)
Calcium: 10.1 mg/dL (ref 8.6–10.2)
Chloride: 103 mmol/L (ref 96–106)
Creatinine, Ser: 1.12 mg/dL (ref 0.76–1.27)
Globulin, Total: 2.2 g/dL (ref 1.5–4.5)
Glucose: 105 mg/dL — ABNORMAL HIGH (ref 70–99)
Potassium: 4.2 mmol/L (ref 3.5–5.2)
Sodium: 141 mmol/L (ref 134–144)
Total Protein: 6.7 g/dL (ref 6.0–8.5)
eGFR: 71 mL/min/{1.73_m2} (ref >59)

## 2022-08-03 LAB — CBC WITH DIFFERENTIAL/PLATELET
Basophils Absolute: 0.1 10*3/uL (ref 0.0–0.2)
Basos: 1 %
EOS (ABSOLUTE): 0.4 10*3/uL (ref 0.0–0.4)
Eos: 6 %
Hematocrit: 43.1 % (ref 37.5–51.0)
Hemoglobin: 15.1 g/dL (ref 13.0–17.7)
Immature Grans (Abs): 0 10*3/uL (ref 0.0–0.1)
Immature Granulocytes: 0 %
Lymphocytes Absolute: 2.3 10*3/uL (ref 0.7–3.1)
Lymphs: 29 %
MCH: 32.4 pg (ref 26.6–33.0)
MCHC: 35 g/dL (ref 31.5–35.7)
MCV: 93 fL (ref 79–97)
Monocytes Absolute: 0.8 10*3/uL (ref 0.1–0.9)
Monocytes: 10 %
Neutrophils Absolute: 4.3 10*3/uL (ref 1.4–7.0)
Neutrophils: 54 %
Platelets: 213 10*3/uL (ref 150–450)
RBC: 4.66 x10E6/uL (ref 4.14–5.80)
RDW: 13.5 % (ref 11.6–15.4)
WBC: 7.8 10*3/uL (ref 3.4–10.8)

## 2022-08-03 LAB — LIPID PANEL
Chol/HDL Ratio: 2.5 ratio (ref 0.0–5.0)
Cholesterol, Total: 99 mg/dL — ABNORMAL LOW (ref 100–199)
HDL: 40 mg/dL (ref >39)
LDL Chol Calc (NIH): 44 mg/dL (ref 0–99)
Triglycerides: 72 mg/dL (ref 0–149)
VLDL Cholesterol Cal: 15 mg/dL (ref 5–40)

## 2022-08-03 LAB — HM DIABETES EYE EXAM

## 2022-08-03 MED ORDER — PRAVASTATIN SODIUM 10 MG PO TABS: 10.0000 mg | ORAL_TABLET | Freq: Every day | ORAL | 1 refills | Status: DC

## 2022-08-04 ENCOUNTER — Other Ambulatory Visit: Payer: Self-pay | Admitting: Internal Medicine

## 2022-08-04 DIAGNOSIS — I480 Paroxysmal atrial fibrillation: Secondary | ICD-10-CM

## 2022-08-04 NOTE — Telephone Encounter (Signed)
Eliquis '5mg'$  refill request received. Patient is 72 years old, weight-92.5kg, Crea-1.12 on 08/02/22, Diagnosis-AFIB, and last seen by Dr. [name] on 03/30/21 & PENDING APPT ON 08/25/22. Dose is appropriate based on dosing criteria. Will send in refill to requested pharmacy.

## 2022-08-10 ENCOUNTER — Encounter: Payer: Self-pay | Admitting: Internal Medicine

## 2022-08-25 ENCOUNTER — Ambulatory Visit: Payer: BC Managed Care – PPO | Attending: Internal Medicine | Admitting: Internal Medicine

## 2022-08-25 ENCOUNTER — Encounter: Payer: Self-pay | Admitting: Internal Medicine

## 2022-08-25 VITALS — BP 110/60 | HR 82 | Ht 70.0 in | Wt 209.2 lb

## 2022-08-25 DIAGNOSIS — E1159 Type 2 diabetes mellitus with other circulatory complications: Secondary | ICD-10-CM | POA: Diagnosis not present

## 2022-08-25 DIAGNOSIS — I152 Hypertension secondary to endocrine disorders: Secondary | ICD-10-CM | POA: Diagnosis not present

## 2022-08-25 DIAGNOSIS — I5032 Chronic diastolic (congestive) heart failure: Secondary | ICD-10-CM | POA: Diagnosis not present

## 2022-08-25 DIAGNOSIS — I4819 Other persistent atrial fibrillation: Secondary | ICD-10-CM

## 2022-08-25 NOTE — Progress Notes (Signed)
HPI Tom Fuller returns today for followup. He is a pleasant 72 yo man with a h/o HTN, PAF, lymphoma, who has done well. In the interim he denies chest pain or sob. He does not have palpitations. He is still working and also taking care of his wife who has become in need of his care. Allergies  Allergen Reactions   Ampicillin Rash     Current Outpatient Medications  Medication Sig Dispense Refill   Accu-Chek FastClix Lancets MISC USE AS DIRECTED 102 each 1   apixaban (ELIQUIS) 5 MG TABS tablet Take 1 tablet (5 mg total) by mouth 2 (two) times daily. Please keep upcoming appointment. Thanks 60 tablet 1   FARXIGA 10 MG TABS tablet TAKE 1 TABLET BY MOUTH EVERY DAY 90 tablet 3   glucose blood (ACCU-CHEK GUIDE) test strip Use as instructed 100 strip 1   metFORMIN (GLUCOPHAGE-XR) 500 MG 24 hr tablet TAKE 1 TABLET (500 MG TOTAL) BY MOUTH 2 (TWO) TIMES DAILY AFTER A MEAL. 180 tablet 3   metoprolol tartrate (LOPRESSOR) 25 MG tablet TAKE 1 TABLET BY MOUTH TWICE A DAY 30 tablet 0   pravastatin (PRAVACHOL) 20 MG tablet Take 20 mg by mouth daily.     repaglinide (PRANDIN) 1 MG tablet TAKE 1 TABLET (1 MG TOTAL) BY MOUTH DAILY BEFORE SUPPER. 90 tablet 3   Semaglutide, 1 MG/DOSE, (OZEMPIC, 1 MG/DOSE,) 4 MG/3ML SOPN INJECT 0.75 MLS (1 MG TOTAL) INTO THE SKIN ONCE A WEEK. 9 mL 3   valsartan-hydrochlorothiazide (DIOVAN-HCT) 320-12.5 MG tablet TAKE 1 TABLET BY MOUTH EVERY DAY 90 tablet 0   No current facility-administered medications for this visit.     Past Medical History:  Diagnosis Date   Atrial fibrillation (HCC)    Cervical lymphadenopathy    Erectile dysfunction    GERD (gastroesophageal reflux disease)    Hypertension    Lymphoma, follicular (HCC)    Dr.Shadad   Retinal detachment     ROS:   All systems reviewed and negative except as noted in the HPI.   Past Surgical History:  Procedure Laterality Date   a bsc     ABSCESS DRAINAGE     under rt eye    CATARACT EXTRACTION  Right 05/2021   DEEP NECK LYMPH NODE BIOPSY / EXCISION  05/30/2009   VASECTOMY       Family History  Problem Relation Age of Onset   Hypertension Mother    Kidney disease Mother    Hypertension Father    Hypertension Brother    Diabetes Paternal Grandfather    Hypertension Paternal Grandfather    Kidney disease Maternal Aunt    Kidney disease Maternal Grandmother      Social History   Socioeconomic History   Marital status: Married    Spouse name: Not on file   Number of children: Not on file   Years of education: Not on file   Highest education level: Not on file  Occupational History   Not on file  Tobacco Use   Smoking status: Never   Smokeless tobacco: Never  Vaping Use   Vaping Use: Never used  Substance and Sexual Activity   Alcohol use: No   Drug use: No   Sexual activity: Yes    Partners: Female  Other Topics Concern   Not on file  Social History Narrative   Not on file   Social Determinants of Health   Financial Resource Strain: Not on file  Food Insecurity:  Not on file  Transportation Needs: Not on file  Physical Activity: Not on file  Stress: Not on file  Social Connections: Not on file  Intimate Partner Violence: Not on file     BP 110/60   Pulse 82   Ht 5\' 10"  (1.778 m)   Wt 209 lb 3.2 oz (94.9 kg)   SpO2 96%   BMI 30.02 kg/m   Physical Exam:  Well appearing NAD HEENT: Unremarkable Neck:  No JVD, no thyromegally Lymphatics:  No adenopathy Back:  No CVA tenderness Lungs:  Clear with no wheezes HEART:  IRegular rate rhythm, no murmurs, no rubs, no clicks Abd:  soft, positive bowel sounds, no organomegally, no rebound, no guarding Ext:  2 plus pulses, no edema, no cyanosis, no clubbing Skin:  No rashes no nodules Neuro:  CN II through XII intact, motor grossly intact  EKG - atrial fib with a controlled VR  Assess/Plan:  Persistent atrial fib - he is asymptomatic and his VR is well controlled.  2. HTN - his pressures have been  normal. He will continue his current meds. 3. Chronic diastolic heart failure - I encouraged the patient to avoid salty foods. He will maintain a low sodium diet. 4. obesity - he is encouraged to lose weight. I would like him under 200 lbs.   Carleene Overlie Teo Moede,MD

## 2022-08-25 NOTE — Patient Instructions (Signed)

## 2022-09-23 ENCOUNTER — Ambulatory Visit: Payer: BC Managed Care – PPO | Admitting: Oncology

## 2022-09-23 ENCOUNTER — Other Ambulatory Visit: Payer: BC Managed Care – PPO

## 2022-09-23 ENCOUNTER — Inpatient Hospital Stay: Payer: BC Managed Care – PPO | Attending: Hematology and Oncology

## 2022-09-23 ENCOUNTER — Other Ambulatory Visit: Payer: Self-pay

## 2022-09-23 ENCOUNTER — Inpatient Hospital Stay (HOSPITAL_BASED_OUTPATIENT_CLINIC_OR_DEPARTMENT_OTHER): Payer: BC Managed Care – PPO | Admitting: Hematology and Oncology

## 2022-09-23 ENCOUNTER — Other Ambulatory Visit: Payer: Self-pay | Admitting: Hematology and Oncology

## 2022-09-23 VITALS — BP 130/86 | HR 67 | Temp 97.8°F | Resp 16 | Wt 208.6 lb

## 2022-09-23 DIAGNOSIS — Z8572 Personal history of non-Hodgkin lymphomas: Secondary | ICD-10-CM | POA: Diagnosis present

## 2022-09-23 DIAGNOSIS — C8298 Follicular lymphoma, unspecified, lymph nodes of multiple sites: Secondary | ICD-10-CM

## 2022-09-23 LAB — CBC WITH DIFFERENTIAL (CANCER CENTER ONLY)
Abs Immature Granulocytes: 0.01 10*3/uL (ref 0.00–0.07)
Basophils Absolute: 0.1 10*3/uL (ref 0.0–0.1)
Basophils Relative: 1 %
Eosinophils Absolute: 0.4 10*3/uL (ref 0.0–0.5)
Eosinophils Relative: 6 %
HCT: 42 % (ref 39.0–52.0)
Hemoglobin: 14.5 g/dL (ref 13.0–17.0)
Immature Granulocytes: 0 %
Lymphocytes Relative: 31 %
Lymphs Abs: 2.3 10*3/uL (ref 0.7–4.0)
MCH: 31.7 pg (ref 26.0–34.0)
MCHC: 34.5 g/dL (ref 30.0–36.0)
MCV: 91.9 fL (ref 80.0–100.0)
Monocytes Absolute: 0.8 10*3/uL (ref 0.1–1.0)
Monocytes Relative: 11 %
Neutro Abs: 3.8 10*3/uL (ref 1.7–7.7)
Neutrophils Relative %: 51 %
Platelet Count: 209 10*3/uL (ref 150–400)
RBC: 4.57 MIL/uL (ref 4.22–5.81)
RDW: 13.4 % (ref 11.5–15.5)
WBC Count: 7.4 10*3/uL (ref 4.0–10.5)
nRBC: 0 % (ref 0.0–0.2)

## 2022-09-23 LAB — CMP (CANCER CENTER ONLY)
ALT: 11 U/L (ref 0–44)
AST: 15 U/L (ref 15–41)
Albumin: 4.4 g/dL (ref 3.5–5.0)
Alkaline Phosphatase: 57 U/L (ref 38–126)
Anion gap: 7 (ref 5–15)
BUN: 29 mg/dL — ABNORMAL HIGH (ref 8–23)
CO2: 28 mmol/L (ref 22–32)
Calcium: 10.3 mg/dL (ref 8.9–10.3)
Chloride: 103 mmol/L (ref 98–111)
Creatinine: 1.23 mg/dL (ref 0.61–1.24)
GFR, Estimated: >60 mL/min (ref >60)
Glucose, Bld: 116 mg/dL — ABNORMAL HIGH (ref 70–99)
Potassium: 3.9 mmol/L (ref 3.5–5.1)
Sodium: 138 mmol/L (ref 135–145)
Total Bilirubin: 0.8 mg/dL (ref 0.3–1.2)
Total Protein: 6.7 g/dL (ref 6.5–8.1)

## 2022-09-23 LAB — LACTATE DEHYDROGENASE: LDH: 112 U/L (ref 98–192)

## 2022-09-23 NOTE — Progress Notes (Signed)
[facility] [phone]   [phone]  PROGRESS NOTE  Patient Care Team: Melida Quitter, PA as PCP - General (Family Medicine) Marinus Maw, MD as Consulting Physician (Cardiology) Carlus Pavlov, MD as Consulting Physician (Endocrinology) Jaci Standard, MD as Consulting Physician (Hematology and Oncology)  Hematological/Oncological History # Follicular Lymphoma Stage IIIa 09/2008: diagnosed 08/25/2011: Bendamustine and rituximab for 6 cycles completed in 12/2011 and achieved complete response 09/21/2021: last visit with Dr. Clelia Croft 09/23/2022: establish care with Dr. Leonides Schanz   Interval History:  Tom Fuller 72 y.o. male with medical history significant for follicular lymphoma who presents for a follow up visit. The patient's last visit was on 09/21/2021. In the interim since the last visit he has had no major changes in his health.  On exam today Mr. [name] reports that he has been well overall in the interim since his last visit.  He reports that he is had no new lymph nodes and overall has been quite well.  He has not had any symptoms such as fevers, chills, sweats, nausea, vomiting or diarrhea.  He has had no recent infections.  His appetite has been good and he is hoping to lose weight by starting Ozempic.  He reports that he does not do routine checks for lymph nodes but will start.  Overall he is at his baseline level of health with no questions comments or concerns today.  A full 10 point ROS is otherwise negative.  MEDICAL HISTORY:  Past Medical History:  Diagnosis Date   Atrial fibrillation (HCC)    Cervical lymphadenopathy    Erectile dysfunction    GERD (gastroesophageal reflux disease)    Hypertension    Lymphoma, follicular (HCC)    Dr.Shadad   Retinal detachment     SURGICAL HISTORY: Past Surgical History:  Procedure Laterality Date   a bsc     ABSCESS DRAINAGE     under rt eye    CATARACT EXTRACTION Right 05/2021    DEEP NECK LYMPH NODE BIOPSY / EXCISION  05/30/2009   VASECTOMY      SOCIAL HISTORY: Social History   Socioeconomic History   Marital status: Married    Spouse name: Not on file   Number of children: Not on file   Years of education: Not on file   Highest education level: Not on file  Occupational History   Not on file  Tobacco Use   Smoking status: Never   Smokeless tobacco: Never  Vaping Use   Vaping Use: Never used  Substance and Sexual Activity   Alcohol use: No   Drug use: No   Sexual activity: Yes    Partners: Female  Other Topics Concern   Not on file  Social History Narrative   Not on file   Social Determinants of Health   Financial Resource Strain: Not on file  Food Insecurity: Not on file  Transportation Needs: Not on file  Physical Activity: Not on file  Stress: Not on file  Social Connections: Not on file  Intimate Partner Violence: Not on file    FAMILY HISTORY: Family History  Problem Relation Age of Onset   Hypertension Mother    Kidney disease Mother    Hypertension Father    Hypertension Brother    Diabetes Paternal Grandfather    Hypertension Paternal Grandfather    Kidney disease Maternal Aunt    Kidney disease Maternal Grandmother     ALLERGIES:  is allergic to ampicillin.  MEDICATIONS:  Current Outpatient Medications  Medication Sig Dispense Refill   Accu-Chek FastClix Lancets MISC USE AS DIRECTED 102 each 1   apixaban (ELIQUIS) 5 MG TABS tablet Take 1 tablet (5 mg total) by mouth 2 (two) times daily. Please keep upcoming appointment. Thanks 60 tablet 1   FARXIGA 10 MG TABS tablet TAKE 1 TABLET BY MOUTH EVERY DAY 90 tablet 3   glucose blood (ACCU-CHEK GUIDE) test strip Use as instructed 100 strip 1   metFORMIN (GLUCOPHAGE-XR) 500 MG 24 hr tablet TAKE 1 TABLET (500 MG TOTAL) BY MOUTH 2 (TWO) TIMES DAILY AFTER A MEAL. 180 tablet 3   metoprolol tartrate (LOPRESSOR) 25 MG tablet TAKE 1 TABLET BY MOUTH TWICE A DAY 30 tablet 0    pravastatin (PRAVACHOL) 20 MG tablet Take 20 mg by mouth daily.     repaglinide (PRANDIN) 1 MG tablet TAKE 1 TABLET (1 MG TOTAL) BY MOUTH DAILY BEFORE SUPPER. 90 tablet 3   Semaglutide, 1 MG/DOSE, (OZEMPIC, 1 MG/DOSE,) 4 MG/3ML SOPN INJECT 0.75 MLS (1 MG TOTAL) INTO THE SKIN ONCE A WEEK. 9 mL 3   valsartan-hydrochlorothiazide (DIOVAN-HCT) 320-12.5 MG tablet TAKE 1 TABLET BY MOUTH EVERY DAY 90 tablet 0   No current facility-administered medications for this visit.    REVIEW OF SYSTEMS:   Constitutional: ( - ) fevers, ( - )  chills , ( - ) night sweats Eyes: ( - ) blurriness of vision, ( - ) double vision, ( - ) watery eyes Ears, nose, mouth, throat, and face: ( - ) mucositis, ( - ) sore throat Respiratory: ( - ) cough, ( - ) dyspnea, ( - ) wheezes Cardiovascular: ( - ) palpitation, ( - ) chest discomfort, ( - ) lower extremity swelling Gastrointestinal:  ( - ) nausea, ( - ) heartburn, ( - ) change in bowel habits Skin: ( - ) abnormal skin rashes Lymphatics: ( - ) new lymphadenopathy, ( - ) easy bruising Neurological: ( - ) numbness, ( - ) tingling, ( - ) new weaknesses Behavioral/Psych: ( - ) mood change, ( - ) new changes  All other systems were reviewed with the patient and are negative.  PHYSICAL EXAMINATION:  There were no vitals filed for this visit. There were no vitals filed for this visit.  GENERAL: Well-appearing elderly Caucasian male, alert, no distress and comfortable SKIN: skin color, texture, turgor are normal, no rashes or significant lesions LYMPH: No adenopathy noted in the cervical, supraclavicular, or axillary lymph nodes EYES: conjunctiva are pink and non-injected, sclera clear LUNGS: clear to auscultation and percussion with normal breathing effort HEART: regular rate & rhythm and no murmurs and no lower extremity edema Musculoskeletal: no cyanosis of digits and no clubbing  PSYCH: alert & oriented x 3, fluent speech NEURO: no focal motor/sensory  deficits  LABORATORY DATA:  I have reviewed the data as listed    Latest Ref Rng & Units 08/02/2022    1:45 PM 09/21/2021   10:03 AM 01/26/2021   12:55 PM  CBC  WBC 3.4 - 10.8 x10E3/uL 7.8  7.9  8.6   Hemoglobin 13.0 - 17.7 g/dL 16.1  09.6  04.5   Hematocrit 37.5 - 51.0 % 43.1  44.5  41.6   Platelets 150 - 450 x10E3/uL 213  248  216        Latest Ref Rng & Units 08/02/2022    1:45 PM 09/21/2021   10:03 AM 01/26/2021   12:55 PM  CMP  Glucose 70 -  99 mg/dL 409  811  99   BUN 8 - 27 mg/dL 23  25  25    Creatinine 0.76 - 1.27 mg/dL 9.14  7.82  9.56   Sodium 134 - 144 mmol/L 141  141  138   Potassium 3.5 - 5.2 mmol/L 4.2  4.2  4.0   Chloride 96 - 106 mmol/L 103  103  103   CO2 20 - 29 mmol/L 22  32  27   Calcium 8.6 - 10.2 mg/dL 21.3  9.9  08.6   Total Protein 6.0 - 8.5 g/dL 6.7  6.7  6.9   Total Bilirubin 0.0 - 1.2 mg/dL 0.7  1.0  0.9   Alkaline Phos 44 - 121 IU/L 76  53  64   AST 0 - 40 IU/L 13  13  15    ALT 0 - 44 IU/L 14  11  15      No results found for: "MPROTEIN" Lab Results  Component Value Date   KPAFRELGTCHN 0.64 08/15/2008   LAMBDASER 1.90 08/15/2008   KAPLAMBRATIO 0.34 08/15/2008    RADIOGRAPHIC STUDIES: No results found.  ASSESSMENT & PLAN Tom Fuller 72 y.o. male with medical history significant for follicular lymphoma who presents for a follow up visit.  After review the labs, review the records, discussion with the patient the findings are most consistent with a follicular lymphoma currently in remission.  He was initially a stage IIIa treated with Bendamustine and rituximab in 2013.  He has not required any treatment since that time.  He is had no new signs or symptoms of recurrent disease.  At this time would recommend once yearly observation as these indolent lymphomas may still recur.  The patient voices understanding of his disease process and the plan moving forward.  # Follicular Lymphoma Stage IIIa -- Patient is currently over 10 years out from his  chemotherapy treatment. -- No need for routine imaging at this time. -- Labs today show white blood cell count 7.4, hemoglobin 14.5, MCV 91.9, and platelets of 209 -- Patient does not have any signs or symptoms concerning for recurrent lymphoma -- Plan to have the patient return to clinic in 1 years time unless you develop any new or worsening symptoms.  No orders of the defined types were placed in this encounter.   All questions were answered. The patient knows to call the clinic with any problems, questions or concerns.  A total of more than 40 minutes were spent on this encounter with face-to-face time and non-face-to-face time, including preparing to see the patient, ordering tests and/or medications, counseling the patient and coordination of care as outlined above.   Ulysees Barns, MD Department of Hematology/Oncology [facility] at [facility] Phone: [phone] Pager: [phone] Email: [email or web address]  09/23/2022 7:32 AM

## 2022-10-13 ENCOUNTER — Other Ambulatory Visit: Payer: Self-pay | Admitting: *Deleted

## 2022-10-13 MED ORDER — METOPROLOL TARTRATE 25 MG PO TABS: 25.0000 mg | ORAL_TABLET | Freq: Two times a day (BID) | ORAL | 3 refills | Status: DC

## 2022-10-14 ENCOUNTER — Ambulatory Visit: Payer: BC Managed Care – PPO | Admitting: Internal Medicine

## 2022-10-18 ENCOUNTER — Ambulatory Visit: Payer: BC Managed Care – PPO | Admitting: Internal Medicine

## 2022-10-18 ENCOUNTER — Encounter: Payer: Self-pay | Admitting: Internal Medicine

## 2022-10-18 VITALS — BP 128/82 | HR 85 | Ht 70.0 in | Wt 205.8 lb

## 2022-10-18 DIAGNOSIS — Z7985 Long-term (current) use of injectable non-insulin antidiabetic drugs: Secondary | ICD-10-CM | POA: Diagnosis not present

## 2022-10-18 DIAGNOSIS — E1122 Type 2 diabetes mellitus with diabetic chronic kidney disease: Secondary | ICD-10-CM | POA: Diagnosis not present

## 2022-10-18 DIAGNOSIS — E669 Obesity, unspecified: Secondary | ICD-10-CM

## 2022-10-18 DIAGNOSIS — Z794 Long term (current) use of insulin: Secondary | ICD-10-CM

## 2022-10-18 DIAGNOSIS — Z7984 Long term (current) use of oral hypoglycemic drugs: Secondary | ICD-10-CM

## 2022-10-18 DIAGNOSIS — E785 Hyperlipidemia, unspecified: Secondary | ICD-10-CM

## 2022-10-18 DIAGNOSIS — N189 Chronic kidney disease, unspecified: Secondary | ICD-10-CM | POA: Diagnosis not present

## 2022-10-18 DIAGNOSIS — E119 Type 2 diabetes mellitus without complications: Secondary | ICD-10-CM

## 2022-10-18 LAB — POCT GLYCOSYLATED HEMOGLOBIN (HGB A1C): Hemoglobin A1C: 5.9 % — AB (ref 4.0–5.6)

## 2022-10-18 MED ORDER — OZEMPIC (1 MG/DOSE) 4 MG/3ML ~~LOC~~ SOPN: PEN_INJECTOR | SUBCUTANEOUS | 3 refills | Status: DC

## 2022-10-18 MED ORDER — METFORMIN HCL ER 500 MG PO TB24: ORAL_TABLET | ORAL | 3 refills | Status: DC

## 2022-10-18 MED ORDER — DAPAGLIFLOZIN PROPANEDIOL 10 MG PO TABS: 10.0000 mg | ORAL_TABLET | Freq: Every day | ORAL | 3 refills | Status: DC

## 2022-10-18 NOTE — Progress Notes (Signed)
Patient ID: Tom Fuller, male   DOB: 25-Jan-1951, 72 y.o.   MRN: [id number]   HPI: Tom Fuller is a 72 y.o.-year-old male, presenting for follow-up for DM2, dx in 2017, non-insulin-dependent, uncontrolled, with complications (mild CKD, ED).  Last visit 4 months ago.  Interim history: No increased urination, no nausea, no chest pain. + blurry vision R eye (detached retina and cataract sx). He has been with his wife who has been sick with diverticulitis in and out of the hospital.  Reviewed HbA1c levels: Lab Results  Component Value Date   HGBA1C 6.3 (H) 08/02/2022   HGBA1C 6.8 (A) 06/13/2022   HGBA1C 6.5 (A) 02/11/2022   HGBA1C 6.1 (A) 10/01/2021   HGBA1C 6.3 (A) 05/28/2021   HGBA1C 6.6 (H) 12/01/2020   HGBA1C 6.1 (A) 09/24/2020   HGBA1C 6.9 (A) 05/26/2020   HGBA1C 6.9 (H) 09/25/2019   HGBA1C 5.9 (A) 08/16/2019   Pt is on a regimen of: - Metformin ER -started 06/2018 >> 500 mg 2x a day with meals >> 1000 mg with dinner >> 500 mg with dinner and 500 mg at bedtime >> 1000 mg with lunch - Farxiga 10 mg before b'fast - Prandin 1 mg before dinner >> before a larger dinner >> not taking >> restarted with dinner - Ozempic 0.5 mg weekly in am  - started 02/2018 >> mild constipation >> 1 mg weekly-increased 11/2019 He was on Glipizide 5 mg 2x a day before meals -started 01/2019 >> stopped after adding Ozempic He stopped regular metformin due to abdominal pain  Pt checks his sugars once a day: - am: 97-133 >> 116-156, 186 >> 113-159, 186, 193 >> 93-12, 132 - 2h after b'fast: n/c >> 119 >> n/c - before lunch: n/c >> 119 >> 123 >> n/c - 2h after lunch:  141 >> n/c >> 106-119, 144 >> n/c - before dinner: 101-125 >> 111-152 >> 96-140, 164 >> 93-125, 138, 145 - 2h after dinner: 93-124 >> 61, 85-151 >> 154 >> 114-192 >> 99-114 - bedtime:  140 >> n/c >> 114, 139, 146 >> 134, 195 >> n/c - nighttime: n/c Lowest sugar was 220 >>... 65 >> 61 >> 111 >> 96 >> 93; he has hypoglycemia awareness in the  70s. Highest sugar was 346 >>...  179 >> 186>> 193 >> 145.  Glucometer: Accu-Chek  Pt's meals are: - Breakfast: cereal (rice crispies) with milk, boiled eggs - Lunch: banana sandwich, whole wheat - Dinner: varies - Snacks: 2-3x Despite repeated advice about stopping orange juice (with b'fast - 12 oz) and milk (with dinner - 12 oz), he continues on these, but decreased the amount.  -+ Mild CKD, last BUN/creatinine:  Lab Results  Component Value Date   BUN 29 (H) 09/23/2022   BUN 23 08/02/2022   CREATININE 1.23 09/23/2022   CREATININE 1.12 08/02/2022  On valsartan 320.  -+ HL; last set of lipids: Lab Results  Component Value Date   CHOL 99 (L) 08/02/2022   HDL 40 08/02/2022   LDLCALC 44 08/02/2022   TRIG 72 08/02/2022   CHOLHDL 2.5 08/02/2022  He was not on a statin >> we started Pravastatin 20 mg daily. He takes this now.  - last eye exam was in 08/03/2022: No DR, + cataract.  Before last visit he had an episode of diplopia, resolved. He had retinal detachment sx 03/2021. He had cataract sx. In 2023.  - no numbness and tingling in his feet.  Last foot exam 02/11/2022.  Pt  has FH of DM in PGF.  ROS: + see HPI  I reviewed pt's medications, allergies, PMH, social hx, family hx, and changes were documented in the history of present illness. Otherwise, unchanged from my initial visit note.  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 Please keep upcoming appointment. Thanks 60 tablet 1   FARXIGA 10 MG TABS tablet TAKE 1 TABLET BY MOUTH EVERY DAY 90 tablet 3   glucose blood (ACCU-CHEK GUIDE) test strip Use as instructed 100 strip 1   metFORMIN (GLUCOPHAGE-XR) 500 MG 24 hr tablet TAKE 1 TABLET (500 MG TOTAL) BY MOUTH 2 (TWO) TIMES DAILY AFTER A MEAL. 180 tablet 3   metoprolol tartrate (LOPRESSOR) 25 MG tablet Take 1 tablet (25 mg total) by mouth 2 (two) times daily. 180 tablet 3   pravastatin (PRAVACHOL) 20 MG tablet Take 20 mg by mouth daily.     repaglinide (PRANDIN) 1 MG tablet TAKE 1 TABLET (1 MG TOTAL) BY MOUTH DAILY BEFORE SUPPER. 90 tablet 3   Semaglutide, 1 MG/DOSE, (OZEMPIC, 1 MG/DOSE,) 4 MG/3ML SOPN INJECT 0.75 MLS (1 MG TOTAL) INTO THE SKIN ONCE A WEEK. 9 mL 3   valsartan-hydrochlorothiazide (DIOVAN-HCT) 320-12.5 MG tablet TAKE 1 TABLET BY MOUTH EVERY DAY 90 tablet 0   No current facility-administered medications on file prior to visit.   Allergies  Allergen Reactions   Ampicillin Rash   Family History  Problem Relation Age of Onset   Hypertension Mother    Kidney disease Mother    Hypertension Father    Hypertension Brother    Diabetes Paternal Grandfather    Hypertension Paternal Grandfather    Kidney disease Maternal Aunt    Kidney disease Maternal Grandmother    PE: BP 128/82 (BP Location: Right Arm, Patient Position: Sitting, Cuff Size: Normal)   Pulse 85   Ht 5\' 10"  (1.778 m)   Wt 205 lb 12.8 oz (93.4 kg)   SpO2 98%   BMI 29.53 kg/m  Wt  Readings from Last 3 Encounters:  10/18/22 205 lb 12.8 oz (93.4 kg)  09/23/22 208 lb 9.6 oz (94.6 kg)  08/25/22 209 lb 3.2 oz (94.9 kg)   Constitutional: overweight, in NAD Eyes:  EOMI, no exophthalmos ENT: no neck masses, no cervical lymphadenopathy Cardiovascular:  RRR, No MRG, L leg slightly more edematous Respiratory: CTA B Musculoskeletal: no deformities Skin:no rashes Neurological: no tremor with outstretched hands  ASSESSMENT: 1. DM2, non-insulin-dependent, now controlled, with long-term complications - mild CKD - ED  2. HL  3.  Obesity class I  PLAN:  1. Patient with longstanding, previously uncontrolled type 2 diabetes, with improved control in the last 3.5 years.  He is on an oral medication regimen with metformin, meglitinide, SGLT2 inhibitor and also weekly GLP-1 receptor agonist.  At last visit, sugars were at goal, but with more hyperglycemic spikes after having had larger dinner as the night before.  I advised him to take Prandin before dinner, but otherwise we continued the same regimen.  HbA1c was slightly higher, at 6.8, but since then, he had another HbA1c that was lower, at 6.3% 2.5 months ago. -At today's visit, sugars appear to be improved in the morning, now almost all at goal.  Later in the day, sugars are also mostly at goal, with particularly good sugars after dinner, sometimes lower than before dinner.  Therefore, we will go ahead and stop the Prandin.  We can continue the rest of the regimen.  I refilled his prescriptions. - I suggested to:  Patient Instructions  Please continue: - Metformin ER 1000 mg with lunch - Farxiga 10 mg before breakfast - Ozempic 1 mg weekly in am   For now, stop Prandin.   Please return in 4 months with your sugar log.    - we checked his HbA1c: 5.9% (A1 lower) - advised to check sugars at different times of the day - 2x a day, rotating check times - advised for yearly eye exams >> he is UTD - return to clinic in 4  months  2. HL -Reviewed lipid panel from 07/2022: Excellent fractions: Lab Results  Component Value Date   CHOL 99 (L) 08/02/2022   HDL 40 08/02/2022   LDLCALC 44 08/02/2022   TRIG 72 08/02/2022   CHOLHDL 2.5 08/02/2022  -He continues pravastatin 20 mg daily without side effects  3.  Obesity class I -Continue SGLT 2 inhibitor and GLP-1 receptor agonist which should also help with weight loss -he gained 3 pounds before last visit -Lost 3 pounds since then  Carlus Pavlov, MD PhD [facility] Endocrinology

## 2022-10-18 NOTE — Patient Instructions (Addendum)
Please continue: - Metformin ER 1000 mg with lunch - Farxiga 10 mg before breakfast - Ozempic 1 mg weekly in am   For now, stop Prandin.   Please return in 4 months with your sugar log.

## 2022-11-06 ENCOUNTER — Other Ambulatory Visit: Payer: Self-pay | Admitting: Internal Medicine

## 2022-11-06 ENCOUNTER — Other Ambulatory Visit: Payer: Self-pay | Admitting: Family Medicine

## 2022-11-06 DIAGNOSIS — E1169 Type 2 diabetes mellitus with other specified complication: Secondary | ICD-10-CM

## 2022-11-06 DIAGNOSIS — I480 Paroxysmal atrial fibrillation: Secondary | ICD-10-CM

## 2022-11-07 NOTE — Telephone Encounter (Signed)
Prescription refill request for Eliquis received. Indication:afib Last office visit:3/24 Scr:1.23  4/24 Age: 72 Weight:93.4  kg  Prescription refilled

## 2022-11-09 ENCOUNTER — Telehealth: Payer: Self-pay

## 2022-11-09 NOTE — Telephone Encounter (Signed)
I called and spoke with the patient he states that he has not been checking it after meals but he checks it before meals and it runs 117-140

## 2022-11-09 NOTE — Telephone Encounter (Signed)
Patient called stating that since stopping the Repaglinide at supper his blood sugars in the morning have been fluctuating from 106-166. He wants to know what to do? Please advise

## 2022-11-10 NOTE — Telephone Encounter (Signed)
MyChart message sent to pt

## 2022-11-12 ENCOUNTER — Other Ambulatory Visit: Payer: Self-pay | Admitting: Nurse Practitioner

## 2022-11-12 DIAGNOSIS — E1159 Type 2 diabetes mellitus with other circulatory complications: Secondary | ICD-10-CM

## 2022-11-22 LAB — HM DIABETES EYE EXAM

## 2022-11-23 ENCOUNTER — Encounter: Payer: Self-pay | Admitting: Family Medicine

## 2022-11-27 ENCOUNTER — Other Ambulatory Visit: Payer: Self-pay | Admitting: Nurse Practitioner

## 2022-11-27 DIAGNOSIS — I152 Hypertension secondary to endocrine disorders: Secondary | ICD-10-CM

## 2022-12-05 ENCOUNTER — Ambulatory Visit: Payer: BC Managed Care – PPO | Admitting: Family Medicine

## 2022-12-29 ENCOUNTER — Encounter: Payer: Self-pay | Admitting: Family Medicine

## 2022-12-29 ENCOUNTER — Ambulatory Visit: Payer: BC Managed Care – PPO | Admitting: Family Medicine

## 2022-12-29 VITALS — BP 102/70 | HR 83 | Ht 70.0 in | Wt 203.1 lb

## 2022-12-29 DIAGNOSIS — Z636 Dependent relative needing care at home: Secondary | ICD-10-CM

## 2022-12-29 DIAGNOSIS — R1312 Dysphagia, oropharyngeal phase: Secondary | ICD-10-CM

## 2022-12-29 NOTE — Patient Instructions (Signed)
It was nice to see you today,  We addressed the following topics today: - no changes to your medications - if you have any more difficulty swallowing let us know. I can send in a referral to a throat specialist.    Have a great day,  Frederic Jericho, MD

## 2022-12-29 NOTE — Progress Notes (Unsigned)
   Established Patient Office Visit  Subjective   Patient ID: Tom Fuller, male    DOB: December 01, 1950  Age: 72 y.o. MRN: [id number]  No chief complaint on file.   HPI  DM2-Farxiga, metformin, Prandin-prescribed by [facility]  HTN-metoprolol, valsartan-HCTZ.  No concerns of this medication.  HLD-pravastatin.  Tolerating well.  No concerns.  Irritation -since June of last year his wife has been having issues with abdominal complaints.  All workup has been negative.  Patient also believes that she may be experiencing signs of forgetfulness or depression.  States she constantly is calling his work to check on him and see when he is coming home.  She also lays in bed and watches TV all day even though she has the ability to walk and ambulate without difficulty.  He states this has been hard on him emotionally.  He is not interested in medications and is not interested in talking to a therapist.  If anybody, he would talk to family members.  Dysphagia-last week the patient was eating [place] food, pork fried rice, and when he swallowed a bite he had trouble getting it down.  It felt like he got "stuck" in his throat.  It eventually went down.  He had an issue with this a long time ago and had an endoscopy.  Since that he had never had issues again until this time.  We discussed ear nose and throat referral if needed.  Patient wishes to defer at this time.   The ASCVD Risk score (Arnett DK, et al., 2019) failed to calculate for the following reasons:   The valid total cholesterol range is 130 to 320 mg/dL   {History (WUJWJXBJ):47829}  ROS    Objective:     There were no vitals taken for this visit. {Vitals History (Optional):23777}  Physical Exam General: Alert and oriented Pulmonary: No respiratory distress Psych: Pleasant affect.  Does become slightly emotional at times when talking about stress with his wife.   No results found for any visits on 12/29/22.  {Labs  (Optional):23779}      Assessment & Plan:   There are no diagnoses linked to this encounter.   No follow-ups on file.    Sandre Kitty, MD

## 2022-12-31 DIAGNOSIS — Z636 Dependent relative needing care at home: Secondary | ICD-10-CM | POA: Insufficient documentation

## 2022-12-31 DIAGNOSIS — R1312 Dysphagia, oropharyngeal phase: Secondary | ICD-10-CM | POA: Insufficient documentation

## 2022-12-31 NOTE — Assessment & Plan Note (Signed)
1 instance of feeling like food was stuck in his throat 1 week ago.  Has had similar issues several years ago.  Advised patient if it continues to occur he would benefit from referral for further evaluation.  Likely ENT referral based on location of the dysphagia symptoms which sound more laryngeal than esophageal.  Could consider GI referral as well for endoscopy.

## 2022-12-31 NOTE — Assessment & Plan Note (Signed)
Patient's wife has health issues which have led to her relying on patient to help her function.  He feels like she may have some depression as well and some dementia.  States she is forgetful, calls him at work several times a day.  This appears to be wearing on the patient emotionally although he does not want to talk to therapy or try medications to help with his stress.  He accompanies his wife to all her appointments since she does not drive.  I encouraged him to voice these concerns with her PCP the next time he brings her to her appointment.

## 2023-01-09 NOTE — Progress Notes (Unsigned)
   Acute Office Visit  Subjective:     Patient ID: Tom Fuller, male    DOB: 02-02-1951, 72 y.o.   MRN: [id number]  No chief complaint on file.   HPI Patient is in today for blister on foot  ROS      Objective:    There were no vitals taken for this visit. {Vitals History (Optional):23777}  Physical Exam  No results found for any visits on 01/10/23.      Assessment & Plan:   There are no diagnoses linked to this encounter.   No follow-ups on file.  Sandre Kitty, MD

## 2023-01-10 ENCOUNTER — Encounter: Payer: Self-pay | Admitting: Family Medicine

## 2023-01-10 ENCOUNTER — Ambulatory Visit: Payer: BC Managed Care – PPO | Admitting: Family Medicine

## 2023-01-10 VITALS — BP 125/86 | HR 79 | Resp 18 | Ht 70.0 in | Wt 201.0 lb

## 2023-01-10 DIAGNOSIS — N182 Chronic kidney disease, stage 2 (mild): Secondary | ICD-10-CM | POA: Diagnosis not present

## 2023-01-10 DIAGNOSIS — E11621 Type 2 diabetes mellitus with foot ulcer: Secondary | ICD-10-CM | POA: Diagnosis not present

## 2023-01-10 DIAGNOSIS — E1122 Type 2 diabetes mellitus with diabetic chronic kidney disease: Secondary | ICD-10-CM

## 2023-01-10 DIAGNOSIS — L97529 Non-pressure chronic ulcer of other part of left foot with unspecified severity: Secondary | ICD-10-CM | POA: Diagnosis not present

## 2023-01-10 DIAGNOSIS — Z7984 Long term (current) use of oral hypoglycemic drugs: Secondary | ICD-10-CM

## 2023-01-10 LAB — POCT GLYCOSYLATED HEMOGLOBIN (HGB A1C): HbA1c POC (<> result, manual entry): 7.4 % (ref 4.0–5.6)

## 2023-01-10 MED ORDER — MUPIROCIN 2 % EX OINT: 1.0000 | TOPICAL_OINTMENT | Freq: Every day | CUTANEOUS | 1 refills | Status: DC

## 2023-01-10 NOTE — Assessment & Plan Note (Signed)
Likely due to irritation from his footwear which he is required to wear for work. - Mupirocin applied daily, covered with 4 x 4 gauze. - Ambulatory referral to podiatry, advised patient to call their office to try and schedule something sooner if able to - 1 month follow-up with me

## 2023-01-10 NOTE — Patient Instructions (Signed)
It was nice to see you today,  We addressed the following topics today: -I would like you to use the mupirocin ointment I prescribed and 4 x 4 gauze to apply daily or sooner if necessary.  Apply the ointment in the morning, placed the gauze over top of it and then place your sock over your foot.  Wash with soap and water.  I would avoid soaking the foot. - I would like you to call the podiatrist [phone] to see if you can schedule appointment I will also send in referral but it may be quicker if you call first. - I would like you to follow-up with me in 1 month  Have a great day,  Frederic Jericho, MD

## 2023-01-12 ENCOUNTER — Ambulatory Visit: Payer: BC Managed Care – PPO

## 2023-01-12 ENCOUNTER — Ambulatory Visit: Payer: BC Managed Care – PPO | Admitting: Podiatry

## 2023-01-12 DIAGNOSIS — L089 Local infection of the skin and subcutaneous tissue, unspecified: Secondary | ICD-10-CM | POA: Diagnosis not present

## 2023-01-12 DIAGNOSIS — S90822A Blister (nonthermal), left foot, initial encounter: Secondary | ICD-10-CM

## 2023-01-12 MED ORDER — DOXYCYCLINE HYCLATE 100 MG PO TABS: 100.0000 mg | ORAL_TABLET | Freq: Two times a day (BID) | ORAL | 0 refills | Status: DC

## 2023-01-12 NOTE — Progress Notes (Signed)
Subjective:   Patient ID: Tom Fuller, male   DOB: 72 y.o.   MRN: [id number]   HPI Chief Complaint  Patient presents with   Blister    Left hallux toe blister  Pt stated that he wears steele toe boots and he took them off one day and he had a place on his foot    72 year old male presents the office with above concerns.  He can use mupirocin ointment on the area blister daily.  Thinks is getting better.  Does not report any fevers or chills.  No purulence.   Review of Systems  All other systems reviewed and are negative.  Past Medical History:  Diagnosis Date   Atrial fibrillation (HCC)    Cervical lymphadenopathy    Erectile dysfunction    GERD (gastroesophageal reflux disease)    Hypertension    Lymphoma, follicular (HCC)    Dr.Shadad   Retinal detachment     Past Surgical History:  Procedure Laterality Date   a bsc     ABSCESS DRAINAGE     under rt eye    CATARACT EXTRACTION Right 05/2021   DEEP NECK LYMPH NODE BIOPSY / EXCISION  05/30/2009   VASECTOMY       Current Outpatient Medications:    doxycycline (VIBRA-TABS) 100 MG tablet, Take 1 tablet (100 mg total) by mouth 2 (two) times daily., Disp: 14 tablet, Rfl: 0   Accu-Chek FastClix Lancets MISC, USE AS DIRECTED, Disp: 102 each, Rfl: 1   apixaban (ELIQUIS) 5 MG TABS tablet, Take 1 tablet (5 mg total) by mouth 2 (two) times daily., Disp: 60 tablet, Rfl: 5   dapagliflozin propanediol (FARXIGA) 10 MG TABS tablet, Take 1 tablet (10 mg total) by mouth daily., Disp: 90 tablet, Rfl: 3   glucose blood (ACCU-CHEK GUIDE) test strip, Use as instructed, Disp: 100 strip, Rfl: 1   metFORMIN (GLUCOPHAGE-XR) 500 MG 24 hr tablet, TAKE 2 tablets by mouth with a meal, Disp: 180 tablet, Rfl: 3   metoprolol tartrate (LOPRESSOR) 25 MG tablet, Take 1 tablet (25 mg total) by mouth 2 (two) times daily., Disp: 180 tablet, Rfl: 3   mupirocin ointment (BACTROBAN) 2 %, Apply 1 Application topically daily., Disp: 30 g, Rfl: 1   pravastatin  (PRAVACHOL) 10 MG tablet, TAKE 1 TABLET BY MOUTH EVERY DAY, Disp: 90 tablet, Rfl: 0   pravastatin (PRAVACHOL) 20 MG tablet, Take 20 mg by mouth daily., Disp: , Rfl:    Semaglutide, 1 MG/DOSE, (OZEMPIC, 1 MG/DOSE,) 4 MG/3ML SOPN, INJECT 1 ML (1 MG TOTAL) INTO THE SKIN ONCE A WEEK., Disp: 9 mL, Rfl: 3   valsartan-hydrochlorothiazide (DIOVAN-HCT) 320-12.5 MG tablet, TAKE 1 TABLET BY MOUTH EVERY DAY, Disp: 90 tablet, Rfl: 0  Allergies  Allergen Reactions   Ampicillin Rash           Objective:  Physical Exam  General: AAO x3, NAD  Dermatological: Deroofed blister present the left hallux as pictured below.  There is localized edema mild surrounding erythema approximately.  There is no ascending cellulitis.  No fluctuation or crepitation present.    Vascular: Dorsalis Pedis artery and Posterior Tibial artery pedal pulses are palpable bilateral with immedate capillary fill time. . There is no pain with calf compression, swelling, warmth, erythema.   Neruologic: Sensation decreased.  Musculoskeletal: No pain on exam      Assessment:   Blister left foot     Plan:  -Treatment options discussed including all alternatives, risks, and complications -Etiology of symptoms  were discussed -X-rays were obtained and reviewed with the patient.  3 views of the foot were obtained.  No evidence of acute fracture or cortical destruction suggest osteomyelitis.  No purulence or changes present.  Heel spur present.  Mild spurring present present 1st MTPJ.  -Continue mupirocin ointment dressing changes daily.  Prescribed doxycycline.  Surgical shoe for offloading. -Vascular: Has palpable pulses, if no improvement next appointment order ABI.  -Monitor for any clinical signs or symptoms of infection and directed to call the office immediately should any occur or go to the ER.  No follow-ups on file.  Vivi Barrack DPM     -Doxycyline -Surgical shoe -Continue mup ooitnment

## 2023-01-12 NOTE — Patient Instructions (Signed)
Monitor for any signs/symptoms of infection. Call the office immediately if any occur or go directly to the emergency room. Call with any questions/concerns.  

## 2023-01-13 ENCOUNTER — Other Ambulatory Visit: Payer: Self-pay | Admitting: Nurse Practitioner

## 2023-01-13 DIAGNOSIS — E1159 Type 2 diabetes mellitus with other circulatory complications: Secondary | ICD-10-CM

## 2023-01-19 ENCOUNTER — Ambulatory Visit: Payer: BC Managed Care – PPO | Admitting: Podiatry

## 2023-01-31 ENCOUNTER — Ambulatory Visit: Payer: BC Managed Care – PPO | Admitting: Podiatry

## 2023-02-07 ENCOUNTER — Ambulatory Visit: Payer: BC Managed Care – PPO | Admitting: Family Medicine

## 2023-02-12 ENCOUNTER — Other Ambulatory Visit: Payer: Self-pay | Admitting: Family Medicine

## 2023-02-12 ENCOUNTER — Other Ambulatory Visit: Payer: Self-pay | Admitting: Internal Medicine

## 2023-02-12 DIAGNOSIS — E1122 Type 2 diabetes mellitus with diabetic chronic kidney disease: Secondary | ICD-10-CM

## 2023-02-12 DIAGNOSIS — E1169 Type 2 diabetes mellitus with other specified complication: Secondary | ICD-10-CM

## 2023-02-16 ENCOUNTER — Other Ambulatory Visit: Payer: Self-pay | Admitting: Family Medicine

## 2023-02-16 DIAGNOSIS — E1159 Type 2 diabetes mellitus with other circulatory complications: Secondary | ICD-10-CM

## 2023-02-16 DIAGNOSIS — I152 Hypertension secondary to endocrine disorders: Secondary | ICD-10-CM

## 2023-02-24 ENCOUNTER — Ambulatory Visit: Payer: BC Managed Care – PPO | Admitting: Internal Medicine

## 2023-02-24 ENCOUNTER — Encounter: Payer: Self-pay | Admitting: Internal Medicine

## 2023-02-24 VITALS — BP 118/70 | HR 75 | Resp 20 | Ht 70.0 in | Wt 200.2 lb

## 2023-02-24 DIAGNOSIS — E785 Hyperlipidemia, unspecified: Secondary | ICD-10-CM

## 2023-02-24 DIAGNOSIS — E663 Overweight: Secondary | ICD-10-CM | POA: Diagnosis not present

## 2023-02-24 DIAGNOSIS — E1122 Type 2 diabetes mellitus with diabetic chronic kidney disease: Secondary | ICD-10-CM

## 2023-02-24 DIAGNOSIS — N189 Chronic kidney disease, unspecified: Secondary | ICD-10-CM

## 2023-02-24 DIAGNOSIS — Z7984 Long term (current) use of oral hypoglycemic drugs: Secondary | ICD-10-CM

## 2023-02-24 DIAGNOSIS — Z7985 Long-term (current) use of injectable non-insulin antidiabetic drugs: Secondary | ICD-10-CM

## 2023-02-24 MED ORDER — REPAGLINIDE 1 MG PO TABS: 1.0000 mg | ORAL_TABLET | Freq: Every day | ORAL | 3 refills | Status: DC

## 2023-02-24 MED ORDER — OZEMPIC (1 MG/DOSE) 4 MG/3ML ~~LOC~~ SOPN
PEN_INJECTOR | SUBCUTANEOUS | 3 refills | Status: DC
Start: 2023-02-24 — End: 2023-05-08

## 2023-02-24 NOTE — Patient Instructions (Addendum)
Please continue: - Metformin ER 1000 mg with dinner - Farxiga 10 mg before breakfast - Ozempic 1 mg weekly in am   You can add back:  - Prandin 1 mg before dinner   Please return in 2-3 months with your sugar log.

## 2023-02-24 NOTE — Progress Notes (Signed)
Patient ID: CAS MERGEL, male   DOB: 11-Apr-1951, 72 y.o.   MRN: [id number]   HPI: ANTWIAN COLMENARES is a 72 y.o.-year-old male, presenting for follow-up for DM2, dx in 2017, non-insulin-dependent, uncontrolled, with complications (mild CKD, ED).  Last visit 4 months ago.  Interim history: No increased urination, no nausea, no chest pain.  He continues to have blurry vision R eye (detached retina and cataract sx).  Reviewed HbA1c levels: Lab Results  Component Value Date   HGBA1C 7.4 01/10/2023   HGBA1C 5.9 (A) 10/18/2022   HGBA1C 6.3 (H) 08/02/2022   HGBA1C 6.8 (A) 06/13/2022   HGBA1C 6.5 (A) 02/11/2022   HGBA1C 6.1 (A) 10/01/2021   HGBA1C 6.3 (A) 05/28/2021   HGBA1C 6.6 (H) 12/01/2020   HGBA1C 6.1 (A) 09/24/2020   HGBA1C 6.9 (A) 05/26/2020   Pt is on a regimen of: - Metformin ER 500 mg with dinner and 500 mg at bedtime >> 1000 mg with lunch >> with dinner - Farxiga 10 mg before b'fast - Ozempic 0.5 mg weekly in am  - started 02/2018 >> 1 mg weekly He was on Glipizide 5 mg 2x a day before meals -started 01/2019 >> stopped after adding Ozempic He stopped regular metformin due to abdominal pain. We stopped Prandin 1 mg before dinner in 09/2022 due to good control.  Pt checks his sugars once a day: - am: 113-159, 186, 193 >> 93-120, 132 >> 110-147, 166, 184 - 2h after b'fast: n/c >> 119 >> n/c - before lunch: n/c >> 119 >> 123 >> n/c - 2h after lunch:  141 >> n/c >> 106-119, 144 >> n/c - before dinner: 96-140, 164 >> 93-125, 138, 145 >> 102-137 - 2h after dinner: 61, 85-151 >> 154 >> 114-192 >> 99-114 >> 117 - bedtime:  140 >> n/c >> 114, 139, 146 >> 134, 195 >> n/c - nighttime: n/c Lowest sugar was 96 >> 93; he has hypoglycemia awareness in the 70s. Highest sugar was 346 >>...  193 >> 145 >> 363 (?)  Glucometer: Accu-Chek  Pt's meals are: - Breakfast: cereal (rice crispies) with milk, boiled eggs - Lunch: banana sandwich, whole wheat - Dinner: varies - Snacks: 2-3x Despite  repeated advice about stopping orange juice (with b'fast - 12 oz) and milk (with dinner - 12 oz), he continues on these, but decreased the amount.  -+ Mild CKD, last BUN/creatinine:  Lab Results  Component Value Date   BUN 29 (H) 09/23/2022   BUN 23 08/02/2022   CREATININE 1.23 09/23/2022   CREATININE 1.12 08/02/2022   Lab Results  Component Value Date   MICRALBCREAT 69 (H) 09/25/2019   MICRALBCREAT 9.2 10/29/2015  On valsartan 320.  -+ HL; last set of lipids: Lab Results  Component Value Date   CHOL 99 (L) 08/02/2022   HDL 40 08/02/2022   LDLCALC 44 08/02/2022   TRIG 72 08/02/2022   CHOLHDL 2.5 08/02/2022  He was not on a statin >> we started Pravastatin 20 mg daily. He takes this now.  - last eye exam was in 10/2022: No DR, + cataract.  Before last visit he had an episode of diplopia, resolved. He had retinal detachment sx 03/2021. He had cataract sx. In 2023.  - no numbness and tingling in his feet.  Last foot exam 01/12/2023 by Dr. Ardelle Anton.  Pt has FH of DM in PGF.  ROS: + see HPI  I reviewed pt's medications, allergies, PMH, social hx, family hx, and changes were documented  in the history of present illness. Otherwise, unchanged from my initial visit note.  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 60 tablet 5   dapagliflozin propanediol (FARXIGA) 10 MG TABS tablet Take 1 tablet (10 mg total) by mouth daily. 90 tablet 3   doxycycline (VIBRA-TABS) 100 MG tablet Take 1 tablet (100 mg total) by mouth 2 (two) times daily. 14 tablet 0   glucose blood (ACCU-CHEK GUIDE) test strip Use as instructed 100 strip 1   metFORMIN (GLUCOPHAGE-XR) 500 MG 24 hr tablet TAKE 2 tablets by mouth with a meal 180 tablet 3   metoprolol tartrate (LOPRESSOR) 25 MG tablet Take 1 tablet (25 mg total) by mouth 2 (two) times daily. 180 tablet 3   mupirocin ointment (BACTROBAN) 2 % Apply 1 Application topically daily. 30 g 1   pravastatin (PRAVACHOL) 10 MG tablet TAKE 1 TABLET BY MOUTH EVERY DAY 90 tablet 0   pravastatin (PRAVACHOL) 20 MG tablet Take 20 mg by mouth daily.     Semaglutide, 1 MG/DOSE, (OZEMPIC, 1 MG/DOSE,) 4 MG/3ML SOPN INJECT 0.75 MLS (1 MG TOTAL) INTO THE SKIN ONCE A WEEK. 9 mL 0   valsartan-hydrochlorothiazide (DIOVAN-HCT) 320-12.5 MG tablet TAKE 1 TABLET BY MOUTH EVERY DAY 90 tablet 0   No current facility-administered medications on file prior to visit.   Allergies  Allergen Reactions   Ampicillin Rash   Family History  Problem Relation Age of Onset   Hypertension Mother    Kidney disease Mother    Hypertension Father    Hypertension Brother    Diabetes Paternal Grandfather    Hypertension Paternal Grandfather    Kidney disease Maternal Aunt    Kidney disease Maternal Grandmother    PE: BP 118/70 (BP Location: Left Arm, Patient Position: Sitting, Cuff Size: Normal)   Pulse 75   Resp 20   Ht 5\' 10"  (1.778 m)   Wt 200 lb 3.2 oz (90.8 kg)   SpO2 96%   BMI  28.73 kg/m  Wt Readings from Last 10 Encounters:  02/24/23 200 lb 3.2 oz (90.8 kg)  01/10/23 201 lb (91.2 kg)  12/29/22 203 lb 1.9 oz (92.1 kg)  10/18/22 205 lb 12.8 oz (93.4 kg)  09/23/22 208 lb 9.6 oz (94.6 kg)  08/25/22  209 lb 3.2 oz (94.9 kg)  08/02/22 204 lb (92.5 kg)  06/13/22 208 lb 6.4 oz (94.5 kg)  02/11/22 205 lb 9.6 oz (93.3 kg)  10/01/21 209 lb 6.4 oz (95 kg)   Constitutional: overweight, in NAD Eyes:  EOMI, no exophthalmos ENT: no neck masses, no cervical lymphadenopathy Cardiovascular: RRR, No MRG, L leg more edematous Respiratory: CTA B Musculoskeletal: no deformities Skin:no rashes Neurological: no tremor with outstretched hands  ASSESSMENT: 1. DM2, non-insulin-dependent, now controlled, with long-term complications - mild CKD - ED  2. HL  3.  Overweight  PLAN:  1. Patient with longstanding, previously uncontrolled type 2 diabetes, with improved control in the last 4 years.  He continues on metformin, SGLT2 inhibitor and weekly GLP-1 receptor agonist.  At last visit, we stopped his meglitinide.  At that time, HbA1c was improved, at 5.9% and most of his blood sugars were at goal.  Since then, he started to see higher blood sugars and an HbA1c returned 7.4% approximately 1.5 month ago. -At today's visit, sugars are higher than before in the morning, mostly at goal but with occasional hyperglycemic values in the 160 and 180.  Later in the day, sugars are mostly at goal.  However, he is not checking sugars after dinner.  With the sugars being higher in the morning, and especially the holidays coming up, we did discuss that he can restart the Prandin before this meal.  Otherwise, we can continue the same regimen.  I advised him that if he starts to develop low blood sugars, we may need to back off the Prandin to only use it with larger meals. - I suggested to:  Patient Instructions  Please continue: - Metformin ER 1000 mg with dinner - Farxiga 10 mg before  breakfast - Ozempic 1 mg weekly in am   You can add back:  - Prandin 1 mg before dinner   Please return in 2-3 months with your sugar log.    - advised to check sugars at different times of the day - 1x a day, rotating check times - advised for yearly eye exams >> he is UTD - at today's visit, I would have liked to check an albumin to creatinine ratio - pt refuses today >> will check this at next visit - return to clinic in 2-3 months  2. HL -I reviewed his latest lipid panel: Excellent fractions: Lab Results  Component Value Date   CHOL 99 (L) 08/02/2022   HDL 40 08/02/2022   LDLCALC 44 08/02/2022   TRIG 72 08/02/2022   CHOLHDL 2.5 08/02/2022  -He continues on pravastatin 20 mg daily without side effects  3.  Overweight -continue SGLT 2 inhibitor and GLP-1 receptor agonist which should also help with weight loss -He lost 3 pounds before last visit and 5 more since then  Carlus Pavlov, MD PhD [facility] Endocrinology

## 2023-03-17 ENCOUNTER — Other Ambulatory Visit: Payer: Self-pay | Admitting: Internal Medicine

## 2023-05-01 ENCOUNTER — Encounter: Payer: Self-pay | Admitting: Family Medicine

## 2023-05-01 ENCOUNTER — Ambulatory Visit (INDEPENDENT_AMBULATORY_CARE_PROVIDER_SITE_OTHER): Payer: BC Managed Care – PPO | Admitting: Family Medicine

## 2023-05-01 VITALS — BP 125/80 | HR 76 | Resp 18 | Ht 70.0 in | Wt 203.0 lb

## 2023-05-01 DIAGNOSIS — I152 Hypertension secondary to endocrine disorders: Secondary | ICD-10-CM

## 2023-05-01 DIAGNOSIS — E1159 Type 2 diabetes mellitus with other circulatory complications: Secondary | ICD-10-CM | POA: Diagnosis not present

## 2023-05-01 DIAGNOSIS — E1169 Type 2 diabetes mellitus with other specified complication: Secondary | ICD-10-CM

## 2023-05-01 DIAGNOSIS — N182 Chronic kidney disease, stage 2 (mild): Secondary | ICD-10-CM

## 2023-05-01 DIAGNOSIS — E1122 Type 2 diabetes mellitus with diabetic chronic kidney disease: Secondary | ICD-10-CM

## 2023-05-01 DIAGNOSIS — Z7984 Long term (current) use of oral hypoglycemic drugs: Secondary | ICD-10-CM

## 2023-05-01 DIAGNOSIS — E785 Hyperlipidemia, unspecified: Secondary | ICD-10-CM

## 2023-05-01 LAB — POCT GLYCOSYLATED HEMOGLOBIN (HGB A1C): Hemoglobin A1C: 5.9 % — AB (ref 4.0–5.6)

## 2023-05-01 MED ORDER — VALSARTAN-HYDROCHLOROTHIAZIDE 320-12.5 MG PO TABS: 1.0000 | ORAL_TABLET | Freq: Every day | ORAL | 1 refills | Status: DC

## 2023-05-01 NOTE — Patient Instructions (Signed)
HOMEWORK: Be ready to leave a urine sample at your next appointment!

## 2023-05-01 NOTE — Assessment & Plan Note (Signed)
Stable, at goal.  Continue metoprolol tartrate 25 mg, valsartan-hydrochlorothiazide 320-12.5 mg.  Will continue to monitor and change therapy as indicated by labs and blood pressure.

## 2023-05-01 NOTE — Progress Notes (Signed)
Established Patient Office Visit  Subjective   Patient ID: Tom Fuller, male    DOB: 1951/03/18  Age: 72 y.o. MRN: [id number]  Chief Complaint  Patient presents with   Hyperlipidemia   Hypertension    HPI Tom Fuller is a 72 y.o. male presenting today for follow up of hypertension, hyperlipidemia, diabetes. Hypertension: Pt denies SOB, dizziness, edema, syncope, fatigue or heart palpitations. Taking valsartan-hydrochlorothiazide, reports excellent compliance with treatment. Denies side effects. Hyperlipidemia: tolerating pravastatin as prescribed by cardiology well with no myalgias or significant side effects. The ASCVD Risk score (Arnett DK, et al., 2019) failed to calculate for the following reasons:   The valid total cholesterol range is 130 to 320 mg/dL Diabetes: denies hypoglycemic events, wounds or sores that are not healing well, increased thirst or urination. Denies vision problems, eye exam up-to-date. Taking Ozempic and metformin as prescribed by endocrinology without any side effects.    Outpatient Medications Prior to Visit  Medication Sig   Accu-Chek FastClix Lancets MISC USE AS DIRECTED   apixaban (ELIQUIS) 5 MG TABS tablet Take 1 tablet (5 mg total) by mouth 2 (two) times daily.   dapagliflozin propanediol (FARXIGA) 10 MG TABS tablet Take 1 tablet (10 mg total) by mouth daily.   doxycycline (VIBRA-TABS) 100 MG tablet Take 1 tablet (100 mg total) by mouth 2 (two) times daily.   glucose blood (ACCU-CHEK GUIDE) test strip Use as instructed   metFORMIN (GLUCOPHAGE-XR) 500 MG 24 hr tablet TAKE 2 tablets by mouth with a meal   metoprolol tartrate (LOPRESSOR) 25 MG tablet Take 1 tablet (25 mg total) by mouth 2 (two) times daily.   mupirocin ointment (BACTROBAN) 2 % Apply 1 Application topically daily.   pravastatin (PRAVACHOL) 20 MG tablet TAKE 1 TABLET BY MOUTH EVERY DAY   repaglinide (PRANDIN) 1 MG tablet Take 1 tablet (1 mg total) by mouth daily before supper.    Semaglutide, 1 MG/DOSE, (OZEMPIC, 1 MG/DOSE,) 4 MG/3ML SOPN INJECT  1 MG TOTAL INTO THE SKIN ONCE A WEEK.   [DISCONTINUED] valsartan-hydrochlorothiazide (DIOVAN-HCT) 320-12.5 MG tablet TAKE 1 TABLET BY MOUTH EVERY DAY   No facility-administered medications prior to visit.    ROS Negative unless otherwise noted in HPI   Objective:     BP 125/80 (BP Location: Left Arm, Patient Position: Sitting, Cuff Size: Normal)   Pulse 76   Resp 18   Ht 5\' 10"  (1.778 m)   Wt 203 lb (92.1 kg)   SpO2 100%   BMI 29.13 kg/m   Physical Exam Constitutional:      General: He is not in acute distress.    Appearance: Normal appearance.  HENT:     Head: Normocephalic and atraumatic.  Cardiovascular:     Rate and Rhythm: Normal rate and regular rhythm.     Heart sounds: Normal heart sounds. No murmur heard.    No friction rub. No gallop.  Pulmonary:     Effort: Pulmonary effort is normal. No respiratory distress.     Breath sounds: Normal breath sounds. No wheezing, rhonchi or rales.  Skin:    General: Skin is warm and dry.  Neurological:     Mental Status: He is alert and oriented to person, place, and time.  Psychiatric:        Mood and Affect: Mood normal.    Results for orders placed or performed in visit on 05/01/23  POCT HgB A1C  Result Value Ref Range   Hemoglobin A1C 5.9 (A) 4.0 -  5.6 %   HbA1c POC (<> result, manual entry)     HbA1c, POC (prediabetic range)     HbA1c, POC (controlled diabetic range)       Assessment & Plan:  Type 2 diabetes mellitus with stage 2 chronic kidney disease, without long-term current use of insulin (HCC) Assessment & Plan: Followed by endocrinology.  A1c today 5.9.  Continue Farxiga 10 mg, metformin 500 mg, Prandin.  Will review endocrinology notes after next visit.  Continue ambulatory glucose monitoring.  Orders: -     POCT glycosylated hemoglobin (Hb A1C) -     CBC with Differential/Platelet; Future -     Comprehensive metabolic panel; Future -      Hemoglobin A1c; Future  Hypertension associated with diabetes [facility]) Assessment & Plan: Stable, at goal.  Continue metoprolol tartrate 25 mg, valsartan-hydrochlorothiazide 320-12.5 mg.  Will continue to monitor and change therapy as indicated by labs and blood pressure.  Orders: -     Valsartan-hydroCHLOROthiazide; Take 1 tablet by mouth daily.  Dispense: 90 tablet; Refill: 1 -     CBC with Differential/Platelet; Future -     Comprehensive metabolic panel; Future  Hyperlipidemia associated with type 2 diabetes mellitus (HCC) Assessment & Plan: Last lipid panel: LDL 44, HDL 40, triglycerides 72.  Continue pravastatin 20 mg daily.  Orders: -     CBC with Differential/Platelet; Future -     Comprehensive metabolic panel; Future -     Lipid panel; Future    Return in about 4 months (around 08/30/2023) for annual physical, fasting blood work 1 week before.    Melida Quitter, PA

## 2023-05-01 NOTE — Assessment & Plan Note (Signed)
Followed by endocrinology.  A1c today 5.9.  Continue Farxiga 10 mg, metformin 500 mg, Prandin.  Will review endocrinology notes after next visit.  Continue ambulatory glucose monitoring.

## 2023-05-01 NOTE — Assessment & Plan Note (Signed)
Last lipid panel: LDL 44, HDL 40, triglycerides 72.  Continue pravastatin 20 mg daily.

## 2023-05-05 ENCOUNTER — Ambulatory Visit: Payer: BC Managed Care – PPO | Admitting: Internal Medicine

## 2023-05-08 ENCOUNTER — Ambulatory Visit: Payer: BC Managed Care – PPO | Admitting: Internal Medicine

## 2023-05-08 ENCOUNTER — Encounter: Payer: Self-pay | Admitting: Internal Medicine

## 2023-05-08 VITALS — BP 118/80 | HR 85 | Ht 70.0 in | Wt 205.2 lb

## 2023-05-08 DIAGNOSIS — N189 Chronic kidney disease, unspecified: Secondary | ICD-10-CM | POA: Diagnosis not present

## 2023-05-08 DIAGNOSIS — E1122 Type 2 diabetes mellitus with diabetic chronic kidney disease: Secondary | ICD-10-CM

## 2023-05-08 DIAGNOSIS — E785 Hyperlipidemia, unspecified: Secondary | ICD-10-CM | POA: Diagnosis not present

## 2023-05-08 DIAGNOSIS — E663 Overweight: Secondary | ICD-10-CM

## 2023-05-08 DIAGNOSIS — Z7984 Long term (current) use of oral hypoglycemic drugs: Secondary | ICD-10-CM

## 2023-05-08 DIAGNOSIS — Z7985 Long-term (current) use of injectable non-insulin antidiabetic drugs: Secondary | ICD-10-CM

## 2023-05-08 MED ORDER — OZEMPIC (1 MG/DOSE) 4 MG/3ML ~~LOC~~ SOPN: PEN_INJECTOR | SUBCUTANEOUS | 3 refills | Status: DC

## 2023-05-08 NOTE — Patient Instructions (Signed)
Please continue: - Metformin ER 1000 mg with dinner - Prandin 1 mg before dinner - Farxiga 10 mg before breakfast - Ozempic 1 mg weekly in am    Please return in 4 months with your sugar log.

## 2023-05-08 NOTE — Progress Notes (Signed)
Patient ID: Tom Fuller, male   DOB: [birth year], 72 y.o.   MRN: [id number]   HPI: Tom Fuller is a 72 y.o.-year-old male, presenting for follow-up for DM2, dx in 2017, non-insulin-dependent, uncontrolled, with complications (mild CKD, ED).  Last visit 4 months ago.  Interim history: No increased urination, no nausea, no chest pain.   He continues to have some blurry vision R eye (detached retina and cataract sx).  Reviewed HbA1c levels: Lab Results  Component Value Date   HGBA1C 5.9 (A) 05/01/2023   HGBA1C 7.4 01/10/2023   HGBA1C 5.9 (A) 10/18/2022   HGBA1C 6.3 (H) 08/02/2022   HGBA1C 6.8 (A) 06/13/2022   HGBA1C 6.5 (A) 02/11/2022   HGBA1C 6.1 (A) 10/01/2021   HGBA1C 6.3 (A) 05/28/2021   HGBA1C 6.6 (H) 12/01/2020   HGBA1C 6.1 (A) 09/24/2020   Pt is on a regimen of: - Metformin ER 500 mg with dinner and 500 mg at bedtime >> 1000 mg with lunch >> with dinner - Prandin 1 mg before dinner-started 12/2022 - Farxiga 10 mg before b'fast - Ozempic 0.5 mg weekly in am  - started 02/2018 >> 1 mg weekly He was on Glipizide 5 mg 2x a day before meals -started 01/2019 >> stopped after adding Ozempic He stopped regular metformin due to abdominal pain. We stopped Prandin 1 mg before dinner in 09/2022 due to good control.  Pt checks his sugars once a day: - am: 93-120, 132 >> 110-147, 166, 184 >> 88-141, 190, 233 - 2h after b'fast: n/c >> 119 >> n/c - before lunch: n/c >> 119 >> 123 >> n/c - 2h after lunch:  141 >> n/c >> 106-119, 144 >> n/c  - before dinner:  93-125, 138, 145 >> 102-137 >> 80-120 - 2h after dinner: 154 >> 114-192 >> 99-114 >> 117 >> n/c - bedtime: n/c >> 114, 139, 146 >> 134, 195 >> n/c >> 102 - nighttime: n/c Lowest sugar was 96 >> 93 >> 80; he has hypoglycemia awareness in the 70s. Highest sugar was 346 >>...  363 (?) >> 235  Glucometer: Accu-Chek  Pt's meals are: - Breakfast: cereal (rice crispies) with milk, boiled eggs - Lunch: banana sandwich, whole wheat -  Dinner: varies - Snacks: 2-3x Despite repeated advice about stopping orange juice (with b'fast - 12 oz) and milk (with dinner - 12 oz), he continues on these, but decreased the amount.  -+ Mild CKD, last BUN/creatinine:  Lab Results  Component Value Date   BUN 29 (H) 09/23/2022   BUN 23 08/02/2022   CREATININE 1.23 09/23/2022   CREATININE 1.12 08/02/2022   Lab Results  Component Value Date   MICRALBCREAT 69 (H) 09/25/2019   MICRALBCREAT 9.2 10/29/2015  On valsartan 320.  -+ HL; last set of lipids: Lab Results  Component Value Date   CHOL 99 (L) 08/02/2022   HDL 40 08/02/2022   LDLCALC 44 08/02/2022   TRIG 72 08/02/2022   CHOLHDL 2.5 08/02/2022  He was not on a statin >> we started Pravastatin 20 mg daily. He takes this now.  - last eye exam was in 10/2022: No DR, + cataract.  Before last visit he had an episode of diplopia, resolved. He had retinal detachment sx 03/2021. He had cataract sx. In 2023.  - no numbness and tingling in his feet.  Last foot exam 01/12/2023 by Dr. Ardelle Anton.  Pt has FH of DM in PGF.  ROS: + see HPI  I reviewed pt's medications, allergies, PMH,  social hx, family hx, and changes were documented in the history of present illness. Otherwise, unchanged from my initial visit note.  Past Medical History:  Diagnosis Date   Atrial fibrillation (HCC)    Cervical lymphadenopathy    Erectile dysfunction    GERD (gastroesophageal reflux disease)    Hypertension    Lymphoma, follicular (HCC)    Dr.Shadad   Retinal detachment    Past Surgical History:  Procedure Laterality Date   a bsc     ABSCESS DRAINAGE     under rt eye    CATARACT EXTRACTION Right 05/2021   DEEP NECK LYMPH NODE BIOPSY / EXCISION  05/30/2009   VASECTOMY     Social History   Socioeconomic History   Marital status: Married    Spouse name: Not on file   Number of children: 2  Occupational History    Saw Designer, television/film set  Tobacco Use   Smoking status: Never Smoker   Smokeless  tobacco: Never Used  Substance and Sexual Activity   Alcohol use: No   Drug use: No   Sexual activity: Yes    Partners: Female   Current Outpatient Medications on File Prior to Visit  Medication Sig Dispense Refill   Accu-Chek FastClix Lancets MISC USE AS DIRECTED 102 each 1   apixaban (ELIQUIS) 5 MG TABS tablet Take 1 tablet (5 mg total) by mouth 2 (two) times daily. 60 tablet 5   dapagliflozin propanediol (FARXIGA) 10 MG TABS tablet Take 1 tablet (10 mg total) by mouth daily. 90 tablet 3   doxycycline (VIBRA-TABS) 100 MG tablet Take 1 tablet (100 mg total) by mouth 2 (two) times daily. 14 tablet 0   glucose blood (ACCU-CHEK GUIDE) test strip Use as instructed 100 strip 1   metFORMIN (GLUCOPHAGE-XR) 500 MG 24 hr tablet TAKE 2 tablets by mouth with a meal 180 tablet 3   metoprolol tartrate (LOPRESSOR) 25 MG tablet Take 1 tablet (25 mg total) by mouth 2 (two) times daily. 180 tablet 3   mupirocin ointment (BACTROBAN) 2 % Apply 1 Application topically daily. 30 g 1   pravastatin (PRAVACHOL) 20 MG tablet TAKE 1 TABLET BY MOUTH EVERY DAY 90 tablet 3   repaglinide (PRANDIN) 1 MG tablet Take 1 tablet (1 mg total) by mouth daily before supper. 90 tablet 3   Semaglutide, 1 MG/DOSE, (OZEMPIC, 1 MG/DOSE,) 4 MG/3ML SOPN INJECT  1 MG TOTAL INTO THE SKIN ONCE A WEEK. 9 mL 3   valsartan-hydrochlorothiazide (DIOVAN-HCT) 320-12.5 MG tablet Take 1 tablet by mouth daily. 90 tablet 1   No current facility-administered medications on file prior to visit.   Allergies  Allergen Reactions   Ampicillin Rash   Family History  Problem Relation Age of Onset   Hypertension Mother    Kidney disease Mother    Hypertension Father    Hypertension Brother    Diabetes Paternal Grandfather    Hypertension Paternal Grandfather    Kidney disease Maternal Aunt    Kidney disease Maternal Grandmother    PE: BP 118/80   Pulse 85   Ht 5\' 10"  (1.778 m)   Wt 205 lb 3.2 oz (93.1 kg)   SpO2 97%   BMI 29.44 kg/m  Wt  Readings from Last 10 Encounters:  05/08/23 205 lb 3.2 oz (93.1 kg)  05/01/23 203 lb (92.1 kg)  02/24/23 200 lb 3.2 oz (90.8 kg)  01/10/23 201 lb (91.2 kg)  12/29/22 203 lb 1.9 oz (92.1 kg)  10/18/22 205 lb 12.8 oz (  93.4 kg)  09/23/22 208 lb 9.6 oz (94.6 kg)  08/25/22 209 lb 3.2 oz (94.9 kg)  08/02/22 204 lb (92.5 kg)  06/13/22 208 lb 6.4 oz (94.5 kg)   Constitutional: overweight, in NAD Eyes:  EOMI, no exophthalmos ENT: no neck masses, no cervical lymphadenopathy Cardiovascular: RRR, No MRG, L leg more edematous Respiratory: CTA B Musculoskeletal: no deformities Skin:no rashes Neurological: no tremor with outstretched hands  ASSESSMENT: 1. DM2, non-insulin-dependent, now controlled, with long-term complications - mild CKD - ED  2. HL  3.  Overweight  PLAN:  1. Patient with longstanding, previously uncontrolled type 2 diabetes, with improved control in the last 4 years.  He is on metformin, SGLT2 inhibitor and weekly GLP-1 receptor agonist.  At last visit, sugars were higher than before in the morning, with occasional hyperglycemic values in the 160s and 180s.  Later in the day, sugars are mostly at goal and he was not checking after dinner.  We discussed about starting a meglitinide before dinner.  Otherwise, we continued the same regimen.  His HbA1c at that time was higher, at 7.4%, but he had another HbA1c obtained a week ago and this was much better, at 5.9%. -At today's visit, reviewing his blood sugar log, the vast majority of his blood sugars are at goal.  He had 2 high blood sugars in the 200s in the morning but he rechecked them immediately and that the new value was much lower.  We discussed that this could have been erroneous reads.  For now, I did not suggest a change in regimen.  He continues to try to improve his diet. - I suggested to:  Patient Instructions  Please continue: - Metformin ER 1000 mg with dinner - Prandin 1 mg before dinner - Farxiga 10 mg before  breakfast - Ozempic 1 mg weekly in am    Please return in 4 months with your sugar log.    - advised to check sugars at different times of the day - 1x a day, rotating check times - advised for yearly eye exams >> he is UTD - he declined an ACR at last visit he continues to decline it today as he mentions that he is not able to urinate... I strongly advised him not to urinate before our next visit so we can check this. - return to clinic in 4 months  2. HL -I reviewed his latest lipid panel: Excellent fractions: Lab Results  Component Value Date   CHOL 99 (L) 08/02/2022   HDL 40 08/02/2022   LDLCALC 44 08/02/2022   TRIG 72 08/02/2022   CHOLHDL 2.5 08/02/2022  -He continues on pravastatin 20 mg daily without side effects  3.  Overweight -continue SGLT 2 inhibitor and GLP-1 receptor agonist which should also help with weight loss -He lost 8 pounds before the last 2 visits combined, but gained 5 pounds since last visit  Carlus Pavlov, MD PhD [facility]

## 2023-05-11 ENCOUNTER — Other Ambulatory Visit: Payer: Self-pay | Admitting: Internal Medicine

## 2023-05-11 ENCOUNTER — Other Ambulatory Visit: Payer: Self-pay | Admitting: Family Medicine

## 2023-05-11 DIAGNOSIS — I480 Paroxysmal atrial fibrillation: Secondary | ICD-10-CM

## 2023-05-11 DIAGNOSIS — E785 Hyperlipidemia, unspecified: Secondary | ICD-10-CM

## 2023-05-12 NOTE — Telephone Encounter (Signed)
Prescription refill request for Eliquis received. Indication:afib Last office visit:3/24 Scr:1.23  4/24 Age: 72 Weight:93.1  kg  Prescription refilled

## 2023-05-19 ENCOUNTER — Other Ambulatory Visit: Payer: Self-pay | Admitting: Family Medicine

## 2023-07-06 ENCOUNTER — Encounter: Payer: Self-pay | Admitting: Family Medicine

## 2023-07-28 ENCOUNTER — Other Ambulatory Visit: Payer: Self-pay | Admitting: Internal Medicine

## 2023-08-16 ENCOUNTER — Other Ambulatory Visit: Payer: Self-pay | Admitting: Internal Medicine

## 2023-08-23 ENCOUNTER — Other Ambulatory Visit: Payer: BC Managed Care – PPO

## 2023-08-23 DIAGNOSIS — E1122 Type 2 diabetes mellitus with diabetic chronic kidney disease: Secondary | ICD-10-CM

## 2023-08-23 DIAGNOSIS — E1159 Type 2 diabetes mellitus with other circulatory complications: Secondary | ICD-10-CM

## 2023-08-23 DIAGNOSIS — E1169 Type 2 diabetes mellitus with other specified complication: Secondary | ICD-10-CM

## 2023-08-24 LAB — COMPREHENSIVE METABOLIC PANEL WITH GFR
ALT: 11 IU/L (ref 0–44)
AST: 15 IU/L (ref 0–40)
Albumin: 4.5 g/dL (ref 3.8–4.8)
Alkaline Phosphatase: 73 IU/L (ref 44–121)
BUN/Creatinine Ratio: 24 (ref 10–24)
BUN: 28 mg/dL — ABNORMAL HIGH (ref 8–27)
Bilirubin Total: 0.9 mg/dL (ref 0.0–1.2)
CO2: 23 mmol/L (ref 20–29)
Calcium: 10.3 mg/dL — ABNORMAL HIGH (ref 8.6–10.2)
Chloride: 102 mmol/L (ref 96–106)
Creatinine, Ser: 1.17 mg/dL (ref 0.76–1.27)
Globulin, Total: 2.1 g/dL (ref 1.5–4.5)
Glucose: 96 mg/dL (ref 70–99)
Potassium: 4.3 mmol/L (ref 3.5–5.2)
Sodium: 141 mmol/L (ref 134–144)
Total Protein: 6.6 g/dL (ref 6.0–8.5)
eGFR: 67 mL/min/{1.73_m2} (ref >59)

## 2023-08-24 LAB — CBC WITH DIFFERENTIAL/PLATELET
Basophils Absolute: 0.1 10*3/uL (ref 0.0–0.2)
Basos: 1 %
EOS (ABSOLUTE): 0.6 10*3/uL — ABNORMAL HIGH (ref 0.0–0.4)
Eos: 9 %
Hematocrit: 45 % (ref 37.5–51.0)
Hemoglobin: 15.1 g/dL (ref 13.0–17.7)
Immature Grans (Abs): 0 10*3/uL (ref 0.0–0.1)
Immature Granulocytes: 0 %
Lymphocytes Absolute: 1.9 10*3/uL (ref 0.7–3.1)
Lymphs: 27 %
MCH: 31.7 pg (ref 26.6–33.0)
MCHC: 33.6 g/dL (ref 31.5–35.7)
MCV: 95 fL (ref 79–97)
Monocytes Absolute: 0.8 10*3/uL (ref 0.1–0.9)
Monocytes: 10 %
Neutrophils Absolute: 3.9 10*3/uL (ref 1.4–7.0)
Neutrophils: 53 %
Platelets: 216 10*3/uL (ref 150–450)
RBC: 4.76 x10E6/uL (ref 4.14–5.80)
RDW: 13.2 % (ref 11.6–15.4)
WBC: 7.3 10*3/uL (ref 3.4–10.8)

## 2023-08-24 LAB — LIPID PANEL
Chol/HDL Ratio: 2.5 ratio (ref 0.0–5.0)
Cholesterol, Total: 99 mg/dL — ABNORMAL LOW (ref 100–199)
HDL: 39 mg/dL — ABNORMAL LOW (ref >39)
LDL Chol Calc (NIH): 46 mg/dL (ref 0–99)
Triglycerides: 62 mg/dL (ref 0–149)
VLDL Cholesterol Cal: 14 mg/dL (ref 5–40)

## 2023-08-24 LAB — HEMOGLOBIN A1C
Est. average glucose Bld gHb Est-mCnc: 126 mg/dL
Hgb A1c MFr Bld: 6 % — ABNORMAL HIGH (ref 4.8–5.6)

## 2023-08-28 ENCOUNTER — Ambulatory Visit (INDEPENDENT_AMBULATORY_CARE_PROVIDER_SITE_OTHER): Payer: BC Managed Care – PPO | Admitting: Family Medicine

## 2023-08-28 ENCOUNTER — Encounter: Payer: Self-pay | Admitting: Family Medicine

## 2023-08-28 VITALS — BP 111/69 | HR 88 | Ht 70.0 in | Wt 203.0 lb

## 2023-08-28 DIAGNOSIS — E1122 Type 2 diabetes mellitus with diabetic chronic kidney disease: Secondary | ICD-10-CM | POA: Diagnosis not present

## 2023-08-28 DIAGNOSIS — Z7985 Long-term (current) use of injectable non-insulin antidiabetic drugs: Secondary | ICD-10-CM

## 2023-08-28 DIAGNOSIS — N182 Chronic kidney disease, stage 2 (mild): Secondary | ICD-10-CM

## 2023-08-28 DIAGNOSIS — E1169 Type 2 diabetes mellitus with other specified complication: Secondary | ICD-10-CM

## 2023-08-28 DIAGNOSIS — Z0000 Encounter for general adult medical examination without abnormal findings: Secondary | ICD-10-CM | POA: Diagnosis not present

## 2023-08-28 DIAGNOSIS — C8280 Other types of follicular lymphoma, unspecified site: Secondary | ICD-10-CM | POA: Diagnosis not present

## 2023-08-28 DIAGNOSIS — I5032 Chronic diastolic (congestive) heart failure: Secondary | ICD-10-CM

## 2023-08-28 DIAGNOSIS — Z125 Encounter for screening for malignant neoplasm of prostate: Secondary | ICD-10-CM

## 2023-08-28 DIAGNOSIS — E785 Hyperlipidemia, unspecified: Secondary | ICD-10-CM

## 2023-08-28 DIAGNOSIS — E1159 Type 2 diabetes mellitus with other circulatory complications: Secondary | ICD-10-CM

## 2023-08-28 DIAGNOSIS — I152 Hypertension secondary to endocrine disorders: Secondary | ICD-10-CM

## 2023-08-28 NOTE — Assessment & Plan Note (Signed)
 Follows with cardiology.  Appointment later this month.  Continue metoprolol.  Not in exacerbation.

## 2023-08-28 NOTE — Assessment & Plan Note (Signed)
 A1c at goal.  Managed by endocrinology.  Continue Farxiga, metformin, repaglinide, semaglutide.

## 2023-08-28 NOTE — Assessment & Plan Note (Signed)
 Cholesterol level at goal.  Continue pravastatin.

## 2023-08-28 NOTE — Progress Notes (Signed)
   Annual physical  Subjective   Patient ID: Tom Fuller, male    DOB: [birth year]  Age: 73 y.o. MRN: [id number]  Chief Complaint  Patient presents with   Annual Exam   HPI Tom Fuller is a 73 y.o. old male here  for annual exam.   Patient has no concerns for himself today.  Is concerned about his wife's health and how it is affecting her.  Work: works in Personnel officer.   Children:2 kids.  3 grandkids.   Tobacco:no Alcohol:no Recreational drugs:no  Diet:regular  Exercise:occasional walkin.   Other providers: Hematology, cardiology, endocrinology   The ASCVD Risk score (Arnett DK, et al., 2019) failed to calculate for the following reasons:   The valid total cholesterol range is 130 to 320 mg/dL  Health Maintenance Due  Topic Date Due   COVID-19 Vaccine (1) Never done   Pneumonia Vaccine 41+ Years old (2 of 2 - PPSV23 or PCV20) 09/14/2018   Diabetic kidney evaluation - Urine ACR  09/24/2020      Objective:     BP 111/69   Pulse 88   Ht 5\' 10"  (1.778 m)   Wt 203 lb (92.1 kg)   SpO2 98%   BMI 29.13 kg/m    Physical Exam General: Alert, oriented HEENT: PERRLA, EOMI, moist mucosa.  Small right buccal aphthous ulcer CV: Regular rate rhythm Pulmonary: Lungs are bilaterally GI: Soft, nontender MSK: Strength are bilaterally Psych: Pleasant affect   No results found for any visits on 08/28/23.      Assessment & Plan:   Physical exam, annual  Prostate cancer screening -     PSA; Future  Type 2 diabetes mellitus with stage 2 chronic kidney disease, without long-term current use of insulin (HCC) -     Microalbumin / creatinine urine ratio; Future  Chronic diastolic heart failure (HCC) Assessment & Plan: Follows with cardiology.  Appointment later this month.  Continue metoprolol.  Not in exacerbation.   Hyperlipidemia associated with type 2 diabetes mellitus (HCC) Assessment & Plan: Cholesterol level at goal.  Continue  pravastatin.   Hypertension associated with diabetes [facility]) Assessment & Plan: At goal.  Continue Diovan-HCTZ.   Other type of follicular lymphoma, unspecified body region [facility]) Assessment & Plan: In remission.  Continue yearly follow-ups with oncology.      Return in about 6 months (around 02/27/2024) for hld, DM, HTN.    Tom Kitty, MD

## 2023-08-28 NOTE — Assessment & Plan Note (Signed)
At goal. Continue Diovan-HCTZ.

## 2023-08-28 NOTE — Patient Instructions (Addendum)
 It was nice to see you today,  We addressed the following topics today: -I would like to do a prostate cancer screening and A colon cancer screening. - The prostate cancer screening is a blood test that you can get done here.  At the same time you can also do your urine test for proteinuria - The colon cancer screening would be a colonoscopy from the same place you had your previous colonoscopy.  It looks like the last one was done in 2007 so you should be due for it.  It will possibly be the last 1 you are asked to do. - Try over-the-counter magnesium oxide 400 mg at night to help with leg cramps.  You can also keep using pickle juice as needed  Have a great day,  Frederic Jericho, MD

## 2023-08-28 NOTE — Assessment & Plan Note (Signed)
 In remission.  Continue yearly follow-ups with oncology.

## 2023-08-30 ENCOUNTER — Encounter: Payer: BC Managed Care – PPO | Admitting: Family Medicine

## 2023-09-01 ENCOUNTER — Other Ambulatory Visit

## 2023-09-01 DIAGNOSIS — Z125 Encounter for screening for malignant neoplasm of prostate: Secondary | ICD-10-CM

## 2023-09-01 DIAGNOSIS — E1122 Type 2 diabetes mellitus with diabetic chronic kidney disease: Secondary | ICD-10-CM

## 2023-09-02 LAB — MICROALBUMIN / CREATININE URINE RATIO
Creatinine, Urine: 56.8 mg/dL
Microalb/Creat Ratio: 20 mg/g{creat} (ref 0–29)
Microalbumin, Urine: 11.3 ug/mL

## 2023-09-02 LAB — PSA: Prostate Specific Ag, Serum: 0.7 ng/mL (ref 0.0–4.0)

## 2023-09-04 ENCOUNTER — Encounter: Payer: Self-pay | Admitting: Family Medicine

## 2023-09-08 ENCOUNTER — Ambulatory Visit: Payer: BC Managed Care – PPO | Admitting: Internal Medicine

## 2023-09-08 ENCOUNTER — Encounter: Payer: Self-pay | Admitting: Internal Medicine

## 2023-09-08 VITALS — BP 120/80 | HR 86 | Ht 70.0 in | Wt 204.4 lb

## 2023-09-08 DIAGNOSIS — E785 Hyperlipidemia, unspecified: Secondary | ICD-10-CM

## 2023-09-08 DIAGNOSIS — Z794 Long term (current) use of insulin: Secondary | ICD-10-CM

## 2023-09-08 DIAGNOSIS — E1151 Type 2 diabetes mellitus with diabetic peripheral angiopathy without gangrene: Secondary | ICD-10-CM

## 2023-09-08 DIAGNOSIS — E1122 Type 2 diabetes mellitus with diabetic chronic kidney disease: Secondary | ICD-10-CM | POA: Diagnosis not present

## 2023-09-08 DIAGNOSIS — E663 Overweight: Secondary | ICD-10-CM

## 2023-09-08 MED ORDER — REPAGLINIDE 1 MG PO TABS: 1.0000 mg | ORAL_TABLET | Freq: Every day | ORAL | 3 refills | Status: DC

## 2023-09-08 MED ORDER — DAPAGLIFLOZIN PROPANEDIOL 10 MG PO TABS: 10.0000 mg | ORAL_TABLET | Freq: Every day | ORAL | 3 refills | Status: DC

## 2023-09-08 NOTE — Progress Notes (Signed)
 Patient ID: AHAAN ZOBRIST, male   DOB: 1950-07-04, 73 y.o.   MRN: [id number]   HPI: VEGAS FRITZE is a 73 y.o.-year-old male, presenting for follow-up for DM2, dx in 2017, non-insulin-dependent, uncontrolled, with complications (mild CKD, ED).  Last visit 4 months ago.  Interim history: No increased urination, no nausea, no chest pain.   He continues to have some blurry vision R eye (detached retina and cataract sx).  Reviewed HbA1c levels: Lab Results  Component Value Date   HGBA1C 6.0 (H) 08/23/2023   HGBA1C 5.9 (A) 05/01/2023   HGBA1C 7.4 01/10/2023   HGBA1C 5.9 (A) 10/18/2022   HGBA1C 6.3 (H) 08/02/2022   HGBA1C 6.8 (A) 06/13/2022   HGBA1C 6.5 (A) 02/11/2022   HGBA1C 6.1 (A) 10/01/2021   HGBA1C 6.3 (A) 05/28/2021   HGBA1C 6.6 (H) 12/01/2020   Pt is on a regimen of: - Metformin ER 500 mg with dinner and 500 mg at bedtime >> 1000 mg with lunch >> with dinner - Prandin 1 mg before dinner-started 12/2022 - Farxiga 10 mg before b'fast - Ozempic 0.5 mg weekly in am  - started 02/2018 >> 1 mg weekly He was on Glipizide 5 mg 2x a day before meals -started 01/2019 >> stopped after adding Ozempic He stopped regular metformin due to abdominal pain. We stopped Prandin 1 mg before dinner in 09/2022 due to good control.  Pt checks his sugars once a day: - am: 93-120, 132 >> 110-147, 166, 184 >> 88-141, 190, 233 >> 94-127 - 2h after b'fast: n/c >> 119 >> n/c - before lunch: n/c >> 119 >> 123 >> n/c - 2h after lunch:  141 >> n/c >> 106-119, 144 >> n/c  - before dinner:  93-125, 138, 145 >> 102-137 >> 80-120 >> 97-125, 133 - 2h after dinner: 154 >> 114-192 >> 99-114 >> 117 >> n/c >> 105 - bedtime: n/c >> 114, 139, 146 >> 134, 195 >> n/c >> 102 >> N/c - nighttime: n/c Lowest sugar was 96 >> 93 >> 80 >> 94; he has hypoglycemia awareness in the 70s. Highest sugar was 346 >>...  363 (?) >> 235 >> 222 (>3 mo ago)  Glucometer: Accu-Chek  Pt's meals are: - Breakfast: cereal (rice crispies)  with milk, boiled eggs - Lunch: banana sandwich, whole wheat - Dinner: varies - Snacks: 2-3x Despite repeated advice about stopping orange juice (with b'fast - 12 oz) and milk (with dinner - 12 oz), he continues on these, but decreased the amount.  -+ Mild CKD, last BUN/creatinine:  Lab Results  Component Value Date   BUN 28 (H) 08/23/2023   BUN 29 (H) 09/23/2022   CREATININE 1.17 08/23/2023   CREATININE 1.23 09/23/2022   Lab Results  Component Value Date   MICRALBCREAT 20 09/01/2023   MICRALBCREAT 69 (H) 09/25/2019   MICRALBCREAT 9.2 10/29/2015  On valsartan 320.  -+ HL; last set of lipids: Lab Results  Component Value Date   CHOL 99 (L) 08/23/2023   HDL 39 (L) 08/23/2023   LDLCALC 46 08/23/2023   TRIG 62 08/23/2023   CHOLHDL 2.5 08/23/2023  He was not on a statin >> we started Pravastatin 20 mg daily. He takes this now.  - last eye exam was in 10/2022: No DR, + cataract.  Before last visit he had an episode of diplopia, resolved. He had retinal detachment sx 03/2021. He had cataract sx. In 2023.  - no numbness and tingling in his feet.  Last foot exam 01/12/2023  by Dr. Ardelle Anton.  Pt has FH of DM in PGF.  ROS: + see HPI  I reviewed pt's medications, allergies, PMH, social hx, family hx, and changes were documented in the history of present illness. Otherwise, unchanged from my initial visit note.  Past Medical History:  Diagnosis Date   Atrial fibrillation (HCC)    Cervical lymphadenopathy    Erectile dysfunction    GERD (gastroesophageal reflux disease)    Hypertension    Lymphoma, follicular (HCC)    Dr.Shadad   Retinal detachment    Past Surgical History:  Procedure Laterality Date   a bsc     ABSCESS DRAINAGE     under rt eye    CATARACT EXTRACTION Right 05/2021   DEEP NECK LYMPH NODE BIOPSY / EXCISION  05/30/2009   VASECTOMY     Social History   Socioeconomic History   Marital status: Married    Spouse name: Not on file   Number of children: 2   Occupational History    Saw Designer, television/film set  Tobacco Use   Smoking status: Never Smoker   Smokeless tobacco: Never Used  Substance and Sexual Activity   Alcohol use: No   Drug use: No   Sexual activity: Yes    Partners: Female   Current Outpatient Medications on File Prior to Visit  Medication Sig Dispense Refill   Accu-Chek FastClix Lancets MISC USE AS DIRECTED 102 each 1   dapagliflozin propanediol (FARXIGA) 10 MG TABS tablet Take 1 tablet (10 mg total) by mouth daily. 90 tablet 3   ELIQUIS 5 MG TABS tablet TAKE 1 TABLET BY MOUTH TWICE A DAY 60 tablet 5   glucose blood (ACCU-CHEK GUIDE) test strip Use as instructed 100 strip 1   metFORMIN (GLUCOPHAGE-XR) 500 MG 24 hr tablet TAKE 2 TABLETS BY MOUTH WITH A MEAL 180 tablet 3   metoprolol tartrate (LOPRESSOR) 25 MG tablet TAKE 1 TABLET BY MOUTH TWICE A DAY 180 tablet 0   pravastatin (PRAVACHOL) 20 MG tablet TAKE 1 TABLET BY MOUTH EVERY DAY 90 tablet 3   pravastatin (PRAVACHOL) 20 MG tablet Take 1 tablet (20 mg total) by mouth daily. 90 tablet 3   repaglinide (PRANDIN) 1 MG tablet Take 1 tablet (1 mg total) by mouth daily before supper. 90 tablet 3   Semaglutide, 1 MG/DOSE, (OZEMPIC, 1 MG/DOSE,) 4 MG/3ML SOPN INJECT  1 MG TOTAL INTO THE SKIN ONCE A WEEK. 9 mL 3   valsartan-hydrochlorothiazide (DIOVAN-HCT) 320-12.5 MG tablet Take 1 tablet by mouth daily. 90 tablet 1   No current facility-administered medications on file prior to visit.   Allergies  Allergen Reactions   Ampicillin Rash   Family History  Problem Relation Age of Onset   Hypertension Mother    Kidney disease Mother    Hypertension Father    Hypertension Brother    Diabetes Paternal Grandfather    Hypertension Paternal Grandfather    Kidney disease Maternal Aunt    Kidney disease Maternal Grandmother    PE: BP 120/80   Pulse 86   Ht 5\' 10"  (1.778 m)   Wt 204 lb 6.4 oz (92.7 kg)   SpO2 98%   BMI 29.33 kg/m  Wt Readings from Last 10 Encounters:  09/08/23 204 lb 6.4  oz (92.7 kg)  08/28/23 203 lb (92.1 kg)  05/08/23 205 lb 3.2 oz (93.1 kg)  05/01/23 203 lb (92.1 kg)  02/24/23 200 lb 3.2 oz (90.8 kg)  01/10/23 201 lb (91.2 kg)  12/29/22 203 lb  1.9 oz (92.1 kg)  10/18/22 205 lb 12.8 oz (93.4 kg)  09/23/22 208 lb 9.6 oz (94.6 kg)  08/25/22 209 lb 3.2 oz (94.9 kg)   Constitutional: overweight, in NAD Eyes:  EOMI, no exophthalmos ENT: no neck masses, no cervical lymphadenopathy Cardiovascular: RRR, No MRG Respiratory: CTA B Musculoskeletal: no deformities Skin:no rashes Neurological: no tremor with outstretched hands  ASSESSMENT: 1. DM2, non-insulin-dependent, now controlled, with long-term complications - mild CKD - ED  2. HL  3.  Overweight  PLAN:  1. Patient with longstanding, previously uncontrolled, type 2 diabetes, with improved control in the last 4 years.  He is on metformin, meglitinide, SGLT2 inhibitor and weekly GLP-1 receptor agonist.  At last visit, HbA1c was 5.9%, much better.  The vast majority of the blood sugars were at goal so we did not change his regimen.  He had another HbA1c obtained last month that this was 6.0%, only slightly higher. - at today's visit almost all of his blood sugars are at goal.  He does not have low blood sugars or hyperglycemia except 1 glucose of 222 more than 3 months ago.  For now, we discussed about continuing the same regimen and I refilled 2 of his prescriptions. - I suggested to:  Patient Instructions  Please continue: - Metformin ER 1000 mg with dinner - Prandin 1 mg before dinner - Farxiga 10 mg before breakfast - Ozempic 1 mg weekly in am    Please return in 4 months with your sugar log.   - advised to check sugars at different times of the day - 1x a day, rotating check times - advised for yearly eye exams >> he is UTD - return to clinic in 4 months  2. HL - Latest lipid fractions were at goal with the exception of a slightly high HDL Lab Results  Component Value Date   CHOL 99 (L)  08/23/2023   HDL 39 (L) 08/23/2023   LDLCALC 46 08/23/2023   TRIG 62 08/23/2023   CHOLHDL 2.5 08/23/2023  - He continues on pravastatin 20 mg daily without side effects  3.  Overweight -continue SGLT 2 inhibitor and GLP-1 receptor agonist which should also help with weight loss - He gained 5 pounds before last visit; he lost 1 pound since then  Carlus Pavlov, MD PhD [facility]

## 2023-09-08 NOTE — Patient Instructions (Addendum)
 Please continue: - Metformin ER 1000 mg with dinner - Prandin 1 mg before dinner - Farxiga 10 mg before breakfast - Ozempic 1 mg weekly in am    Please return in 3-4 months with your sugar log.

## 2023-09-12 ENCOUNTER — Other Ambulatory Visit: Payer: Self-pay | Admitting: Internal Medicine

## 2023-09-20 NOTE — Progress Notes (Unsigned)
  Electrophysiology Office Note:   Date:  09/21/2023  ID:  AQEEL NORGAARD, DOB 08-Jun-1950, MRN [id number]  Primary Cardiologist: None Electrophysiologist: Manya Sells, MD      History of Present Illness:   Tom Fuller is a 73 y.o. male with h/o HTN, PAF, and lymphoma seen today for routine electrophysiology followup.   Since last being seen in our clinic the patient reports doing well from a cardiac perspective. States at times in the am he gets a funny feeling in his epigastric region/lower chest when he has coffee on an otherwise empty stomach. Otherwise, he denies chest pain, palpitations, dyspnea, PND, orthopnea, nausea, vomiting, dizziness, syncope, edema, weight gain, or early satiety.   Review of systems complete and found to be negative unless listed in HPI.   EP Information / Studies Reviewed:    EKG is ordered today. Personal review as below.  EKG Interpretation Date/Time:  Thursday September 21 2023 11:55:27 EDT Ventricular Rate:  68 PR Interval:    QRS Duration:  108 QT Interval:  380 QTC Calculation: 404 R Axis:   79  Text Interpretation: Atrial fibrillation Incomplete right bundle branch block No previous ECGs available Confirmed by Pilar Bridge [phone]) on 09/21/2023 11:58:40 AM    Arrhythmia/Device History No specialty comments available.   Physical Exam:   VS:  BP 120/76 (BP Location: Left Arm, Patient Position: Sitting, Cuff Size: Large)   Pulse 68   Ht 5\' 10"  (1.778 m)   Wt 202 lb (91.6 kg)   SpO2 98%   BMI 28.98 kg/m    Wt Readings from Last 3 Encounters:  09/21/23 202 lb (91.6 kg)  09/08/23 204 lb 6.4 oz (92.7 kg)  08/28/23 203 lb (92.1 kg)     GEN: No acute distress NECK: No JVD; No carotid bruits CARDIAC: Regular rate and rhythm, no murmurs, rubs, gallops RESPIRATORY:  Clear to auscultation without rales, wheezing or rhonchi  ABDOMEN: Soft, non-tender, non-distended EXTREMITIES:  No edema; No deformity   ASSESSMENT AND PLAN:    Permanent  AF EKG today shows rate controlled AF. Continue eliquis  5 mg BID for CHA2DS2/VASc of at least 4  Secondary hypercoagulable state Pt on Eliquis  as above   HTN Stable on current regimen   Obesity Body mass index is 28.98 kg/m.  Encouraged lifestyle modification   ?Indigestion Encouraged him to have his coffee with food or try half-caf/decaf to see if that is contributing.   Follow up with EP APP in 12 months  Signed, Tylene Galla, PA-C

## 2023-09-21 ENCOUNTER — Ambulatory Visit: Payer: BC Managed Care – PPO | Attending: Student | Admitting: Student

## 2023-09-21 ENCOUNTER — Encounter: Payer: Self-pay | Admitting: Student

## 2023-09-21 VITALS — BP 120/76 | HR 68 | Ht 70.0 in | Wt 202.0 lb

## 2023-09-21 DIAGNOSIS — D6869 Other thrombophilia: Secondary | ICD-10-CM

## 2023-09-21 DIAGNOSIS — I4821 Permanent atrial fibrillation: Secondary | ICD-10-CM | POA: Diagnosis not present

## 2023-09-21 DIAGNOSIS — I10 Essential (primary) hypertension: Secondary | ICD-10-CM | POA: Diagnosis not present

## 2023-09-21 DIAGNOSIS — I4819 Other persistent atrial fibrillation: Secondary | ICD-10-CM

## 2023-09-21 NOTE — Patient Instructions (Signed)
 Medication Instructions:  Your physician recommends that you continue on your current medications as directed. Please refer to the Current Medication list given to you today.  *If you need a refill on your cardiac medications before your next appointment, please call your pharmacy*  Lab Work: None ordered If you have labs (blood work) drawn today and your tests are completely normal, you will receive your results only by: MyChart Message (if you have MyChart) OR A paper copy in the mail If you have any lab test that is abnormal or we need to change your treatment, we will call you to review the results.  Follow-Up: At [facility], you and your health needs are our priority.  As part of our continuing mission to provide you with exceptional heart care, our providers are all part of one team.  This team includes your primary Cardiologist (physician) and Advanced Practice Providers or APPs (Physician Assistants and Nurse Practitioners) who all work together to provide you with the care you need, when you need it.  Your next appointment:   1 year(s)  Provider:   You will see one of the following Advanced Practice Providers on your designated Care Team:   Francis Dowse, [place] Casimiro Needle "Mardelle Matte" Tillery, PA-C Canary Brim, NP      1st Floor: - Lobby - Registration  - Pharmacy  - Lab - Cafe  2nd Floor: - PV Lab - Diagnostic Testing (echo, CT, nuclear med)  3rd Floor: - Vacant  4th Floor: - TCTS (cardiothoracic surgery) - AFib Clinic - Structural Heart Clinic - Vascular Surgery  - Vascular Ultrasound  5th Floor: - HeartCare Cardiology (general and EP) - Clinical Pharmacy for coumadin, hypertension, lipid, weight-loss medications, and med management appointments    Valet parking services will be available as well.

## 2023-09-22 ENCOUNTER — Other Ambulatory Visit: Payer: Self-pay | Admitting: *Deleted

## 2023-09-22 ENCOUNTER — Inpatient Hospital Stay: Payer: BC Managed Care – PPO | Attending: Hematology and Oncology

## 2023-09-22 ENCOUNTER — Inpatient Hospital Stay (HOSPITAL_BASED_OUTPATIENT_CLINIC_OR_DEPARTMENT_OTHER): Payer: BC Managed Care – PPO | Admitting: Hematology and Oncology

## 2023-09-22 VITALS — BP 113/79 | HR 86 | Temp 97.4°F | Resp 15 | Wt 204.6 lb

## 2023-09-22 DIAGNOSIS — C8298 Follicular lymphoma, unspecified, lymph nodes of multiple sites: Secondary | ICD-10-CM

## 2023-09-22 DIAGNOSIS — Z8572 Personal history of non-Hodgkin lymphomas: Secondary | ICD-10-CM | POA: Diagnosis present

## 2023-09-22 DIAGNOSIS — Z9221 Personal history of antineoplastic chemotherapy: Secondary | ICD-10-CM | POA: Diagnosis not present

## 2023-09-22 LAB — CMP (CANCER CENTER ONLY)
ALT: 11 U/L (ref 0–44)
AST: 13 U/L — ABNORMAL LOW (ref 15–41)
Albumin: 4.4 g/dL (ref 3.5–5.0)
Alkaline Phosphatase: 64 U/L (ref 38–126)
Anion gap: 5 (ref 5–15)
BUN: 35 mg/dL — ABNORMAL HIGH (ref 8–23)
CO2: 29 mmol/L (ref 22–32)
Calcium: 10 mg/dL (ref 8.9–10.3)
Chloride: 106 mmol/L (ref 98–111)
Creatinine: 1.26 mg/dL — ABNORMAL HIGH (ref 0.61–1.24)
GFR, Estimated: >60 mL/min (ref >60)
Glucose, Bld: 117 mg/dL — ABNORMAL HIGH (ref 70–99)
Potassium: 4.6 mmol/L (ref 3.5–5.1)
Sodium: 140 mmol/L (ref 135–145)
Total Bilirubin: 0.9 mg/dL (ref 0.0–1.2)
Total Protein: 6.6 g/dL (ref 6.5–8.1)

## 2023-09-22 LAB — CBC WITH DIFFERENTIAL (CANCER CENTER ONLY)
Abs Immature Granulocytes: 0.01 10*3/uL (ref 0.00–0.07)
Basophils Absolute: 0.1 10*3/uL (ref 0.0–0.1)
Basophils Relative: 1 %
Eosinophils Absolute: 0.6 10*3/uL — ABNORMAL HIGH (ref 0.0–0.5)
Eosinophils Relative: 7 %
HCT: 40.2 % (ref 39.0–52.0)
Hemoglobin: 14.3 g/dL (ref 13.0–17.0)
Immature Granulocytes: 0 %
Lymphocytes Relative: 23 %
Lymphs Abs: 1.8 10*3/uL (ref 0.7–4.0)
MCH: 32.4 pg (ref 26.0–34.0)
MCHC: 35.6 g/dL (ref 30.0–36.0)
MCV: 91 fL (ref 80.0–100.0)
Monocytes Absolute: 0.9 10*3/uL (ref 0.1–1.0)
Monocytes Relative: 11 %
Neutro Abs: 4.8 10*3/uL (ref 1.7–7.7)
Neutrophils Relative %: 58 %
Platelet Count: 203 10*3/uL (ref 150–400)
RBC: 4.42 MIL/uL (ref 4.22–5.81)
RDW: 13.2 % (ref 11.5–15.5)
WBC Count: 8.2 10*3/uL (ref 4.0–10.5)
nRBC: 0 % (ref 0.0–0.2)

## 2023-09-22 LAB — LACTATE DEHYDROGENASE: LDH: 113 U/L (ref 98–192)

## 2023-09-22 NOTE — Progress Notes (Signed)
 [facility] [phone]   [phone]  PROGRESS NOTE  Patient Care Team: Noreene Bearded, PA as PCP - General (Family Medicine) Tammie Fall, MD as PCP - Electrophysiology (Cardiology) Emilie Harden, MD as Consulting Physician (Endocrinology) Ander Bame, MD as Consulting Physician (Hematology and Oncology)  Hematological/Oncological History # Follicular Lymphoma Stage IIIa 09/2008: diagnosed 08/25/2011: Bendamustine  and rituximab  for 6 cycles completed in 12/2011 and achieved complete response 09/21/2021: last visit with Tom Fuller 09/23/2022: establish care with Tom Fuller   Interval History:  Tom Fuller 73 y.o. male with medical history significant for follicular lymphoma who presents for a follow up visit. The patient's last visit was on 09/23/2022. In the interim since the last visit he has had no major changes in his health.  On exam today Tom Fuller reports he has been well overall in the interim since her last visit.  He is had no issues with flu, norovirus, or allergies from the pollen.  He reports that he does have some occasional leg cramps but has been eating pickles and eating mustard in order to try to help with these.  He reports that he has not noticed any bumps or lumps concerning for lymphadenopathy.  He also denies any bleeding, bruising, or dark stools.  His energy levels are strong and he is able to do all of his day-to-day activities without difficulty.  Overall he is at his baseline level of health and has no questions concerns or complaints today.  Full 10 point ROS is otherwise negative.  MEDICAL HISTORY:  Past Medical History:  Diagnosis Date   Atrial fibrillation (HCC)    Cervical lymphadenopathy    Erectile dysfunction    GERD (gastroesophageal reflux disease)    Hypertension    Lymphoma, follicular (HCC)    Tom Fuller   Retinal detachment     SURGICAL HISTORY: Past Surgical History:  Procedure Laterality  Date   a bsc     ABSCESS DRAINAGE     under rt eye    CATARACT EXTRACTION Right 05/2021   DEEP NECK LYMPH NODE BIOPSY / EXCISION  05/30/2009   VASECTOMY      SOCIAL HISTORY: Social History   Socioeconomic History   Marital status: Married    Spouse name: Not on file   Number of children: Not on file   Years of education: Not on file   Highest education level: Not on file  Occupational History   Not on file  Tobacco Use   Smoking status: Never    Passive exposure: Never   Smokeless tobacco: Never  Vaping Use   Vaping status: Never Used  Substance and Sexual Activity   Alcohol use: No   Drug use: No   Sexual activity: Yes    Partners: Female  Other Topics Concern   Not on file  Social History Narrative   Not on file   Social Drivers of Health   Financial Resource Strain: Not on file  Food Insecurity: Not on file  Transportation Needs: Not on file  Physical Activity: Not on file  Stress: Not on file  Social Connections: Not on file  Intimate Partner Violence: Not on file    FAMILY HISTORY: Family History  Problem Relation Age of Onset   Hypertension Mother    Kidney disease Mother    Hypertension Father    Hypertension Brother    Diabetes Paternal Grandfather    Hypertension Paternal Grandfather    Kidney disease Maternal  Aunt    Kidney disease Maternal Grandmother     ALLERGIES:  is allergic to ampicillin.  MEDICATIONS:  Current Outpatient Medications  Medication Sig Dispense Refill   Accu-Chek FastClix Lancets MISC USE AS DIRECTED 102 each 1   dapagliflozin  propanediol (FARXIGA ) 10 MG TABS tablet Take 1 tablet (10 mg total) by mouth daily. 90 tablet 3   ELIQUIS  5 MG TABS tablet TAKE 1 TABLET BY MOUTH TWICE A DAY 60 tablet 5   glucose blood (ACCU-CHEK GUIDE) test strip Use as instructed 100 strip 1   metFORMIN  (GLUCOPHAGE -XR) 500 MG 24 hr tablet TAKE 2 TABLETS BY MOUTH WITH A MEAL 180 tablet 3   metoprolol  tartrate (LOPRESSOR ) 25 MG tablet TAKE 1  TABLET BY MOUTH TWICE A DAY 180 tablet 3   pravastatin  (PRAVACHOL ) 20 MG tablet TAKE 1 TABLET BY MOUTH EVERY DAY (Patient not taking: Reported on 09/21/2023) 90 tablet 3   pravastatin  (PRAVACHOL ) 20 MG tablet Take 1 tablet (20 mg total) by mouth daily. 90 tablet 3   repaglinide  (PRANDIN ) 1 MG tablet Take 1 tablet (1 mg total) by mouth daily before supper. 90 tablet 3   Semaglutide , 1 MG/DOSE, (OZEMPIC , 1 MG/DOSE,) 4 MG/3ML SOPN INJECT  1 MG TOTAL INTO THE SKIN ONCE A WEEK. 9 mL 3   valsartan -hydrochlorothiazide  (DIOVAN -HCT) 320-12.5 MG tablet Take 1 tablet by mouth daily. 90 tablet 1   No current facility-administered medications for this visit.    REVIEW OF SYSTEMS:   Constitutional: ( - ) fevers, ( - )  chills , ( - ) night sweats Eyes: ( - ) blurriness of vision, ( - ) double vision, ( - ) watery eyes Ears, nose, mouth, throat, and face: ( - ) mucositis, ( - ) sore throat Respiratory: ( - ) cough, ( - ) dyspnea, ( - ) wheezes Cardiovascular: ( - ) palpitation, ( - ) chest discomfort, ( - ) lower extremity swelling Gastrointestinal:  ( - ) nausea, ( - ) heartburn, ( - ) change in bowel habits Skin: ( - ) abnormal skin rashes Lymphatics: ( - ) new lymphadenopathy, ( - ) easy bruising Neurological: ( - ) numbness, ( - ) tingling, ( - ) new weaknesses Behavioral/Psych: ( - ) mood change, ( - ) new changes  All other systems were reviewed with the patient and are negative.  PHYSICAL EXAMINATION:  Vitals:   09/22/23 1523  BP: 113/79  Pulse: 86  Resp: 15  Temp: (!) 97.4 F (36.3 C)  SpO2: 99%   Filed Weights   09/22/23 1523  Weight: 204 lb 9.6 oz (92.8 kg)    GENERAL: Well-appearing elderly Caucasian male, alert, no distress and comfortable SKIN: skin color, texture, turgor are normal, no rashes or significant lesions LYMPH: No adenopathy noted in the cervical, supraclavicular, or axillary lymph nodes EYES: conjunctiva are pink and non-injected, sclera clear LUNGS: clear to  auscultation and percussion with normal breathing effort HEART: regular rate & rhythm and no murmurs and no lower extremity edema Musculoskeletal: no cyanosis of digits and no clubbing  PSYCH: alert & oriented x 3, fluent speech NEURO: no focal motor/sensory deficits  LABORATORY DATA:  I have reviewed the data as listed    Latest Ref Rng & Units 09/22/2023    2:35 PM 08/23/2023    9:01 AM 09/23/2022    3:01 PM  CBC  WBC 4.0 - 10.5 K/uL 8.2  7.3  7.4   Hemoglobin 13.0 - 17.0 g/dL 09.8  11.9  14.5   Hematocrit 39.0 - 52.0 % 40.2  45.0  42.0   Platelets 150 - 400 K/uL 203  216  209        Latest Ref Rng & Units 09/22/2023    2:35 PM 08/23/2023    9:01 AM 09/23/2022    3:01 PM  CMP  Glucose 70 - 99 mg/dL 409  96  811   BUN 8 - 23 mg/dL 35  28  29   Creatinine 0.61 - 1.24 mg/dL 9.14  7.82  9.56   Sodium 135 - 145 mmol/L 140  141  138   Potassium 3.5 - 5.1 mmol/L 4.6  4.3  3.9   Chloride 98 - 111 mmol/L 106  102  103   CO2 22 - 32 mmol/L 29  23  28    Calcium 8.9 - 10.3 mg/dL 21.3  08.6  57.8   Total Protein 6.5 - 8.1 g/dL 6.6  6.6  6.7   Total Bilirubin 0.0 - 1.2 mg/dL 0.9  0.9  0.8   Alkaline Phos 38 - 126 U/L 64  73  57   AST 15 - 41 U/L 13  15  15    ALT 0 - 44 U/L 11  11  11      No results found for: "MPROTEIN" Lab Results  Component Value Date   KPAFRELGTCHN 0.64 08/15/2008   LAMBDASER 1.90 08/15/2008   KAPLAMBRATIO 0.34 08/15/2008    RADIOGRAPHIC STUDIES: No results found.  ASSESSMENT & PLAN GENTLE BOGLE 73 y.o. male with medical history significant for follicular lymphoma who presents for a follow up visit.  After review the labs, review the records, discussion with the patient the findings are most consistent with a follicular lymphoma currently in remission.  He was initially a stage IIIa treated with Bendamustine  and rituximab  in 2013.  He has not required any treatment since that time.  He is had no new signs or symptoms of recurrent disease.  At this time would  recommend once yearly observation as these indolent lymphomas may still recur.  The patient voices understanding of his disease process and the plan moving forward.  # Follicular Lymphoma Stage IIIa -- Patient is currently over 10 years out from his chemotherapy treatment. -- No need for routine imaging at this time. -- Labs today show white blood cell count 8.2, Hgb 14.3, MCV 91, Plt 203 -- Patient does not have any signs or symptoms concerning for recurrent lymphoma -- Plan to have the patient return to clinic in 1 years time unless you develop any new or worsening symptoms.  No orders of the defined types were placed in this encounter.   All questions were answered. The patient knows to call the clinic with any problems, questions or concerns.  A total of more than 25 minutes were spent on this encounter with face-to-face time and non-face-to-face time, including preparing to see the patient, ordering tests and/or medications, counseling the patient and coordination of care as outlined above.   Rogerio Clay, MD Department of Hematology/Oncology [facility] Phone: [phone] Pager: [phone] Email: [email or web address]  09/24/2023 7:25 PM

## 2023-12-07 ENCOUNTER — Other Ambulatory Visit (HOSPITAL_COMMUNITY): Payer: Self-pay

## 2023-12-07 ENCOUNTER — Other Ambulatory Visit: Payer: Self-pay | Admitting: *Deleted

## 2023-12-07 DIAGNOSIS — I480 Paroxysmal atrial fibrillation: Secondary | ICD-10-CM

## 2023-12-07 MED ORDER — APIXABAN 5 MG PO TABS: 5.0000 mg | ORAL_TABLET | Freq: Two times a day (BID) | ORAL | 5 refills | Status: AC

## 2023-12-07 MED ORDER — APIXABAN 5 MG PO TABS
5.0000 mg | ORAL_TABLET | Freq: Two times a day (BID) | ORAL | 5 refills | Status: DC
  Filled 2023-12-07: qty 60, 30d supply, fill #0

## 2023-12-07 NOTE — Addendum Note (Signed)
 Addended by: ROYDEN RAKE B on: 12/07/2023 09:08 AM   Modules accepted: Orders

## 2023-12-07 NOTE — Telephone Encounter (Signed)
 Prescription refill request for Eliquis  received.  Indication: afib  Last office visit: Tillery, 09/21/2023 Scr: 1.26, 09/22/2023 Age: 73 yo  Weight: 92.8 kg   Refill sent.

## 2023-12-23 ENCOUNTER — Other Ambulatory Visit: Payer: Self-pay | Admitting: Family Medicine

## 2023-12-23 DIAGNOSIS — E1159 Type 2 diabetes mellitus with other circulatory complications: Secondary | ICD-10-CM

## 2024-01-08 ENCOUNTER — Ambulatory Visit: Admitting: Internal Medicine

## 2024-01-08 ENCOUNTER — Encounter: Payer: Self-pay | Admitting: Internal Medicine

## 2024-01-08 VITALS — BP 120/78 | HR 96 | Ht 70.0 in | Wt 204.8 lb

## 2024-01-08 DIAGNOSIS — E1122 Type 2 diabetes mellitus with diabetic chronic kidney disease: Secondary | ICD-10-CM | POA: Diagnosis not present

## 2024-01-08 DIAGNOSIS — E663 Overweight: Secondary | ICD-10-CM | POA: Diagnosis not present

## 2024-01-08 DIAGNOSIS — Z7984 Long term (current) use of oral hypoglycemic drugs: Secondary | ICD-10-CM

## 2024-01-08 DIAGNOSIS — E785 Hyperlipidemia, unspecified: Secondary | ICD-10-CM

## 2024-01-08 DIAGNOSIS — N189 Chronic kidney disease, unspecified: Secondary | ICD-10-CM | POA: Diagnosis not present

## 2024-01-08 DIAGNOSIS — Z7985 Long-term (current) use of injectable non-insulin antidiabetic drugs: Secondary | ICD-10-CM

## 2024-01-08 LAB — POCT GLYCOSYLATED HEMOGLOBIN (HGB A1C): Hemoglobin A1C: 6.2 % — AB (ref 4.0–5.6)

## 2024-01-08 NOTE — Addendum Note (Signed)
 Addended by: CLEOTILDE ROLIN RAMAN on: 01/08/2024 04:15 PM   Modules accepted: Orders

## 2024-01-08 NOTE — Progress Notes (Signed)
 Patient ID: Tom Fuller, male   DOB: 04/11/1951, 73 y.o.   MRN: [id number]   HPI: Tom Fuller is a 73 y.o.-year-old male, presenting for follow-up for DM2, dx in 2017, non-insulin-dependent, uncontrolled, with complications (mild CKD, ED).  Last visit 4.5 months ago.  Interim history: No increased urination, no nausea, no chest pain.   He continues to have some blurry vision R eye (detached retina and cataract sx). He had increased stress with wife being sick.  Now in rehab after an ankle fracture.  Reviewed HbA1c levels: Lab Results  Component Value Date   HGBA1C 6.0 (H) 08/23/2023   HGBA1C 5.9 (A) 05/01/2023   HGBA1C 7.4 01/10/2023   HGBA1C 5.9 (A) 10/18/2022   HGBA1C 6.3 (H) 08/02/2022   HGBA1C 6.8 (A) 06/13/2022   HGBA1C 6.5 (A) 02/11/2022   HGBA1C 6.1 (A) 10/01/2021   HGBA1C 6.3 (A) 05/28/2021   HGBA1C 6.6 (H) 12/01/2020   Pt is on a regimen of: - Metformin  ER 500 mg with dinner and 500 mg at bedtime >> 1000 mg with lunch >> with dinner - Prandin  1 mg before dinner-started 12/2022 - Farxiga  10 mg before b'fast - Ozempic  0.5 mg weekly in am  - started 02/2018 >> 1 mg weekly He was on Glipizide  5 mg 2x a day before meals -started 01/2019 >> stopped after adding Ozempic  He stopped regular metformin  due to abdominal pain. We stopped Prandin  1 mg before dinner in 09/2022 due to good control.  Pt checks his sugars once a day: - am: 110-147, 166, 184 >> 88-141, 190, 233 >> 94-127 >> 92-135, 145, 150 - 2h after b'fast: n/c >> 119 >> n/c - before lunch: n/c >> 119 >> 123 >> n/c - 2h after lunch:  141 >> n/c >> 106-119, 144 >> n/c  - before dinner:  102-137 >> 80-120 >> 97-125, 133 >> 73-122 - 2h after dinner: 114-192 >> 99-114 >> 117 >> n/c >> 105 - bedtime:114, 139, 146 >> 134, 195 >> n/c >> 102 >> N/c - nighttime: n/c Lowest sugar was  80 >> 94 >> 73; he has hypoglycemia awareness in the 70s. Highest sugar was 346 >>...  363 (?) >> 235 >> 222 (>3 mo ago) >>  159.  Glucometer: Accu-Chek  Pt's meals are: - Breakfast: cereal (rice crispies) with milk, boiled eggs - Lunch: banana sandwich, whole wheat - Dinner: varies - Snacks: 2-3x Despite repeated advice about stopping orange juice (with b'fast - 12 oz) and milk (with dinner - 12 oz), he continues on these, but decreased the amount.  -+ Mild CKD, last BUN/creatinine:  Lab Results  Component Value Date   BUN 35 (H) 09/22/2023   BUN 28 (H) 08/23/2023   CREATININE 1.26 (H) 09/22/2023   CREATININE 1.17 08/23/2023   Lab Results  Component Value Date   MICRALBCREAT 20 09/01/2023   MICRALBCREAT 69 (H) 09/25/2019  On valsartan  320.  -+ HL; last set of lipids: Lab Results  Component Value Date   CHOL 99 (L) 08/23/2023   HDL 39 (L) 08/23/2023   LDLCALC 46 08/23/2023   TRIG 62 08/23/2023   CHOLHDL 2.5 08/23/2023  He was not on a statin >> we started Pravastatin  20 mg daily. He takes this now.  - last eye exam was in 10/2022: No DR, + cataract.  Before last visit he had an episode of diplopia, resolved. He had retinal detachment sx 03/2021. He had cataract sx. In 2023.  - no numbness and tingling in  his feet.  Last foot exam 01/12/2023 by Dr. Gershon.  He is not seeing him anymore.  Pt has FH of DM in PGF.  He has a history of follicular lymphoma, followed by oncology.  ROS: + see HPI  I reviewed pt's medications, allergies, PMH, social hx, family hx, and changes were documented in the history of present illness. Otherwise, unchanged from my initial visit note.  Past Medical History:  Diagnosis Date   Atrial fibrillation (HCC)    Cervical lymphadenopathy    Erectile dysfunction    GERD (gastroesophageal reflux disease)    Hypertension    Lymphoma, follicular (HCC)    Dr.Shadad   Retinal detachment    Past Surgical History:  Procedure Laterality Date   a bsc     ABSCESS DRAINAGE     under rt eye    CATARACT EXTRACTION Right 05/2021   DEEP NECK LYMPH NODE BIOPSY / EXCISION   05/30/2009   VASECTOMY     Social History   Socioeconomic History   Marital status: Married    Spouse name: Not on file   Number of children: 2  Occupational History    Saw Designer, television/film set  Tobacco Use   Smoking status: Never Smoker   Smokeless tobacco: Never Used  Substance and Sexual Activity   Alcohol use: No   Drug use: No   Sexual activity: Yes    Partners: Female   Current Outpatient Medications on File Prior to Visit  Medication Sig Dispense Refill   Accu-Chek FastClix Lancets MISC USE AS DIRECTED 102 each 1   apixaban  (ELIQUIS ) 5 MG TABS tablet Take 1 tablet (5 mg total) by mouth 2 (two) times daily. 60 tablet 5   dapagliflozin  propanediol (FARXIGA ) 10 MG TABS tablet Take 1 tablet (10 mg total) by mouth daily. 90 tablet 3   glucose blood (ACCU-CHEK GUIDE) test strip Use as instructed 100 strip 1   metFORMIN  (GLUCOPHAGE -XR) 500 MG 24 hr tablet TAKE 2 TABLETS BY MOUTH WITH A MEAL 180 tablet 3   metoprolol  tartrate (LOPRESSOR ) 25 MG tablet TAKE 1 TABLET BY MOUTH TWICE A DAY 180 tablet 3   pravastatin  (PRAVACHOL ) 20 MG tablet TAKE 1 TABLET BY MOUTH EVERY DAY (Patient not taking: Reported on 09/21/2023) 90 tablet 3   pravastatin  (PRAVACHOL ) 20 MG tablet Take 1 tablet (20 mg total) by mouth daily. 90 tablet 3   repaglinide  (PRANDIN ) 1 MG tablet Take 1 tablet (1 mg total) by mouth daily before supper. 90 tablet 3   Semaglutide , 1 MG/DOSE, (OZEMPIC , 1 MG/DOSE,) 4 MG/3ML SOPN INJECT  1 MG TOTAL INTO THE SKIN ONCE A WEEK. 9 mL 3   valsartan -hydrochlorothiazide  (DIOVAN -HCT) 320-12.5 MG tablet TAKE 1 TABLET BY MOUTH EVERY DAY 90 tablet 1   No current facility-administered medications on file prior to visit.   Allergies  Allergen Reactions   Ampicillin Rash   Family History  Problem Relation Age of Onset   Hypertension Mother    Kidney disease Mother    Hypertension Father    Hypertension Brother    Diabetes Paternal Grandfather    Hypertension Paternal Grandfather    Kidney  disease Maternal Aunt    Kidney disease Maternal Grandmother    PE: BP 120/78   Pulse 96   Ht 5' 10 (1.778 m)   Wt 204 lb 12.8 oz (92.9 kg)   SpO2 97%   BMI 29.39 kg/m  Wt Readings from Last 10 Encounters:  01/08/24 204 lb 12.8 oz (92.9 kg)  09/22/23 204 lb 9.6 oz (92.8 kg)  09/21/23 202 lb (91.6 kg)  09/08/23 204 lb 6.4 oz (92.7 kg)  08/28/23 203 lb (92.1 kg)  05/08/23 205 lb 3.2 oz (93.1 kg)  05/01/23 203 lb (92.1 kg)  02/24/23 200 lb 3.2 oz (90.8 kg)  01/10/23 201 lb (91.2 kg)  12/29/22 203 lb 1.9 oz (92.1 kg)   Constitutional: overweight, in NAD Eyes:  EOMI, no exophthalmos ENT: no neck masses, no cervical lymphadenopathy Cardiovascular: RRR, No MRG Respiratory: CTA B Musculoskeletal: no deformities Skin:no rashes Neurological: no tremor with outstretched hands Diabetic Foot Exam - Simple   Simple Foot Form Diabetic Foot exam was performed with the following findings: Yes 01/08/2024  3:40 PM  Visual Inspection No deformities, no ulcerations, no other skin breakdown bilaterally: Yes Sensation Testing Intact to touch and monofilament testing bilaterally: Yes Pulse Check Posterior Tibialis and Dorsalis pulse intact bilaterally: Yes Comments 1 cm Blister R 5th toe    ASSESSMENT: 1. DM2, non-insulin-dependent, now controlled, with long-term complications - mild CKD - ED  2. HL  3.  Overweight  PLAN:  1. Patient with longstanding, previously uncontrolled type 2 diabetes, with improved control in the last several years.  At last visit, HbA1c was slightly higher but still at goal, at 6.0%.  Almost all of his blood sugars were at goal.  We did not change his regimen. - At today's visit, sugars appear to be mainly at goal, but with an occasional hyperglycemic spike in the morning, especially around the time when his wife was in the hospital.  Sugars appear to have improved in the last 2 weeks.  For now, I did not suggest a change in regimen. -Farxiga  is expensive for  him.  I recommended to go online and print the manufactures coupon. - I suggested to:  Patient Instructions  Please continue: - Metformin  ER 1000 mg with dinner - Prandin  1 mg before dinner - Farxiga  10 mg before breakfast - Ozempic  1 mg weekly in am    Please return in 4 months with your sugar log.   - we checked his HbA1c: 6.2% (slightly higher, but still at goal) - advised to check sugars at different times of the day - 1x a day, rotating check times - advised for yearly eye exams >> he is UTD - return to clinic in 4 months  2. HL - Lipid panel was at goal with the exception of a low HDL: Lab Results  Component Value Date   CHOL 99 (L) 08/23/2023   HDL 39 (L) 08/23/2023   LDLCALC 46 08/23/2023   TRIG 62 08/23/2023   CHOLHDL 2.5 08/23/2023  - He continues pravastatin  20 mg daily without side effects  3.  Overweight - will continue the SGLT2 inhibitor and GLP-1 receptor agonist, which should also help with weight loss - Weight at last visit was approximately stable and it remains stable now.  Lela Fendt, MD PhD [facility] Endocrinology

## 2024-01-08 NOTE — Patient Instructions (Addendum)
 Please continue: - Metformin ER 1000 mg with dinner - Prandin 1 mg before dinner - Farxiga 10 mg before breakfast - Ozempic 1 mg weekly in am    Please return in 3-4 months with your sugar log.

## 2024-02-16 ENCOUNTER — Telehealth: Payer: Self-pay

## 2024-02-16 ENCOUNTER — Other Ambulatory Visit (HOSPITAL_COMMUNITY): Payer: Self-pay

## 2024-02-16 NOTE — Telephone Encounter (Signed)
 Pharmacy Patient Advocate Encounter  Received notification from [facility] that Prior Authorization for Ozempic  (1 MG/DOSE) 4MG /3ML pen-injectors has been CANCELLED due to Your PA has been resolved, no additional PA is required.

## 2024-02-16 NOTE — Telephone Encounter (Signed)
 Pharmacy Patient Advocate Encounter   Received notification from CoverMyMeds that prior authorization for Ozempic  (1 MG/DOSE) 4MG /3ML pen-injectors is required/requested.   Insurance verification completed.   The patient is insured through [facility] .   Per test claim: PA required; PA started via CoverMyMeds. KEY [id number] . Waiting for clinical questions to populate.

## 2024-02-27 ENCOUNTER — Encounter: Payer: Self-pay | Admitting: Family Medicine

## 2024-02-27 ENCOUNTER — Ambulatory Visit: Admitting: Family Medicine

## 2024-02-27 VITALS — BP 110/75 | HR 84 | Ht 70.0 in | Wt 204.4 lb

## 2024-02-27 DIAGNOSIS — E1122 Type 2 diabetes mellitus with diabetic chronic kidney disease: Secondary | ICD-10-CM | POA: Diagnosis not present

## 2024-02-27 DIAGNOSIS — M179 Osteoarthritis of knee, unspecified: Secondary | ICD-10-CM | POA: Insufficient documentation

## 2024-02-27 DIAGNOSIS — Z636 Dependent relative needing care at home: Secondary | ICD-10-CM | POA: Diagnosis not present

## 2024-02-27 DIAGNOSIS — N182 Chronic kidney disease, stage 2 (mild): Secondary | ICD-10-CM | POA: Diagnosis not present

## 2024-02-27 DIAGNOSIS — M1712 Unilateral primary osteoarthritis, left knee: Secondary | ICD-10-CM | POA: Diagnosis not present

## 2024-02-27 DIAGNOSIS — Z7985 Long-term (current) use of injectable non-insulin antidiabetic drugs: Secondary | ICD-10-CM

## 2024-02-27 DIAGNOSIS — Z7984 Long term (current) use of oral hypoglycemic drugs: Secondary | ICD-10-CM

## 2024-02-27 NOTE — Assessment & Plan Note (Signed)
-   Reports increased agitation, frustration, fatigue, and anhedonia related to caregiver stress for his wife, who is in a rehab facility. He feels his life has changed and he is restricted from doing activities he enjoys. Denies suicidality. - Counseled on signs/symptoms of depression. - Discussed non-pharmacologic options, including setting aside time for personal activities like walking. - Discussed option of talking with a therapist. Advised to call his insurance company for a list of covered providers or utilize his work-sponsored EAP. - Offered medication options for mood, which he declined at this time. Plan to reassess at next visit. He is aware he can contact us  sooner if he changes his mind.

## 2024-02-27 NOTE — Assessment & Plan Note (Signed)
-   Last A1c was 6.2 - Last diabetic eye exam on record is from 10/2022. Advised that he is due for his annual screening.

## 2024-02-27 NOTE — Progress Notes (Unsigned)
 Established Patient Office Visit  Subjective   Patient ID: Tom Fuller, male    DOB: Sep 29, 1950  Age: 73 y.o. MRN: [id number]  Chief Complaint  Patient presents with   Medical Management of Chronic Issues    HPI  Subjective - Intermittent knee aches. Occurs every now and then, not constant. Does not take any medication for it.  - Reports increased agitation, aggravation, and frustration,. Reports feeling tired and a lack of interest in usual activities, such as his evening walks, due to caregiver responsibilities.  Denies suicidal ideation.  Medications Current medications include Eliquis , Farxiga , metformin , metoprolol , pravastatin , repaglinide , and Ozempic .  PMH, PSH, FH, Social Hx PMHx: Diabetes mellitus type 2 (A1c 6.2 at last check), diabetic retinopathy, detached retina right eye (04/20/2021) followed by cataract surgery and a third procedure on the same eye. PSH: Right eye surgeries x3 for detached retina and cataract in 2022. FH: Not discussed. Social Hx: Wife recently sustained a left ankle fracture on 11/30/2023 requiring surgery with hardware placement and is currently in a rehab facility. He is acting as a caregiver which is causing significant stress. Reports increased agitation and frustration. Has access to an employee assistance program through work for therapy.  ROS MSK: Positive for intermittent knee pain. Psych: Positive for agitation, aggravation, fatigue, and anhedonia. Negative for suicidal ideation. Ophtho: Reports vision changes in the right eye post-surgeries.   The ASCVD Risk score (Arnett DK, et al., 2019) failed to calculate for the following reasons:   The valid total cholesterol range is 130 to 320 mg/dL  Health Maintenance Due  Topic Date Due   COVID-19 Vaccine (1) Never done   Zoster Vaccines- Shingrix (1 of 2) Never done   Pneumococcal Vaccine: 50+ Years (2 of 2 - PPSV23, PCV20, or PCV21) 09/14/2018   OPHTHALMOLOGY EXAM  11/22/2023       Objective:     BP 110/75   Pulse 84   Ht 5' 10 (1.778 m)   Wt 204 lb 6.4 oz (92.7 kg)   SpO2 100%   BMI 29.33 kg/m    Physical Exam Gen: alert, oriented Pulm: no respiratory distress Psych: pleasant affect   No results found for any visits on 02/27/24.      Assessment & Plan:   Primary osteoarthritis of left knee Assessment & Plan: - Reports intermittent aches in the knee. Takes no medication for it. Advised that NSAIDs (Advil, Aleve, aspirin, BC/Goody powder, Mobic) are contraindicated due to bleeding risk with Eliquis . Tylenol  is safe for pain. - Discussed topical options, including OTC Voltaren gel, which is safe for localized use on the knee. - Discussed option of a steroid knee injection if pain worsens. Counseled on risks and benefits, including potential for temporary blood sugar elevation (less than oral steroids) and acceleration of arthritis progression with repeated injections. - Patient can schedule a dedicated appointment for an injection or have it added to a future visit if time permits.   Caregiver stress Assessment & Plan: - Reports increased agitation, frustration, fatigue, and anhedonia related to caregiver stress for his wife, who is in a rehab facility. He feels his life has changed and he is restricted from doing activities he enjoys. Denies suicidality. - Counseled on signs/symptoms of depression. - Discussed non-pharmacologic options, including setting aside time for personal activities like walking. - Discussed option of talking with a therapist. Advised to call his insurance company for a list of covered providers or utilize his work-sponsored EAP. - Offered medication options for  mood, which he declined at this time. Plan to reassess at next visit. He is aware he can contact us  sooner if he changes his mind.   Type 2 diabetes mellitus with stage 2 chronic kidney disease, without long-term current use of insulin (HCC) Assessment & Plan: - Last  A1c was 6.2 - Last diabetic eye exam on record is from 10/2022. Advised that he is due for his annual screening.      Return in about 6 months (around 08/26/2024) for physical.    Toribio MARLA Slain, MD

## 2024-02-27 NOTE — Assessment & Plan Note (Signed)
-   Reports intermittent aches in the knee. Takes no medication for it. Advised that NSAIDs (Advil, Aleve, aspirin, BC/Goody powder, Mobic) are contraindicated due to bleeding risk with Eliquis . Tylenol  is safe for pain. - Discussed topical options, including OTC Voltaren gel, which is safe for localized use on the knee. - Discussed option of a steroid knee injection if pain worsens. Counseled on risks and benefits, including potential for temporary blood sugar elevation (less than oral steroids) and acceleration of arthritis progression with repeated injections. - Patient can schedule a dedicated appointment for an injection or have it added to a future visit if time permits.

## 2024-02-27 NOTE — Patient Instructions (Signed)
 It was nice to see you today,  We addressed the following topics today: -No changes to your medications today. - If you have not seen an eye doctor this year yet for diabetic retinopathy screening I would recommend seeing somebody. - If you feel like you would like to start taking a medication for depressed mood let us  know and I can talk to you about options.  If you would like to talk to a therapist the best place to start would be calling your insurance company to see who is covered by your insurance.  Have a great day,  Rolan Slain, MD

## 2024-04-30 ENCOUNTER — Encounter: Payer: Self-pay | Admitting: Internal Medicine

## 2024-04-30 ENCOUNTER — Ambulatory Visit: Admitting: Internal Medicine

## 2024-04-30 VITALS — BP 116/70 | HR 89 | Resp 18 | Ht 69.0 in | Wt 207.0 lb

## 2024-04-30 DIAGNOSIS — Z794 Long term (current) use of insulin: Secondary | ICD-10-CM

## 2024-04-30 DIAGNOSIS — E1151 Type 2 diabetes mellitus with diabetic peripheral angiopathy without gangrene: Secondary | ICD-10-CM

## 2024-04-30 DIAGNOSIS — E785 Hyperlipidemia, unspecified: Secondary | ICD-10-CM

## 2024-04-30 DIAGNOSIS — E66811 Obesity, class 1: Secondary | ICD-10-CM

## 2024-04-30 DIAGNOSIS — E1122 Type 2 diabetes mellitus with diabetic chronic kidney disease: Secondary | ICD-10-CM | POA: Diagnosis not present

## 2024-04-30 LAB — POCT GLYCOSYLATED HEMOGLOBIN (HGB A1C): Hemoglobin A1C: 6 % — AB (ref 4.0–5.6)

## 2024-04-30 MED ORDER — METFORMIN HCL ER 500 MG PO TB24: ORAL_TABLET | ORAL | 3 refills | Status: AC

## 2024-04-30 MED ORDER — OZEMPIC (1 MG/DOSE) 4 MG/3ML ~~LOC~~ SOPN: PEN_INJECTOR | SUBCUTANEOUS | 3 refills | Status: AC

## 2024-04-30 MED ORDER — REPAGLINIDE 1 MG PO TABS: 1.0000 mg | ORAL_TABLET | Freq: Every day | ORAL | 3 refills | Status: AC

## 2024-04-30 MED ORDER — DAPAGLIFLOZIN PROPANEDIOL 10 MG PO TABS: 10.0000 mg | ORAL_TABLET | Freq: Every day | ORAL | 3 refills | Status: AC

## 2024-04-30 NOTE — Progress Notes (Signed)
 Patient ID: Tom Fuller, male   DOB: 03/20/1951, 73 y.o.   MRN: [id number]   HPI: Tom Fuller is a 73 y.o.-year-old male, presenting for follow-up for DM2, dx in 2017, non-insulin-dependent, uncontrolled, with complications (mild CKD, ED).  Last visit 3.5 months ago.  Interim history: No increased urination, no nausea, no chest pain.   He continues to have some blurry vision R eye (detached retina and cataract sx).  He still did not schedule a new eye appointment as he was busy with his wife who continues to be quite sick.  She is now in an assisted living facility.  However her  Reviewed HbA1c levels: Lab Results  Component Value Date   HGBA1C 6.2 (A) 01/08/2024   HGBA1C 6.0 (H) 08/23/2023   HGBA1C 5.9 (A) 05/01/2023   HGBA1C 7.4 01/10/2023   HGBA1C 5.9 (A) 10/18/2022   HGBA1C 6.3 (H) 08/02/2022   HGBA1C 6.8 (A) 06/13/2022   HGBA1C 6.5 (A) 02/11/2022   HGBA1C 6.1 (A) 10/01/2021   HGBA1C 6.3 (A) 05/28/2021   Pt is on a regimen of: - Metformin  ER 500 mg with dinner and 500 mg at bedtime >> 1000 mg with lunch >> with dinner - Prandin  1 mg before dinner-started 12/2022 - Farxiga  10 mg before b'fast - Ozempic  0.5 mg weekly in am  - started 02/2018 >> 1 mg weekly He was on Glipizide  5 mg 2x a day before meals -started 01/2019 >> stopped after adding Ozempic  He stopped regular metformin  due to abdominal pain. We stopped Prandin  1 mg before dinner in 09/2022 due to good control.  Pt checks his sugars once a day: - am:  94-127 >> 92-135, 145, 150 >> 86, 95-136, 144 - 2h after b'fast: n/c >> 119 >> n/c - before lunch: n/c >> 119 >> 123 >> n/c - 2h after lunch:  141 >> n/c >> 106-119, 144 >> n/c  - before dinner: 80-120 >> 97-125, 133 >> 73-122 >> 92-136 - 2h after dinner: 99-114 >> 117 >> n/c >> 105 >> 101-135 - bedtime: 134, 195 >> n/c >> 102 >> N/c >> 111 - nighttime: n/c Lowest sugar was  80 >> 94 >> 73; he has hypoglycemia awareness in the 70s. Highest sugar was 363 (?) >> 235  >> 222 >> 159.  Glucometer: Accu-Chek  Pt's meals are: - Breakfast: cereal (rice crispies) with milk, boiled eggs - Lunch: banana sandwich, whole wheat - Dinner: varies - Snacks: 2-3x Despite repeated advice about stopping orange juice (with b'fast - 12 oz) and milk (with dinner - 12 oz), he continues on these, but decreased the amount.  -+ Mild CKD, last BUN/creatinine:  Lab Results  Component Value Date   BUN 35 (H) 09/22/2023   BUN 28 (H) 08/23/2023   CREATININE 1.26 (H) 09/22/2023   CREATININE 1.17 08/23/2023   Lab Results  Component Value Date   MICRALBCREAT 20 09/01/2023   MICRALBCREAT 69 (H) 09/25/2019  On valsartan  320.  -+ HL; last set of lipids: Lab Results  Component Value Date   CHOL 99 (L) 08/23/2023   HDL 39 (L) 08/23/2023   LDLCALC 46 08/23/2023   TRIG 62 08/23/2023   CHOLHDL 2.5 08/23/2023  He was not on a statin >> we started Pravastatin  20 mg daily. He takes this now.  - last eye exam was in 10/2022: No DR, + cataract.  Before last visit he had an episode of diplopia, resolved. He had retinal detachment sx 03/2021. He had cataract sx. In  2023. He had a third sx later the same year.  - no numbness and tingling in his feet.  He previously saw Dr. Gershon.  He is not seeing him anymore.  Last foot exam was here in clinic 01/08/2024.  Pt has FH of DM in PGF.  He has a history of follicular lymphoma, followed by oncology.  ROS: + see HPI  I reviewed pt's medications, allergies, PMH, social hx, family hx, and changes were documented in the history of present illness. Otherwise, unchanged from my initial visit note.  Past Medical History:  Diagnosis Date   Atrial fibrillation (HCC)    Cervical lymphadenopathy    Erectile dysfunction    GERD (gastroesophageal reflux disease)    Hypertension    Lymphoma, follicular (HCC)    Dr.Shadad   Retinal detachment    Past Surgical History:  Procedure Laterality Date   a bsc     ABSCESS DRAINAGE     under  rt eye    CATARACT EXTRACTION Right 05/2021   DEEP NECK LYMPH NODE BIOPSY / EXCISION  05/30/2009   VASECTOMY     Social History   Socioeconomic History   Marital status: Married    Spouse name: Not on file   Number of children: 2  Occupational History    Saw designer, television/film set  Tobacco Use   Smoking status: Never Smoker   Smokeless tobacco: Never Used  Substance and Sexual Activity   Alcohol use: No   Drug use: No   Sexual activity: Yes    Partners: Female   Current Outpatient Medications on File Prior to Visit  Medication Sig Dispense Refill   Accu-Chek FastClix Lancets MISC USE AS DIRECTED 102 each 1   apixaban  (ELIQUIS ) 5 MG TABS tablet Take 1 tablet (5 mg total) by mouth 2 (two) times daily. 60 tablet 5   dapagliflozin  propanediol (FARXIGA ) 10 MG TABS tablet Take 1 tablet (10 mg total) by mouth daily. 90 tablet 3   glucose blood (ACCU-CHEK GUIDE) test strip Use as instructed 100 strip 1   metFORMIN  (GLUCOPHAGE -XR) 500 MG 24 hr tablet TAKE 2 TABLETS BY MOUTH WITH A MEAL 180 tablet 3   metoprolol  tartrate (LOPRESSOR ) 25 MG tablet TAKE 1 TABLET BY MOUTH TWICE A DAY 180 tablet 3   pravastatin  (PRAVACHOL ) 20 MG tablet Take 1 tablet (20 mg total) by mouth daily. 90 tablet 3   repaglinide  (PRANDIN ) 1 MG tablet Take 1 tablet (1 mg total) by mouth daily before supper. 90 tablet 3   Semaglutide , 1 MG/DOSE, (OZEMPIC , 1 MG/DOSE,) 4 MG/3ML SOPN INJECT  1 MG TOTAL INTO THE SKIN ONCE A WEEK. 9 mL 3   valsartan -hydrochlorothiazide  (DIOVAN -HCT) 320-12.5 MG tablet TAKE 1 TABLET BY MOUTH EVERY DAY 90 tablet 1   No current facility-administered medications on file prior to visit.   Allergies  Allergen Reactions   Ampicillin Rash   Family History  Problem Relation Age of Onset   Hypertension Mother    Kidney disease Mother    Hypertension Father    Hypertension Brother    Diabetes Paternal Grandfather    Hypertension Paternal Grandfather    Kidney disease Maternal Aunt    Kidney disease  Maternal Grandmother    PE: BP 116/70   Pulse 89   Resp 18   Ht 5' 9 (1.753 m)   Wt 207 lb (93.9 kg)   SpO2 98%   BMI 30.57 kg/m  Wt Readings from Last 10 Encounters:  04/30/24 207 lb (93.9 kg)  02/27/24 204 lb 6.4 oz (92.7 kg)  01/08/24 204 lb 12.8 oz (92.9 kg)  09/22/23 204 lb 9.6 oz (92.8 kg)  09/21/23 202 lb (91.6 kg)  09/08/23 204 lb 6.4 oz (92.7 kg)  08/28/23 203 lb (92.1 kg)  05/08/23 205 lb 3.2 oz (93.1 kg)  05/01/23 203 lb (92.1 kg)  02/24/23 200 lb 3.2 oz (90.8 kg)   Constitutional: overweight, in NAD Eyes:  EOMI, no exophthalmos ENT: no neck masses, no cervical lymphadenopathy Cardiovascular: RRR, No MRG, + L>R leg swelling Respiratory: CTA B Musculoskeletal: no deformities Skin:no rashes Neurological: no tremor with outstretched hands  ASSESSMENT: 1. DM2, non-insulin-dependent, now controlled, with long-term complications - mild CKD - ED  2. HL  3.  Obesity class I  PLAN:  1. Patient with longstanding, previously uncontrolled type 2 diabetes, with improved control in the last several years.  At last visit most of the blood sugars were not at goal and his HbA1c was still excellent, only slightly higher than before, at 6.2%.  He mentioned higher blood sugars around the time when his wife was in the hospital with a fractured ankle.  I did not suggest a change in regimen.  He did not mention Farxiga  being expensive for him and I advised him to go online and print the manufactures coupon.  - At today's visit, the vast majority of his blood sugars are at goal.  He did have a few very mildly elevated blood sugars in the morning, most likely due to the holidays, but no need to change his regimen for now.  I refilled all of his medications. - I suggested to:  Patient Instructions  Please continue: - Metformin  ER 1000 mg with dinner - Prandin  1 mg before dinner - Farxiga  10 mg before breakfast - Ozempic  1 mg weekly in am    Please return in 4-6 months with your  sugar log.   - we checked his HbA1c: 6.0% (slightly lower) - advised to check sugars at different times of the day - 1x a day, rotating check times - again advised for yearly eye exams >> he is not UTD - return to clinic in 4-6 months  2. HL - Latest lipid panel showed fractions at goal except for a slightly low HDL: Lab Results  Component Value Date   CHOL 99 (L) 08/23/2023   HDL 39 (L) 08/23/2023   LDLCALC 46 08/23/2023   TRIG 62 08/23/2023   CHOLHDL 2.5 08/23/2023  - He continues pravastatin  20 mg daily without side effects  3.  Obesity class I - will continue the SGLT2 inhibitor and GLP-1 receptor agonist, which should also help with weight loss - Weight was stable at last 2 visits and he gained 3 pounds since last visit.  Lela Fendt, MD PhD [facility]

## 2024-04-30 NOTE — Patient Instructions (Addendum)
 Please continue: - Metformin  ER 1000 mg with dinner - Prandin  1 mg before dinner - Farxiga  10 mg before breakfast - Ozempic  1 mg weekly in am    Please return in 4-6 months with your sugar log.

## 2024-05-11 ENCOUNTER — Other Ambulatory Visit: Payer: Self-pay | Admitting: Family Medicine

## 2024-05-16 MED ORDER — MUPIROCIN 2 % EX OINT: 1.0000 | TOPICAL_OINTMENT | Freq: Two times a day (BID) | CUTANEOUS | 2 refills | Status: AC

## 2024-08-26 ENCOUNTER — Other Ambulatory Visit

## 2024-09-02 ENCOUNTER — Encounter: Admitting: Family Medicine

## 2024-09-27 ENCOUNTER — Other Ambulatory Visit

## 2024-09-27 ENCOUNTER — Ambulatory Visit: Admitting: Hematology and Oncology

## 2024-11-01 ENCOUNTER — Ambulatory Visit: Admitting: Internal Medicine
